# Patient Record
Sex: Female | Born: 1950 | Race: Black or African American | Hispanic: No | Smoking: Never smoker
Health system: Southern US, Community
[De-identification: ages and names within clinical notes are randomized; demographics above are authoritative.]

## PROBLEM LIST (undated history)

## (undated) DIAGNOSIS — I10 Essential (primary) hypertension: Secondary | ICD-10-CM

## (undated) DIAGNOSIS — K635 Polyp of colon: Secondary | ICD-10-CM

## (undated) DIAGNOSIS — R011 Cardiac murmur, unspecified: Secondary | ICD-10-CM

## (undated) DIAGNOSIS — C801 Malignant (primary) neoplasm, unspecified: Secondary | ICD-10-CM

## (undated) DIAGNOSIS — K5792 Diverticulitis of intestine, part unspecified, without perforation or abscess without bleeding: Secondary | ICD-10-CM

## (undated) DIAGNOSIS — K921 Melena: Secondary | ICD-10-CM

## (undated) HISTORY — DX: Polyp of colon: K63.5

## (undated) HISTORY — DX: Malignant (primary) neoplasm, unspecified: C80.1

## (undated) HISTORY — DX: Melena: K92.1

## (undated) HISTORY — DX: Essential (primary) hypertension: I10

## (undated) HISTORY — DX: Diverticulitis of intestine, part unspecified, without perforation or abscess without bleeding: K57.92

## (undated) HISTORY — DX: Cardiac murmur, unspecified: R01.1

---

## 1991-03-09 HISTORY — PX: ABDOMINAL HYSTERECTOMY: SHX81

## 2003-03-09 DIAGNOSIS — C801 Malignant (primary) neoplasm, unspecified: Secondary | ICD-10-CM

## 2003-03-09 HISTORY — DX: Malignant (primary) neoplasm, unspecified: C80.1

## 2003-03-09 HISTORY — PX: OTHER SURGICAL HISTORY: SHX169

## 2003-10-22 ENCOUNTER — Other Ambulatory Visit: Payer: Self-pay

## 2006-08-10 ENCOUNTER — Ambulatory Visit: Payer: Self-pay | Admitting: Family Medicine

## 2007-08-14 ENCOUNTER — Ambulatory Visit: Payer: Self-pay | Admitting: Family Medicine

## 2008-10-24 ENCOUNTER — Ambulatory Visit: Payer: Self-pay | Admitting: Family Medicine

## 2009-07-06 LAB — HM MAMMOGRAPHY: HM Mammogram: NORMAL

## 2009-10-27 ENCOUNTER — Ambulatory Visit: Payer: Self-pay | Admitting: Family Medicine

## 2011-09-06 ENCOUNTER — Ambulatory Visit (INDEPENDENT_AMBULATORY_CARE_PROVIDER_SITE_OTHER): Payer: BC Managed Care – PPO | Admitting: Internal Medicine

## 2011-09-06 ENCOUNTER — Encounter: Payer: Self-pay | Admitting: Internal Medicine

## 2011-09-06 VITALS — BP 140/90 | HR 79 | Temp 98.7°F | Ht 63.5 in | Wt 186.2 lb

## 2011-09-06 DIAGNOSIS — Z85048 Personal history of other malignant neoplasm of rectum, rectosigmoid junction, and anus: Secondary | ICD-10-CM | POA: Insufficient documentation

## 2011-09-06 DIAGNOSIS — Z0000 Encounter for general adult medical examination without abnormal findings: Secondary | ICD-10-CM

## 2011-09-06 DIAGNOSIS — G4700 Insomnia, unspecified: Secondary | ICD-10-CM | POA: Insufficient documentation

## 2011-09-06 DIAGNOSIS — Z1239 Encounter for other screening for malignant neoplasm of breast: Secondary | ICD-10-CM

## 2011-09-06 DIAGNOSIS — I10 Essential (primary) hypertension: Secondary | ICD-10-CM

## 2011-09-06 LAB — COMPREHENSIVE METABOLIC PANEL
Albumin: 4.4 g/dL (ref 3.5–5.2)
Alkaline Phosphatase: 45 U/L (ref 39–117)
BUN: 13 mg/dL (ref 6–23)
CO2: 28 mEq/L (ref 19–32)
Calcium: 9.7 mg/dL (ref 8.4–10.5)
GFR: 94.42 mL/min (ref >60.00)
Glucose, Bld: 103 mg/dL — ABNORMAL HIGH (ref 70–99)
Potassium: 3.8 mEq/L (ref 3.5–5.1)

## 2011-09-06 LAB — CBC WITH DIFFERENTIAL/PLATELET
Basophils Absolute: 0 10*3/uL (ref 0.0–0.1)
HCT: 44 % (ref 36.0–46.0)
Hemoglobin: 14.7 g/dL (ref 12.0–15.0)
Lymphs Abs: 3.3 10*3/uL (ref 0.7–4.0)
MCHC: 33.3 g/dL (ref 30.0–36.0)
MCV: 93.2 fl (ref 78.0–100.0)
Monocytes Absolute: 0.2 10*3/uL (ref 0.1–1.0)
Neutro Abs: 2.7 10*3/uL (ref 1.4–7.7)
Platelets: 239 10*3/uL (ref 150.0–400.0)
RDW: 13.9 % (ref 11.5–14.6)

## 2011-09-06 LAB — LIPID PANEL
Cholesterol: 224 mg/dL — ABNORMAL HIGH (ref 0–200)
Total CHOL/HDL Ratio: 2
Triglycerides: 87 mg/dL (ref 0.0–149.0)

## 2011-09-06 LAB — MICROALBUMIN / CREATININE URINE RATIO: Microalb, Ur: 0.4 mg/dL (ref 0.0–1.9)

## 2011-09-06 MED ORDER — AMLODIPINE-OLMESARTAN 5-40 MG PO TABS: 1.0000 | ORAL_TABLET | Freq: Every day | ORAL | Status: DC

## 2011-09-06 NOTE — Assessment & Plan Note (Signed)
Blood pressure slightly elevated today. We'll plan to continue amlodipine and omesartan for now. We'll check renal function with labs. Patient will followup in one month.

## 2011-09-06 NOTE — Assessment & Plan Note (Signed)
Encouraged increased physical activity during the day to help with sleep at night. Discussed potentially adding medication, but she would like to defer this for now.

## 2011-09-06 NOTE — Progress Notes (Signed)
Subjective:    Patient ID: Diamond Hill, female    DOB: January 28, 1951, 61 y.o.   MRN: [id number]  HPI 61 year old female with history of rectal cancer status post surgical resection and radiation therapy, hypertension, and insomnia presents to establish care. She reports that she is generally feeling well. She continues to struggle with difficulty falling asleep at night, but is working on improving relaxation skills to help with this. She had taken medication for sleep on one occasion in the past but prefer not to use medications to help with sleep.  In regards to her history of rectal cancer, she reports she is status post surgical resection and radiation therapy. She notes that she suffered from significant dermatitis after radiation therapy. However, at this point she is cancer free. She notes her last colonoscopy was 2012 was normal. She is followed on a routine basis by her oncologist at [facility]. She denies any incontinence of stool or pain with bowel movements.  In regards to her history of hypertension, she reports full compliance with her medication. She denies any noted side effects from the medicine.  Outpatient Encounter Prescriptions as of 09/06/2011  Medication Sig Dispense Refill  . amLODipine-olmesartan (AZOR) 5-40 MG per tablet Take 1 tablet by mouth daily.  30 tablet  6    Review of Systems  Constitutional: Negative for fever, chills, appetite change, fatigue and unexpected weight change.  HENT: Negative for ear pain, congestion, sore throat, trouble swallowing, neck pain, voice change and sinus pressure.   Eyes: Negative for visual disturbance.  Respiratory: Negative for cough, shortness of breath, wheezing and stridor.   Cardiovascular: Negative for chest pain, palpitations and leg swelling.  Gastrointestinal: Negative for nausea, vomiting, abdominal pain, diarrhea, constipation, blood in stool, abdominal distention and anal bleeding.  Genitourinary: Negative for  dysuria and flank pain.  Musculoskeletal: Negative for myalgias, arthralgias and gait problem.  Skin: Negative for color change and rash.  Neurological: Negative for dizziness and headaches.  Hematological: Negative for adenopathy. Does not bruise/bleed easily.  Psychiatric/Behavioral: Positive for disturbed wake/sleep cycle. Negative for suicidal ideas and dysphoric mood. The patient is not nervous/anxious.    BP 140/90  Pulse 79  Temp 98.7 F (37.1 C) (Oral)  Ht 5' 3.5" (1.613 m)  Wt 186 lb 4 oz (84.482 kg)  BMI 32.48 kg/m2  SpO2 98%     Objective:   Physical Exam  Constitutional: She is oriented to person, place, and time. She appears well-developed and well-nourished. No distress.  HENT:  Head: Normocephalic and atraumatic.  Right Ear: External ear normal.  Left Ear: External ear normal.  Nose: Nose normal.  Mouth/Throat: Oropharynx is clear and moist. No oropharyngeal exudate.  Eyes: Conjunctivae are normal. Pupils are equal, round, and reactive to light. Right eye exhibits no discharge. Left eye exhibits no discharge. No scleral icterus.  Neck: Normal range of motion. Neck supple. No tracheal deviation present. No thyromegaly present.  Cardiovascular: Normal rate, regular rhythm, normal heart sounds and intact distal pulses.  Exam reveals no gallop and no friction rub.   No murmur heard. Pulmonary/Chest: Effort normal and breath sounds normal. No respiratory distress. She has no wheezes. She has no rales. She exhibits no tenderness.  Abdominal: Soft. Bowel sounds are normal. She exhibits no distension and no mass. There is no tenderness. There is no guarding.  Musculoskeletal: Normal range of motion. She exhibits no edema and no tenderness.  Lymphadenopathy:    She has no cervical adenopathy.  Neurological: She is  alert and oriented to person, place, and time. No cranial nerve deficit. She exhibits normal muscle tone. Coordination normal.  Skin: Skin is warm and dry. No rash  noted. She is not diaphoretic. No erythema. No pallor.  Psychiatric: She has a normal mood and affect. Her behavior is normal. Judgment and thought content normal.          Assessment & Plan:

## 2011-09-07 DIAGNOSIS — C20 Malignant neoplasm of rectum: Secondary | ICD-10-CM | POA: Insufficient documentation

## 2011-09-08 ENCOUNTER — Ambulatory Visit: Payer: Self-pay | Admitting: Internal Medicine

## 2011-09-15 ENCOUNTER — Encounter: Payer: Self-pay | Admitting: Internal Medicine

## 2011-10-12 ENCOUNTER — Encounter: Payer: Self-pay | Admitting: Internal Medicine

## 2011-10-12 ENCOUNTER — Ambulatory Visit (INDEPENDENT_AMBULATORY_CARE_PROVIDER_SITE_OTHER): Payer: BC Managed Care – PPO | Admitting: Internal Medicine

## 2011-10-12 ENCOUNTER — Other Ambulatory Visit (HOSPITAL_COMMUNITY)
Admission: RE | Admit: 2011-10-12 | Discharge: 2011-10-12 | Disposition: A | Discharge status: Home / Self Care | Payer: BC Managed Care – PPO | Source: Ambulatory Visit | Attending: Internal Medicine | Admitting: Internal Medicine

## 2011-10-12 VITALS — BP 140/80 | HR 74 | Temp 98.7°F | Ht 63.5 in | Wt 185.2 lb

## 2011-10-12 DIAGNOSIS — Z124 Encounter for screening for malignant neoplasm of cervix: Secondary | ICD-10-CM

## 2011-10-12 DIAGNOSIS — Z1159 Encounter for screening for other viral diseases: Secondary | ICD-10-CM | POA: Insufficient documentation

## 2011-10-12 DIAGNOSIS — R87810 Cervical high risk human papillomavirus (HPV) DNA test positive: Secondary | ICD-10-CM | POA: Insufficient documentation

## 2011-10-12 DIAGNOSIS — Z0000 Encounter for general adult medical examination without abnormal findings: Secondary | ICD-10-CM

## 2011-10-12 DIAGNOSIS — Z01419 Encounter for gynecological examination (general) (routine) without abnormal findings: Secondary | ICD-10-CM | POA: Insufficient documentation

## 2011-10-12 NOTE — Assessment & Plan Note (Addendum)
General exam including breast and pelvic exam are normal today. Pap is pending. Health maintenance is up to date with the exception of tetanus shot, which patient declines today. Reviewed recent lab work including lipid profile which was normal. Encourage continued efforts at healthy diet and regular physical activity including walking. Patient will followup in 6 months or sooner as needed.

## 2011-10-12 NOTE — Progress Notes (Signed)
Subjective:    Patient ID: Diamond Hill, female    DOB: Jan 28, 1951, 61 y.o.   MRN: [id number]  HPI 61 year old female with history of hypertension presents for annual exam. She reports she has been doing well. She has been walking approximately 2 miles per day. She has been trying to make healthy diet choices. She reports full compliance with her medication. She has no new concerns today.  Outpatient Encounter Prescriptions as of 10/12/2011  Medication Sig Dispense Refill  . amLODipine-olmesartan (AZOR) 5-40 MG per tablet Take 1 tablet by mouth daily.  30 tablet  6   BP 140/80  Pulse 74  Temp 98.7 F (37.1 C) (Oral)  Ht 5' 3.5" (1.613 m)  Wt 185 lb 4 oz (84.029 kg)  BMI 32.30 kg/m2  SpO2 99%  Review of Systems  Constitutional: Negative for fever, chills, appetite change, fatigue and unexpected weight change.  HENT: Negative for ear pain, congestion, sore throat, trouble swallowing, neck pain, voice change and sinus pressure.   Eyes: Negative for visual disturbance.  Respiratory: Negative for cough, shortness of breath, wheezing and stridor.   Cardiovascular: Negative for chest pain, palpitations and leg swelling.  Gastrointestinal: Negative for nausea, vomiting, abdominal pain, diarrhea, constipation, blood in stool, abdominal distention and anal bleeding.  Genitourinary: Negative for dysuria and flank pain.  Musculoskeletal: Negative for myalgias, arthralgias and gait problem.  Skin: Negative for color change and rash.  Neurological: Negative for dizziness and headaches.  Hematological: Negative for adenopathy. Does not bruise/bleed easily.  Psychiatric/Behavioral: Negative for suicidal ideas, disturbed wake/sleep cycle and dysphoric mood. The patient is not nervous/anxious.        Objective:   Physical Exam  Constitutional: She is oriented to person, place, and time. She appears well-developed and well-nourished. No distress.  HENT:  Head: Normocephalic and atraumatic.    Right Ear: External ear normal.  Left Ear: External ear normal.  Nose: Nose normal.  Mouth/Throat: Oropharynx is clear and moist. No oropharyngeal exudate.  Eyes: Conjunctivae are normal. Pupils are equal, round, and reactive to light. Right eye exhibits no discharge. Left eye exhibits no discharge. No scleral icterus.  Neck: Normal range of motion. Neck supple. No tracheal deviation present. No thyromegaly present.  Cardiovascular: Normal rate, regular rhythm, normal heart sounds and intact distal pulses.  Exam reveals no gallop and no friction rub.   No murmur heard. Pulmonary/Chest: Effort normal and breath sounds normal. No respiratory distress. She has no wheezes. She has no rales. She exhibits no tenderness.  Abdominal: Soft. Bowel sounds are normal. She exhibits no distension and no mass. There is no tenderness. There is no rebound and no guarding.  Genitourinary: Rectum normal. No breast swelling, tenderness, discharge or bleeding. Pelvic exam was performed with patient supine. There is no rash, tenderness or lesion on the right labia. There is no rash, tenderness or lesion on the left labia. Right adnexum displays no mass, no tenderness and no fullness. Left adnexum displays no mass, no tenderness and no fullness. No erythema or tenderness around the vagina. Vaginal discharge found.       Uterus and cervix surgically absent  Musculoskeletal: Normal range of motion. She exhibits no edema and no tenderness.  Lymphadenopathy:    She has no cervical adenopathy.  Neurological: She is alert and oriented to person, place, and time. No cranial nerve deficit. She exhibits normal muscle tone. Coordination normal.  Skin: Skin is warm and dry. No rash noted. She is not diaphoretic. No erythema. No pallor.  Psychiatric: She has a normal mood and affect. Her behavior is normal. Judgment and thought content normal.          Assessment & Plan:

## 2011-10-14 ENCOUNTER — Encounter: Payer: BC Managed Care – PPO | Admitting: Internal Medicine

## 2012-02-22 ENCOUNTER — Telehealth: Payer: Self-pay | Admitting: Internal Medicine

## 2012-02-22 MED ORDER — OSELTAMIVIR PHOSPHATE 75 MG PO CAPS: 75.0000 mg | ORAL_CAPSULE | Freq: Two times a day (BID) | ORAL | Status: DC

## 2012-02-22 NOTE — Telephone Encounter (Signed)
Please advise 

## 2012-02-22 NOTE — Telephone Encounter (Signed)
Patient Information:  Caller Name: [facility]  Phone: [phone]  Patient: Diamond, Hill  Gender: Female  DOB: [birth year]  Age: 61 Years  PCP: Ronna Polio (Adults only)  Office Follow Up:  Does the office need to follow up with this patient?: Yes  Instructions For The Office: Flu symptoms; requests tamiflu or appt; no appts available krs/can  RN Note:  Per protocol, emergent symptoms denied; patient requesting Tamiflu or appt.  No appts available per Epic; info to office for provider review/Rx/callback.  Uses [facility].  May reach patient [phone] krs/can  Symptoms  Reason For Call & Symptoms: flu; temp to 102, chills, cough, body aches  Reviewed Health History In EMR: Yes  Reviewed Medications In EMR: Yes  Reviewed Allergies In EMR: Yes  Reviewed Surgeries / Procedures: Yes  Date of Onset of Symptoms: 02/21/2012  Any Fever: Yes  Fever Taken: Oral  Fever Time Of Reading: 10:00:00  Fever Last Reading: 101.8  Guideline(s) Used:  Influenza - Seasonal  Disposition Per Guideline:   Discuss with PCP and Callback by Nurse Today  Reason For Disposition Reached:   Patient requests antiviral medicine for influenza and flu symptoms present < 48 hours  Advice Given:  N/A

## 2012-02-22 NOTE — Telephone Encounter (Signed)
Pt notified and advised to make an appt if symptoms persist.

## 2012-02-22 NOTE — Telephone Encounter (Signed)
I have called in [place] given that symptoms are consistent with flu, and we have no open appointments. Pt will need to be seen later this week if symptoms are persistent. If this is flu, Tamiflu should shorten the course and reduce risk of transmission to others.

## 2012-04-13 ENCOUNTER — Encounter: Payer: Self-pay | Admitting: Internal Medicine

## 2012-04-13 ENCOUNTER — Ambulatory Visit (INDEPENDENT_AMBULATORY_CARE_PROVIDER_SITE_OTHER): Payer: BC Managed Care – PPO | Admitting: Internal Medicine

## 2012-04-13 VITALS — BP 160/98 | HR 76 | Temp 98.3°F | Wt 185.0 lb

## 2012-04-13 DIAGNOSIS — F419 Anxiety disorder, unspecified: Secondary | ICD-10-CM

## 2012-04-13 DIAGNOSIS — IMO0002 Reserved for concepts with insufficient information to code with codable children: Secondary | ICD-10-CM

## 2012-04-13 DIAGNOSIS — I10 Essential (primary) hypertension: Secondary | ICD-10-CM

## 2012-04-13 DIAGNOSIS — R6889 Other general symptoms and signs: Secondary | ICD-10-CM

## 2012-04-13 DIAGNOSIS — B977 Papillomavirus as the cause of diseases classified elsewhere: Secondary | ICD-10-CM

## 2012-04-13 DIAGNOSIS — F411 Generalized anxiety disorder: Secondary | ICD-10-CM

## 2012-04-13 DIAGNOSIS — F32A Depression, unspecified: Secondary | ICD-10-CM | POA: Insufficient documentation

## 2012-04-13 LAB — COMPREHENSIVE METABOLIC PANEL
Albumin: 4.1 g/dL (ref 3.5–5.2)
BUN: 11 mg/dL (ref 6–23)
CO2: 28 mEq/L (ref 19–32)
Calcium: 9 mg/dL (ref 8.4–10.5)
Chloride: 104 mEq/L (ref 96–112)
Glucose, Bld: 83 mg/dL (ref 70–99)
Potassium: 3.7 mEq/L (ref 3.5–5.1)

## 2012-04-13 NOTE — Assessment & Plan Note (Signed)
Symptoms of mild anxiety related to interactions with pt daughter. Offered support today. Follow up prn.

## 2012-04-13 NOTE — Assessment & Plan Note (Signed)
Discussed PAP and HPV testing. Plan for repeat PAP in 10/2012.

## 2012-04-13 NOTE — Assessment & Plan Note (Signed)
BP elevated today. 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 Compliant with meds. Will recheck BP in 1 week. Check renal function today with labs.  Follow up for physical in 10/2012.

## 2012-04-13 NOTE — Progress Notes (Signed)
  Subjective:    Patient ID: Diamond Hill, female    DOB: [birth year], 62 y.o.   MRN: [id number]  HPI 62YO female with HTN presents for follow up. Doing well. Notes some increased anxiety recently with interaction with her daughter. Coping well. Exercising several times per week using elliptical. No recent headache, chest pain, palp. Compliant with meds. BP has been well controlled when checked by nurse at her office.  Outpatient Encounter Prescriptions as of 04/13/2012  Medication Sig Dispense Refill  . amLODipine-olmesartan (AZOR) 5-40 MG per tablet Take 1 tablet by mouth daily.  30 tablet  6  . [DISCONTINUED] oseltamivir (TAMIFLU) 75 MG capsule Take 1 capsule (75 mg total) by mouth 2 (two) times daily.  10 capsule  0   BP 160/98  Pulse 76  Temp 98.3 F (36.8 C) (Oral)  Wt 185 lb (83.915 kg)  SpO2 98%  Review of Systems  Constitutional: Negative for fever, chills, appetite change, fatigue and unexpected weight change.  HENT: Negative for ear pain, congestion, sore throat, trouble swallowing, neck pain, voice change and sinus pressure.   Eyes: Negative for visual disturbance.  Respiratory: Negative for cough, shortness of breath, wheezing and stridor.   Cardiovascular: Negative for chest pain, palpitations and leg swelling.  Gastrointestinal: Negative for nausea, vomiting, abdominal pain, diarrhea, constipation, blood in stool, abdominal distention and anal bleeding.  Genitourinary: Negative for dysuria and flank pain.  Musculoskeletal: Negative for myalgias, arthralgias and gait problem.  Skin: Negative for color change and rash.  Neurological: Negative for dizziness and headaches.  Hematological: Negative for adenopathy. Does not bruise/bleed easily.  Psychiatric/Behavioral: Negative for suicidal ideas, sleep disturbance and dysphoric mood. The patient is nervous/anxious.        Objective:   Physical Exam  Constitutional: She is oriented to person, place, and time. She appears  well-developed and well-nourished. No distress.  HENT:  Head: Normocephalic and atraumatic.  Right Ear: External ear normal.  Left Ear: External ear normal.  Nose: Nose normal.  Mouth/Throat: Oropharynx is clear and moist. No oropharyngeal exudate.  Eyes: Conjunctivae normal are normal. Pupils are equal, round, and reactive to light. Right eye exhibits no discharge. Left eye exhibits no discharge. No scleral icterus.  Neck: Normal range of motion. Neck supple. No tracheal deviation present. No thyromegaly present.  Cardiovascular: Normal rate, regular rhythm, normal heart sounds and intact distal pulses.  Exam reveals no gallop and no friction rub.   No murmur heard. Pulmonary/Chest: Effort normal and breath sounds normal. No respiratory distress. She has no wheezes. She has no rales. She exhibits no tenderness.  Musculoskeletal: Normal range of motion. She exhibits no edema and no tenderness.  Lymphadenopathy:    She has no cervical adenopathy.  Neurological: She is alert and oriented to person, place, and time. No cranial nerve deficit. She exhibits normal muscle tone. Coordination normal.  Skin: Skin is warm and dry. No rash noted. She is not diaphoretic. No erythema. No pallor.  Psychiatric: She has a normal mood and affect. Her behavior is normal. Judgment and thought content normal.          Assessment & Plan:

## 2012-04-18 ENCOUNTER — Other Ambulatory Visit: Payer: Self-pay | Admitting: *Deleted

## 2012-04-18 DIAGNOSIS — I10 Essential (primary) hypertension: Secondary | ICD-10-CM

## 2012-04-18 MED ORDER — AMLODIPINE-OLMESARTAN 5-40 MG PO TABS: 1.0000 | ORAL_TABLET | Freq: Every day | ORAL | Status: DC

## 2012-04-22 ENCOUNTER — Other Ambulatory Visit: Payer: Self-pay

## 2012-10-12 ENCOUNTER — Encounter: Payer: Self-pay | Admitting: Internal Medicine

## 2012-10-12 ENCOUNTER — Ambulatory Visit (INDEPENDENT_AMBULATORY_CARE_PROVIDER_SITE_OTHER): Payer: BC Managed Care – PPO | Admitting: Internal Medicine

## 2012-10-12 ENCOUNTER — Other Ambulatory Visit (HOSPITAL_COMMUNITY)
Admission: RE | Admit: 2012-10-12 | Discharge: 2012-10-12 | Disposition: A | Discharge status: Home / Self Care | Payer: BC Managed Care – PPO | Source: Ambulatory Visit | Attending: Internal Medicine | Admitting: Internal Medicine

## 2012-10-12 VITALS — BP 150/106 | HR 75 | Temp 98.1°F | Ht 63.5 in | Wt 187.0 lb

## 2012-10-12 DIAGNOSIS — B977 Papillomavirus as the cause of diseases classified elsewhere: Secondary | ICD-10-CM

## 2012-10-12 DIAGNOSIS — R6889 Other general symptoms and signs: Secondary | ICD-10-CM

## 2012-10-12 DIAGNOSIS — Z01419 Encounter for gynecological examination (general) (routine) without abnormal findings: Secondary | ICD-10-CM | POA: Insufficient documentation

## 2012-10-12 DIAGNOSIS — Z23 Encounter for immunization: Secondary | ICD-10-CM

## 2012-10-12 DIAGNOSIS — IMO0002 Reserved for concepts with insufficient information to code with codable children: Secondary | ICD-10-CM

## 2012-10-12 DIAGNOSIS — R599 Enlarged lymph nodes, unspecified: Secondary | ICD-10-CM

## 2012-10-12 DIAGNOSIS — Z1151 Encounter for screening for human papillomavirus (HPV): Secondary | ICD-10-CM | POA: Insufficient documentation

## 2012-10-12 DIAGNOSIS — R590 Localized enlarged lymph nodes: Secondary | ICD-10-CM | POA: Insufficient documentation

## 2012-10-12 DIAGNOSIS — Z0000 Encounter for general adult medical examination without abnormal findings: Secondary | ICD-10-CM

## 2012-10-12 DIAGNOSIS — I10 Essential (primary) hypertension: Secondary | ICD-10-CM

## 2012-10-12 LAB — LIPID PANEL
Cholesterol: 193 mg/dL (ref 0–200)
HDL: 97.8 mg/dL (ref >39.00)
LDL Cholesterol: 87 mg/dL (ref 0–99)
Total CHOL/HDL Ratio: 2
Triglycerides: 40 mg/dL (ref 0.0–149.0)
VLDL: 8 mg/dL (ref 0.0–40.0)

## 2012-10-12 LAB — CBC WITH DIFFERENTIAL/PLATELET
Basophils Relative: 0.4 % (ref 0.0–3.0)
Eosinophils Absolute: 0.1 10*3/uL (ref 0.0–0.7)
HCT: 43.1 % (ref 36.0–46.0)
Hemoglobin: 14.2 g/dL (ref 12.0–15.0)
Lymphs Abs: 2.8 10*3/uL (ref 0.7–4.0)
MCHC: 32.9 g/dL (ref 30.0–36.0)
MCV: 93.2 fl (ref 78.0–100.0)
Monocytes Absolute: 0.4 10*3/uL (ref 0.1–1.0)
Neutro Abs: 2.4 10*3/uL (ref 1.4–7.7)
RBC: 4.62 Mil/uL (ref 3.87–5.11)

## 2012-10-12 LAB — MICROALBUMIN / CREATININE URINE RATIO
Creatinine,U: 27 mg/dL
Microalb Creat Ratio: 0.7 mg/g (ref 0.0–30.0)
Microalb, Ur: 0.2 mg/dL (ref 0.0–1.9)

## 2012-10-12 LAB — COMPREHENSIVE METABOLIC PANEL
AST: 17 U/L (ref 0–37)
Albumin: 4.1 g/dL (ref 3.5–5.2)
Alkaline Phosphatase: 47 U/L (ref 39–117)
Potassium: 4 mEq/L (ref 3.5–5.1)
Sodium: 142 mEq/L (ref 135–145)
Total Protein: 7.3 g/dL (ref 6.0–8.3)

## 2012-10-12 MED ORDER — AMLODIPINE-OLMESARTAN 5-40 MG PO TABS: 1.0000 | ORAL_TABLET | Freq: Every day | ORAL | Status: DC

## 2012-10-12 NOTE — Assessment & Plan Note (Signed)
BP Readings from Last 3 Encounters:  10/12/12 150/106  04/13/12 160/98  10/12/11 140/80   Blood pressure elevated today however patient reports well-controlled at home. Will have her monitor at home and call if consistently greater than 140/90. Will continue amlodipine olmesartan. Will check renal function including urine micro-albumin with labs today.

## 2012-10-12 NOTE — Assessment & Plan Note (Signed)
Repeat Pap smear with HPV testing sent today.

## 2012-10-12 NOTE — Assessment & Plan Note (Signed)
General medical exam including breast and pelvic exam normal today except as noted. Pap is pending. Mammogram is up-to-date. Colonoscopy up-to-date. Encouraged efforts at healthy diet and regular physical activity with goal of weight loss. Tdap given today. Will check labs including CBC, CMP, lipids, TSH, Vit D. Follow up 6 months and prn.

## 2012-10-12 NOTE — Assessment & Plan Note (Signed)
Right-sided cervical adenopathy noted on exam today. Patient reports has been stable for several years however given her history of malignancy, will set up ENT evaluation for possible biopsy.

## 2012-10-12 NOTE — Progress Notes (Signed)
Subjective:    Patient ID: Diamond Hill, female    DOB: 01-28-1951, 62 y.o.   MRN: [id number]  HPI 62YO female with h/o hypertension, colorectal cancer presents for annual exam. Generally feeling well. Reports BP much better controlled at home, readings less than 140/90. No chest pain, palpitations, headache. Compliant with medications. Due for mammogram. Trying to follow a healthy diet and get regular physical activity.  Concerned about enlarged lymph node in right neck. Present for >1 year. Not changing in size. Has not been evaluated by oncologist. Not tender to palpation. No recent illnesses.  Outpatient Encounter Prescriptions as of 10/12/2012  Medication Sig Dispense Refill  . amLODipine-olmesartan (AZOR) 5-40 MG per tablet Take 1 tablet by mouth daily.  30 tablet  6   No facility-administered encounter medications on file as of 10/12/2012.   BP 150/106  Pulse 75  Temp(Src) 98.1 F (36.7 C) (Oral)  Ht 5' 3.5" (1.613 m)  Wt 187 lb (84.823 kg)  BMI 32.6 kg/m2  SpO2 97%  Review of Systems  Constitutional: Negative for fever, chills, appetite change, fatigue and unexpected weight change.  HENT: Negative for ear pain, congestion, sore throat, trouble swallowing, neck pain, voice change and sinus pressure.   Eyes: Negative for visual disturbance.  Respiratory: Negative for cough, shortness of breath, wheezing and stridor.   Cardiovascular: Negative for chest pain, palpitations and leg swelling.  Gastrointestinal: Negative for nausea, vomiting, abdominal pain, diarrhea, constipation, blood in stool, abdominal distention and anal bleeding.  Genitourinary: Negative for dysuria and flank pain.  Musculoskeletal: Negative for myalgias, arthralgias and gait problem.  Skin: Negative for color change and rash.  Neurological: Negative for dizziness and headaches.  Hematological: Positive for adenopathy. Does not bruise/bleed easily.  Psychiatric/Behavioral: Negative for suicidal ideas, sleep  disturbance and dysphoric mood. The patient is not nervous/anxious.        Objective:   Physical Exam  Constitutional: She is oriented to person, place, and time. She appears well-developed and well-nourished. No distress.  HENT:  Head: Normocephalic and atraumatic.  Right Ear: External ear normal.  Left Ear: External ear normal.  Nose: Nose normal.  Mouth/Throat: Oropharynx is clear and moist. No oropharyngeal exudate.  Eyes: Conjunctivae are normal. Pupils are equal, round, and reactive to light. Right eye exhibits no discharge. Left eye exhibits no discharge. No scleral icterus.  Neck: Normal range of motion. Neck supple. No tracheal deviation present. No thyromegaly present.    Cardiovascular: Normal rate, regular rhythm, normal heart sounds and intact distal pulses.  Exam reveals no gallop and no friction rub.   No murmur heard. Pulmonary/Chest: Effort normal and breath sounds normal. No respiratory distress. She has no wheezes. She has no rales. She exhibits no tenderness.  Abdominal: Soft. Bowel sounds are normal. She exhibits no distension and no mass. There is no tenderness. There is no rebound and no guarding.  Genitourinary: Rectum normal, vagina normal and uterus normal. No breast swelling, tenderness, discharge or bleeding. Pelvic exam was performed with patient supine. There is no rash, tenderness or lesion on the right labia. There is no rash, tenderness or lesion on the left labia. Uterus is not enlarged and not tender. Cervix exhibits friability. Cervix exhibits no motion tenderness and no discharge. Right adnexum displays no mass, no tenderness and no fullness. Left adnexum displays no mass, no tenderness and no fullness. No erythema or tenderness around the vagina. No vaginal discharge found.  Musculoskeletal: Normal range of motion. She exhibits no edema and no tenderness.  Lymphadenopathy:    She has cervical adenopathy.       Right cervical: Superficial cervical  adenopathy present.  Neurological: She is alert and oriented to person, place, and time. No cranial nerve deficit. She exhibits normal muscle tone. Coordination normal.  Skin: Skin is warm and dry. No rash noted. She is not diaphoretic. No erythema. No pallor.  Psychiatric: She has a normal mood and affect. Her behavior is normal. Judgment and thought content normal.          Assessment & Plan:

## 2012-10-24 ENCOUNTER — Encounter: Payer: Self-pay | Admitting: *Deleted

## 2013-01-11 ENCOUNTER — Other Ambulatory Visit: Payer: Self-pay

## 2013-02-20 ENCOUNTER — Ambulatory Visit: Payer: Self-pay | Admitting: Unknown Physician Specialty

## 2013-03-21 ENCOUNTER — Ambulatory Visit: Payer: Self-pay | Admitting: Unknown Physician Specialty

## 2013-03-21 ENCOUNTER — Telehealth: Payer: Self-pay | Admitting: *Deleted

## 2013-03-21 NOTE — Telephone Encounter (Signed)
Called [phone] for prior authorization on the [place], [place] request form is being faxed over

## 2013-03-22 NOTE — Telephone Encounter (Signed)
  Received PA request form place in Dr.walkers folder

## 2013-03-26 ENCOUNTER — Ambulatory Visit: Payer: Self-pay | Admitting: Unknown Physician Specialty

## 2013-03-29 LAB — PATHOLOGY REPORT

## 2013-04-03 ENCOUNTER — Other Ambulatory Visit: Payer: Self-pay | Admitting: *Deleted

## 2013-04-03 NOTE — Telephone Encounter (Signed)
Called for PA request form again, received fax placed in [facility] box

## 2013-04-04 NOTE — Telephone Encounter (Signed)
Do you recall completing PA for patient medication? I did give her samples until this is resolved. Informed her a PA could take up to 2 weeks depending on her insurance company.

## 2013-04-04 NOTE — Telephone Encounter (Signed)
I gave this back in my folder tonight. She will likely need to get generic Amlodpine and then generic Losartan, but this will be a change. We need to know what drugs her insurance will cover.

## 2013-04-04 NOTE — Telephone Encounter (Signed)
Pt came in today checking on her rx.  Pt stated she is completely out of her meds and has been without her meds for 4 days Please call pt with status of her rx

## 2013-04-05 NOTE — Telephone Encounter (Signed)
Left message for patient to call office back to schedule an appointment according to the note left by Dr. Gilford Rile

## 2013-04-05 NOTE — Telephone Encounter (Signed)
Patient will need to schedule an appointment.

## 2013-04-06 ENCOUNTER — Telehealth: Payer: Self-pay | Admitting: Internal Medicine

## 2013-04-06 NOTE — Telephone Encounter (Signed)
Returned patients call  From voice mail left 1/27  Left message for pt to call office

## 2013-04-27 ENCOUNTER — Encounter: Payer: Self-pay | Admitting: Internal Medicine

## 2013-04-27 ENCOUNTER — Ambulatory Visit (INDEPENDENT_AMBULATORY_CARE_PROVIDER_SITE_OTHER): Payer: 59 | Admitting: Internal Medicine

## 2013-04-27 VITALS — BP 140/86 | HR 75 | Temp 97.9°F | Wt 184.0 lb

## 2013-04-27 DIAGNOSIS — R590 Localized enlarged lymph nodes: Secondary | ICD-10-CM

## 2013-04-27 DIAGNOSIS — I10 Essential (primary) hypertension: Secondary | ICD-10-CM

## 2013-04-27 DIAGNOSIS — R599 Enlarged lymph nodes, unspecified: Secondary | ICD-10-CM

## 2013-04-27 MED ORDER — AMLODIPINE BESYLATE 5 MG PO TABS: 5.0000 mg | ORAL_TABLET | Freq: Every day | ORAL | Status: DC

## 2013-04-27 MED ORDER — LOSARTAN POTASSIUM 50 MG PO TABS: 50.0000 mg | ORAL_TABLET | Freq: Every day | ORAL | Status: DC

## 2013-04-27 NOTE — Progress Notes (Signed)
Subjective:    Patient ID: Diamond Hill, female    DOB: [birth year], 63 y.o.   MRN: [id number]  HPI 63YO female presents for follow up. Was informed that her insurance will no longer cover Azor. Would like to change to alternative medication. Does not regularly check BP. No headache, chest pain, dyspnea, palpitations noted. No other concerns today.  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 She was told that pathology was benign. She reports biopsy site has healed well.   Review of Systems  Constitutional: Negative for fever, chills, appetite change, fatigue and unexpected weight change.  HENT: Negative for congestion, ear pain, sinus pressure, sore throat, trouble swallowing and voice change.   Eyes: Negative for visual disturbance.  Respiratory: Negative for cough, shortness of breath, wheezing and stridor.   Cardiovascular: Negative for chest pain, palpitations and leg swelling.  Gastrointestinal: Negative for nausea, vomiting, abdominal pain, diarrhea, constipation, blood in stool, abdominal distention and anal bleeding.  Genitourinary: Negative for dysuria and flank pain.  Musculoskeletal: Negative for arthralgias, gait problem, myalgias and neck pain.  Skin: Negative for color change and rash.  Neurological: Negative for dizziness and headaches.  Hematological: Negative for adenopathy. Does not bruise/bleed easily.  Psychiatric/Behavioral: Negative for suicidal ideas, sleep disturbance and dysphoric mood. The patient is not nervous/anxious.        Objective:    BP 140/86  Pulse 75  Temp(Src) 97.9 F (36.6 C) (Oral)  Wt 184 lb (83.462 kg)  SpO2 98% Physical Exam  Constitutional: She is oriented to person, place, and time. She appears well-developed and well-nourished. No distress.  HENT:  Head: Normocephalic and atraumatic.  Right Ear: External ear normal.  Left Ear: External ear normal.  Nose: Nose normal.  Mouth/Throat:  Oropharynx is clear and moist. No oropharyngeal exudate.  Eyes: Conjunctivae are normal. Pupils are equal, round, and reactive to light. Right eye exhibits no discharge. Left eye exhibits no discharge. No scleral icterus.  Neck: Normal range of motion. Neck supple. No tracheal deviation present. No thyromegaly present.    Cardiovascular: Normal rate, regular rhythm, normal heart sounds and intact distal pulses.  Exam reveals no gallop and no friction rub.   No murmur heard. Pulmonary/Chest: Effort normal and breath sounds normal. No accessory muscle usage. Not tachypneic. No respiratory distress. She has no decreased breath sounds. She has no wheezes. She has no rhonchi. She has no rales. She exhibits no tenderness.  Musculoskeletal: Normal range of motion. She exhibits no edema and no tenderness.  Lymphadenopathy:    She has no cervical adenopathy.  Neurological: She is alert and oriented to person, place, and time. No cranial nerve deficit. She exhibits normal muscle tone. Coordination normal.  Skin: Skin is warm and dry. No rash noted. She is not diaphoretic. No erythema. No pallor.  Psychiatric: She has a normal mood and affect. Her behavior is normal. Judgment and thought content normal.          Assessment & Plan:   Problem List Items Addressed This Visit   Cervical adenopathy     S/p recent biopsy of cervical adenopathy. Will request notes on biopsy and pathology report.    Hypertension - Primary      BP Readings from Last 3 Encounters:  04/27/13 140/86  10/12/12 150/106  04/13/12 160/98   Will stop Azor and change to Amlodipine and Losartan. Repeat Cr and K in 1 week. Repeat BP check in 1 week.  Relevant Medications      amLODIpine (NORVASC) tablet      losartan (COZAAR) tablet   Other Relevant Orders      Basic Metabolic Panel (BMET)       Return in about 6 months (around 10/25/2013) for Physical.

## 2013-04-27 NOTE — Assessment & Plan Note (Signed)
S/p recent biopsy of cervical adenopathy. Will request notes on biopsy and pathology report.

## 2013-04-27 NOTE — Progress Notes (Signed)
Pre visit review using our clinic review tool, if applicable. No additional management support is needed unless otherwise documented below in the visit note. 

## 2013-04-27 NOTE — Assessment & Plan Note (Signed)
BP Readings from Last 3 Encounters:  04/27/13 140/86  10/12/12 150/106  04/13/12 160/98   Will stop Azor and change to Amlodipine and Losartan. Repeat Cr and K in 1 week. Repeat BP check in 1 week.

## 2013-04-27 NOTE — Patient Instructions (Signed)
Stop Azor.  Start Amlodipine and Losartan daily.  Recheck labs next Friday.  Follow up in 10/2013.

## 2013-04-30 ENCOUNTER — Telehealth: Payer: Self-pay | Admitting: Internal Medicine

## 2013-04-30 NOTE — Telephone Encounter (Signed)
Relevant patient education assigned to patient using Emmi. ° °

## 2013-05-04 ENCOUNTER — Other Ambulatory Visit: Payer: 59

## 2013-09-04 ENCOUNTER — Ambulatory Visit (INDEPENDENT_AMBULATORY_CARE_PROVIDER_SITE_OTHER): Payer: 59 | Admitting: Internal Medicine

## 2013-09-04 ENCOUNTER — Encounter: Payer: Self-pay | Admitting: Internal Medicine

## 2013-09-04 VITALS — BP 158/96 | HR 89 | Temp 97.7°F | Resp 18 | Ht 63.0 in | Wt 186.5 lb

## 2013-09-04 DIAGNOSIS — N390 Urinary tract infection, site not specified: Secondary | ICD-10-CM | POA: Insufficient documentation

## 2013-09-04 LAB — POCT URINALYSIS DIPSTICK
Bilirubin, UA: NEGATIVE
Glucose, UA: NEGATIVE
Ketones, UA: NEGATIVE
Nitrite, UA: NEGATIVE
Protein, UA: NEGATIVE
Urobilinogen, UA: 0.2
pH, UA: 6

## 2013-09-04 MED ORDER — CIPROFLOXACIN HCL 500 MG PO TABS: 500.0000 mg | ORAL_TABLET | Freq: Two times a day (BID) | ORAL | Status: DC

## 2013-09-04 NOTE — Progress Notes (Signed)
   Subjective:    Patient ID: Diamond Hill, female    DOB: 1950/09/12, 63 y.o.   MRN: [id number]  HPI 62YO female presents for acute visit dysuria.  Dysuria - Last week, developed pain with urination. Noted some blood in urine. No fever. No flank pain. Mild low back pain.  Review of Systems  Constitutional: Negative for fever, chills and fatigue.  Gastrointestinal: Negative for nausea, vomiting, abdominal pain, diarrhea, constipation and rectal pain.  Genitourinary: Positive for dysuria, hematuria and pelvic pain. Negative for urgency, frequency, flank pain, decreased urine volume, vaginal bleeding, vaginal discharge, difficulty urinating and vaginal pain.  Musculoskeletal: Positive for back pain (low back).       Objective:    BP 158/96  Pulse 89  Temp(Src) 97.7 F (36.5 C) (Oral)  Resp 18  Ht 5\' 3"  (1.6 m)  Wt 186 lb 8 oz (84.596 kg)  BMI 33.05 kg/m2  SpO2 99% Physical Exam  Constitutional: She is oriented to person, place, and time. She appears well-developed and well-nourished. No distress.  HENT:  Head: Normocephalic and atraumatic.  Right Ear: External ear normal.  Left Ear: External ear normal.  Nose: Nose normal.  Mouth/Throat: Oropharynx is clear and moist. No oropharyngeal exudate.  Eyes: Conjunctivae are normal. Pupils are equal, round, and reactive to light. Right eye exhibits no discharge. Left eye exhibits no discharge. No scleral icterus.  Neck: Normal range of motion. Neck supple. No tracheal deviation present. No thyromegaly present.  Cardiovascular: Normal rate, regular rhythm, normal heart sounds and intact distal pulses.  Exam reveals no gallop and no friction rub.   No murmur heard. Pulmonary/Chest: Effort normal and breath sounds normal. No accessory muscle usage. Not tachypneic. No respiratory distress. She has no decreased breath sounds. She has no wheezes. She has no rhonchi. She has no rales. She exhibits no tenderness.  Abdominal: There is no  tenderness (no CVA tenderness).  Musculoskeletal: Normal range of motion. She exhibits no edema and no tenderness.  Lymphadenopathy:    She has no cervical adenopathy.  Neurological: She is alert and oriented to person, place, and time. No cranial nerve deficit. She exhibits normal muscle tone. Coordination normal.  Skin: Skin is warm and dry. No rash noted. She is not diaphoretic. No erythema. No pallor.  Psychiatric: She has a normal mood and affect. Her behavior is normal. Judgment and thought content normal.          Assessment & Plan:   Problem List Items Addressed This Visit     Unprioritized   UTI (lower urinary tract infection) - Primary     Symptoms and UA consistent with UTI. Will send urine for culture. Start cipro. Azo prn. Follow up if symptoms are not improving.    Relevant Medications      ciprofloxacin (CIPRO) tablet   Other Relevant Orders      Urine culture      POCT urinalysis dipstick (Completed)      CULTURE, URINE COMPREHENSIVE       Return if symptoms worsen or fail to improve.

## 2013-09-04 NOTE — Patient Instructions (Addendum)
Start Cipro twice daily for urinary tract infection.  Use Azo which can help with bladder pain.  Call if symptoms are not improving.

## 2013-09-04 NOTE — Assessment & Plan Note (Signed)
Symptoms and UA consistent with UTI. Will send urine for culture. Start cipro. Azo prn. Follow up if symptoms are not improving.

## 2013-09-04 NOTE — Progress Notes (Signed)
Pre-visit discussion using our clinic review tool. No additional management support is needed unless otherwise documented below in the visit note.  

## 2013-09-06 LAB — URINE CULTURE

## 2013-10-16 ENCOUNTER — Encounter: Payer: Self-pay | Admitting: Internal Medicine

## 2013-10-16 ENCOUNTER — Other Ambulatory Visit (HOSPITAL_COMMUNITY)
Admission: RE | Admit: 2013-10-16 | Discharge: 2013-10-16 | Disposition: A | Discharge status: Home / Self Care | Payer: 59 | Source: Ambulatory Visit | Attending: Internal Medicine | Admitting: Internal Medicine

## 2013-10-16 ENCOUNTER — Ambulatory Visit (INDEPENDENT_AMBULATORY_CARE_PROVIDER_SITE_OTHER): Payer: 59 | Admitting: Internal Medicine

## 2013-10-16 VITALS — BP 162/84 | HR 75 | Temp 98.1°F | Ht 63.0 in | Wt 187.0 lb

## 2013-10-16 DIAGNOSIS — Z1151 Encounter for screening for human papillomavirus (HPV): Secondary | ICD-10-CM | POA: Diagnosis present

## 2013-10-16 DIAGNOSIS — M5430 Sciatica, unspecified side: Secondary | ICD-10-CM

## 2013-10-16 DIAGNOSIS — M5431 Sciatica, right side: Secondary | ICD-10-CM

## 2013-10-16 DIAGNOSIS — Z01419 Encounter for gynecological examination (general) (routine) without abnormal findings: Secondary | ICD-10-CM | POA: Insufficient documentation

## 2013-10-16 DIAGNOSIS — Z1239 Encounter for other screening for malignant neoplasm of breast: Secondary | ICD-10-CM

## 2013-10-16 DIAGNOSIS — Z0000 Encounter for general adult medical examination without abnormal findings: Secondary | ICD-10-CM

## 2013-10-16 DIAGNOSIS — I10 Essential (primary) hypertension: Secondary | ICD-10-CM

## 2013-10-16 LAB — CBC WITH DIFFERENTIAL/PLATELET
Basophils Absolute: 0 10*3/uL (ref 0.0–0.1)
Basophils Relative: 0.6 % (ref 0.0–3.0)
EOS PCT: 1.7 % (ref 0.0–5.0)
Eosinophils Absolute: 0.1 10*3/uL (ref 0.0–0.7)
HCT: 43.2 % (ref 36.0–46.0)
Hemoglobin: 14 g/dL (ref 12.0–15.0)
LYMPHS ABS: 3.2 10*3/uL (ref 0.7–4.0)
Lymphocytes Relative: 49.3 % — ABNORMAL HIGH (ref 12.0–46.0)
MCHC: 32.5 g/dL (ref 30.0–36.0)
MCV: 93 fl (ref 78.0–100.0)
MONOS PCT: 6.6 % (ref 3.0–12.0)
Monocytes Absolute: 0.4 10*3/uL (ref 0.1–1.0)
NEUTROS ABS: 2.7 10*3/uL (ref 1.4–7.7)
Neutrophils Relative %: 41.8 % — ABNORMAL LOW (ref 43.0–77.0)
Platelets: 252 10*3/uL (ref 150.0–400.0)
RBC: 4.64 Mil/uL (ref 3.87–5.11)
RDW: 14.6 % (ref 11.5–15.5)
WBC: 6.5 10*3/uL (ref 4.0–10.5)

## 2013-10-16 LAB — COMPREHENSIVE METABOLIC PANEL
ALT: 22 U/L (ref 0–35)
AST: 21 U/L (ref 0–37)
Albumin: 4.3 g/dL (ref 3.5–5.2)
Alkaline Phosphatase: 54 U/L (ref 39–117)
BUN: 13 mg/dL (ref 6–23)
CHLORIDE: 103 meq/L (ref 96–112)
CO2: 27 meq/L (ref 19–32)
CREATININE: 0.8 mg/dL (ref 0.4–1.2)
Calcium: 9.2 mg/dL (ref 8.4–10.5)
GFR: 99.46 mL/min (ref >60.00)
Glucose, Bld: 87 mg/dL (ref 70–99)
Potassium: 3.5 mEq/L (ref 3.5–5.1)
Sodium: 139 mEq/L (ref 135–145)
TOTAL PROTEIN: 7.7 g/dL (ref 6.0–8.3)
Total Bilirubin: 0.7 mg/dL (ref 0.2–1.2)

## 2013-10-16 LAB — MICROALBUMIN / CREATININE URINE RATIO
Creatinine,U: 83.8 mg/dL
MICROALB UR: 0.9 mg/dL (ref 0.0–1.9)
MICROALB/CREAT RATIO: 1.1 mg/g (ref 0.0–30.0)

## 2013-10-16 LAB — HM PAP SMEAR: HM Pap smear: NEGATIVE

## 2013-10-16 LAB — LIPID PANEL
CHOLESTEROL: 208 mg/dL — AB (ref 0–200)
HDL: 100.9 mg/dL (ref >39.00)
LDL Cholesterol: 97 mg/dL (ref 0–99)
NONHDL: 107.1
Total CHOL/HDL Ratio: 2
Triglycerides: 51 mg/dL (ref 0.0–149.0)
VLDL: 10.2 mg/dL (ref 0.0–40.0)

## 2013-10-16 LAB — VITAMIN D 25 HYDROXY (VIT D DEFICIENCY, FRACTURES): VITD: 38.65 ng/mL (ref 30.00–100.00)

## 2013-10-16 MED ORDER — AMLODIPINE BESYLATE 5 MG PO TABS: 5.0000 mg | ORAL_TABLET | Freq: Every day | ORAL | Status: DC

## 2013-10-16 MED ORDER — HYDROCODONE-ACETAMINOPHEN 5-325 MG PO TABS: 1.0000 | ORAL_TABLET | Freq: Three times a day (TID) | ORAL | Status: DC

## 2013-10-16 MED ORDER — PREDNISONE 10 MG PO TABS: ORAL_TABLET | ORAL | Status: DC

## 2013-10-16 MED ORDER — LOSARTAN POTASSIUM 50 MG PO TABS: 50.0000 mg | ORAL_TABLET | Freq: Every day | ORAL | Status: DC

## 2013-10-16 NOTE — Assessment & Plan Note (Signed)
Symptoms c/w right sciatic pain. Will start Prednisone taper and prn Hydrocodone. Discussed referral to sports medicine, but she prefers to hold off for now. Follow up 4 weeks and prn.

## 2013-10-16 NOTE — Patient Instructions (Signed)
Start Prednisone taper to help with pain from sciatica.  Labs today.  Follow up in 4 weeks or sooner as needed.  Health Maintenance Adopting a healthy lifestyle and getting preventive care can go a long way to promote health and wellness. Talk with your health care provider about what schedule of regular examinations is right for you. This is a good chance for you to check in with your provider about disease prevention and staying healthy. In between checkups, there are plenty of things you can do on your own. Experts have done a lot of research about which lifestyle changes and preventive measures are most likely to keep you healthy. Ask your health care provider for more information. WEIGHT AND DIET  Eat a healthy diet  Be sure to include plenty of vegetables, fruits, low-fat dairy products, and lean protein.  Do not eat a lot of foods high in solid fats, added sugars, or salt.  Get regular exercise. This is one of the most important things you can do for your health.  Most adults should exercise for at least 150 minutes each week. The exercise should increase your heart rate and make you sweat (moderate-intensity exercise).  Most adults should also do strengthening exercises at least twice a week. This is in addition to the moderate-intensity exercise.  Maintain a healthy weight  Body mass index (BMI) is a measurement that can be used to identify possible weight problems. It estimates body fat based on height and weight. Your health care provider can help determine your BMI and help you achieve or maintain a healthy weight.  For females 80 years of age and older:   A BMI below 18.5 is considered underweight.  A BMI of 18.5 to 24.9 is normal.  A BMI of 25 to 29.9 is considered overweight.  A BMI of 30 and above is considered obese.  Watch levels of cholesterol and blood lipids  You should start having your blood tested for lipids and cholesterol at 61 years of age, then have  this test every 5 years.  You may need to have your cholesterol levels checked more often if:  Your lipid or cholesterol levels are high.  You are older than 61 years of age.  You are at high risk for heart disease.  CANCER SCREENING   Lung Cancer  Lung cancer screening is recommended for adults 33-53 years old who are at high risk for lung cancer because of a history of smoking.  A yearly low-dose CT scan of the lungs is recommended for people who:  Currently smoke.  Have quit within the past 15 years.  Have at least a 30-pack-year history of smoking. A pack year is smoking an average of one pack of cigarettes a day for 1 year.  Yearly screening should continue until it has been 15 years since you quit.  Yearly screening should stop if you develop a health problem that would prevent you from having lung cancer treatment.  Breast Cancer  Practice breast self-awareness. This means understanding how your breasts normally appear and feel.  It also means doing regular breast self-exams. Let your health care provider know about any changes, no matter how small.  If you are in your 20s or 30s, you should have a clinical breast exam (CBE) by a health care provider every 1-3 years as part of a regular health exam.  If you are 58 or older, have a CBE every year. Also consider having a breast X-ray (mammogram) every year.  If you have a family history of breast cancer, talk to your health care provider about genetic screening.  If you are at high risk for breast cancer, talk to your health care provider about having an MRI and a mammogram every year.  Breast cancer gene (BRCA) assessment is recommended for women who have family members with BRCA-related cancers. BRCA-related cancers include:  Breast.  Ovarian.  Tubal.  Peritoneal cancers.  Results of the assessment will determine the need for genetic counseling and BRCA1 and BRCA2 testing. Cervical Cancer Routine pelvic  examinations to screen for cervical cancer are no longer recommended for nonpregnant women who are considered low risk for cancer of the pelvic organs (ovaries, uterus, and vagina) and who do not have symptoms. A pelvic examination may be necessary if you have symptoms including those associated with pelvic infections. Ask your health care provider if a screening pelvic exam is right for you.   The Pap test is the screening test for cervical cancer for women who are considered at risk.  If you had a hysterectomy for a problem that was not cancer or a condition that could lead to cancer, then you no longer need Pap tests.  If you are older than 65 years, and you have had normal Pap tests for the past 10 years, you no longer need to have Pap tests.  If you have had past treatment for cervical cancer or a condition that could lead to cancer, you need Pap tests and screening for cancer for at least 20 years after your treatment.  If you no longer get a Pap test, assess your risk factors if they change (such as having a new sexual partner). This can affect whether you should start being screened again.  Some women have medical problems that increase their chance of getting cervical cancer. If this is the case for you, your health care provider may recommend more frequent screening and Pap tests.  The human papillomavirus (HPV) test is another test that may be used for cervical cancer screening. The HPV test looks for the virus that can cause cell changes in the cervix. The cells collected during the Pap test can be tested for HPV.  The HPV test can be used to screen women 64 years of age and older. Getting tested for HPV can extend the interval between normal Pap tests from three to five years.  An HPV test also should be used to screen women of any age who have unclear Pap test results.  After 61 years of age, women should have HPV testing as often as Pap tests.  Colorectal Cancer  This type of  cancer can be detected and often prevented.  Routine colorectal cancer screening usually begins at 61 years of age and continues through 61 years of age.  Your health care provider may recommend screening at an earlier age if you have risk factors for colon cancer.  Your health care provider may also recommend using home test kits to check for hidden blood in the stool.  A small camera at the end of a tube can be used to examine your colon directly (sigmoidoscopy or colonoscopy). This is done to check for the earliest forms of colorectal cancer.  Routine screening usually begins at age 48.  Direct examination of the colon should be repeated every 5-10 years through 61 years of age. However, you may need to be screened more often if early forms of precancerous polyps or small growths are found. Skin Cancer  Check your skin from head to toe regularly.  Tell your health care provider about any new moles or changes in moles, especially if there is a change in a mole's shape or color.  Also tell your health care provider if you have a mole that is larger than the size of a pencil eraser.  Always use sunscreen. Apply sunscreen liberally and repeatedly throughout the day.  Protect yourself by wearing long sleeves, pants, a wide-brimmed hat, and sunglasses whenever you are outside. HEART DISEASE, DIABETES, AND HIGH BLOOD PRESSURE   Have your blood pressure checked at least every 1-2 years. High blood pressure causes heart disease and increases the risk of stroke.  If you are between 50 years and 48 years old, ask your health care provider if you should take aspirin to prevent strokes.  Have regular diabetes screenings. This involves taking a blood sample to check your fasting blood sugar level.  If you are at a normal weight and have a low risk for diabetes, have this test once every three years after 61 years of age.  If you are overweight and have a high risk for diabetes, consider being  tested at a younger age or more often. PREVENTING INFECTION  Hepatitis B  If you have a higher risk for hepatitis B, you should be screened for this virus. You are considered at high risk for hepatitis B if:  You were born in a country where hepatitis B is common. Ask your health care provider which countries are considered high risk.  Your parents were born in a high-risk country, and you have not been immunized against hepatitis B (hepatitis B vaccine).  You have HIV or AIDS.  You use needles to inject street drugs.  You live with someone who has hepatitis B.  You have had sex with someone who has hepatitis B.  You get hemodialysis treatment.  You take certain medicines for conditions, including cancer, organ transplantation, and autoimmune conditions. Hepatitis C  Blood testing is recommended for:  Everyone born from [birth year] through 1963.  Anyone with known risk factors for hepatitis C. Sexually transmitted infections (STIs)  You should be screened for sexually transmitted infections (STIs) including gonorrhea and chlamydia if:  You are sexually active and are younger than 61 years of age.  You are older than 61 years of age and your health care provider tells you that you are at risk for this type of infection.  Your sexual activity has changed since you were last screened and you are at an increased risk for chlamydia or gonorrhea. Ask your health care provider if you are at risk.  If you do not have HIV, but are at risk, it may be recommended that you take a prescription medicine daily to prevent HIV infection. This is called pre-exposure prophylaxis (PrEP). You are considered at risk if:  You are sexually active and do not regularly use condoms or know the HIV status of your partner(s).  You take drugs by injection.  You are sexually active with a partner who has HIV. Talk with your health care provider about whether you are at high risk of being infected with HIV. If  you choose to begin PrEP, you should first be tested for HIV. You should then be tested every 3 months for as long as you are taking PrEP.  PREGNANCY   If you are premenopausal and you may become pregnant, ask your health care provider about preconception counseling.  If you may become pregnant,  take 400 to 800 micrograms (mcg) of folic acid every day.  If you want to prevent pregnancy, talk to your health care provider about birth control (contraception). OSTEOPOROSIS AND MENOPAUSE   Osteoporosis is a disease in which the bones lose minerals and strength with aging. This can result in serious bone fractures. Your risk for osteoporosis can be identified using a bone density scan.  If you are 10 years of age or older, or if you are at risk for osteoporosis and fractures, ask your health care provider if you should be screened.  Ask your health care provider whether you should take a calcium or vitamin D supplement to lower your risk for osteoporosis.  Menopause may have certain physical symptoms and risks.  Hormone replacement therapy may reduce some of these symptoms and risks. Talk to your health care provider about whether hormone replacement therapy is right for you.  HOME CARE INSTRUCTIONS   Schedule regular health, dental, and eye exams.  Stay current with your immunizations.   Do not use any tobacco products including cigarettes, chewing tobacco, or electronic cigarettes.  If you are pregnant, do not drink alcohol.  If you are breastfeeding, limit how much and how often you drink alcohol.  Limit alcohol intake to no more than 1 drink per day for nonpregnant women. One drink equals 12 ounces of beer, 5 ounces of wine, or 1 ounces of hard liquor.  Do not use street drugs.  Do not share needles.  Ask your health care provider for help if you need support or information about quitting drugs.  Tell your health care provider if you often feel depressed.  Tell your health  care provider if you have ever been abused or do not feel safe at home. Document Released: 09/07/2010 Document Revised: 07/09/2013 Document Reviewed: 01/24/2013 [facility] Patient Information 2015 [place], [place]. This information is not intended to replace advice given to you by your health care provider. Make sure you discuss any questions you have with your health care provider.

## 2013-10-16 NOTE — Addendum Note (Signed)
Addended by: Karlene Einstein D on: 10/16/2013 02:31 PM   Modules accepted: Orders

## 2013-10-16 NOTE — Assessment & Plan Note (Signed)
General medical exam including breast and pelvic exam normal today except as noted. Pap is pending. Mammogram is up-to-date. Colonoscopy up-to-date. Encouraged efforts at healthy diet and regular physical activity with goal of weight loss.  Will check labs including CBC, CMP, lipids, TSH, Vit D. Follow up 6 months and prn.

## 2013-10-16 NOTE — Assessment & Plan Note (Signed)
BP Readings from Last 3 Encounters:  10/16/13 162/84  09/04/13 158/96  04/27/13 140/86   BP elevated today, however pt having significant leg pain. Will have her monitor at home, and if BP >140/90 increase Amlodipine to 10mg  daily. Follow up 4 weeks for recheck.

## 2013-10-16 NOTE — Progress Notes (Signed)
Subjective:    Patient ID: Diamond Hill, female    DOB: 05-17-1950, 63 y.o.   MRN: [id number]  HPI 63YO female presents for annual exam.  Having right sciatic pain over last 2 weeks. Pain is sharp and radiates from posterior right hip around thigh to front of leg. Tried taking some left over percocet with no improvement. No weakness or numbness noted.  Otherwise, feeling well. Trying to follow healthy diet. Compliant with medications. No recent headache, palpitations, chest pain.  Review of Systems  Constitutional: Negative for fever, chills, appetite change, fatigue and unexpected weight change.  Eyes: Negative for visual disturbance.  Respiratory: Negative for shortness of breath.   Cardiovascular: Negative for chest pain and leg swelling.  Gastrointestinal: Negative for nausea, vomiting, abdominal pain, diarrhea, constipation and blood in stool.  Genitourinary: Positive for dyspareunia.  Musculoskeletal: Positive for arthralgias and myalgias.  Skin: Negative for color change and rash.  Neurological: Negative for weakness and numbness.  Hematological: Negative for adenopathy. Does not bruise/bleed easily.  Psychiatric/Behavioral: Negative for dysphoric mood. The patient is not nervous/anxious.        Objective:    BP 162/84  Pulse 75  Temp(Src) 98.1 F (36.7 C) (Oral)  Ht 5\' 3"  (1.6 m)  Wt 187 lb (84.823 kg)  BMI 33.13 kg/m2  SpO2 97% Physical Exam  Constitutional: She is oriented to person, place, and time. She appears well-developed and well-nourished. No distress.  HENT:  Head: Normocephalic and atraumatic.  Right Ear: External ear normal.  Left Ear: External ear normal.  Nose: Nose normal.  Mouth/Throat: Oropharynx is clear and moist. No oropharyngeal exudate.  Eyes: Conjunctivae are normal. Pupils are equal, round, and reactive to light. Right eye exhibits no discharge. Left eye exhibits no discharge. No scleral icterus.  Neck: Normal range of motion. Neck  supple. No tracheal deviation present. No thyromegaly present.  Cardiovascular: Normal rate, regular rhythm, normal heart sounds and intact distal pulses.  Exam reveals no gallop and no friction rub.   No murmur heard. Pulmonary/Chest: Effort normal and breath sounds normal. No respiratory distress. She has no wheezes. She has no rales. She exhibits no tenderness.  Abdominal: Soft. Bowel sounds are normal. She exhibits no distension and no mass. There is no tenderness. There is no rebound and no guarding.  Genitourinary: Rectum normal, vagina normal and uterus normal. No breast swelling, tenderness, discharge or bleeding. Pelvic exam was performed with patient supine. There is no rash, tenderness or lesion on the right labia. There is no rash, tenderness or lesion on the left labia. Uterus is not enlarged and not tender. Cervix exhibits no motion tenderness, no discharge and no friability. Right adnexum displays no mass, no tenderness and no fullness. Left adnexum displays no mass, no tenderness and no fullness. No erythema or tenderness around the vagina. No vaginal discharge found.  Musculoskeletal: Normal range of motion. She exhibits no edema and no tenderness.       Back:  Lymphadenopathy:    She has no cervical adenopathy.  Neurological: She is alert and oriented to person, place, and time. No cranial nerve deficit. She exhibits normal muscle tone. Coordination normal.  Skin: Skin is warm and dry. No rash noted. She is not diaphoretic. No erythema. No pallor.  Psychiatric: She has a normal mood and affect. Her behavior is normal. Judgment and thought content normal.          Assessment & Plan:   Problem List Items Addressed This Visit  Unprioritized   Hypertension      BP Readings from Last 3 Encounters:  10/16/13 162/84  09/04/13 158/96  04/27/13 140/86   BP elevated today, however pt having significant leg pain. Will have her monitor at home, and if BP >140/90 increase  Amlodipine to 10mg  daily. Follow up 4 weeks for recheck.    Relevant Medications      amLODIpine (NORVASC) tablet      losartan (COZAAR) tablet   Right sciatic nerve pain     Symptoms c/w right sciatic pain. Will start Prednisone taper and prn Hydrocodone. Discussed referral to sports medicine, but she prefers to hold off for now. Follow up 4 weeks and prn.    Relevant Medications      predniSONE (DELTASONE) tablet      HYDROcodone-acetaminophen (NORCO/VICODIN) 5-325 MG per tablet   Routine general medical examination at a health care facility - Primary     General medical exam including breast and pelvic exam normal today except as noted. Pap is pending. Mammogram is up-to-date. Colonoscopy up-to-date. Encouraged efforts at healthy diet and regular physical activity with goal of weight loss.  Will check labs including CBC, CMP, lipids, TSH, Vit D. Follow up 6 months and prn.      Relevant Orders      CBC with Differential      Comprehensive metabolic panel      Lipid panel      Microalbumin / creatinine urine ratio      Vit D  25 hydroxy (rtn osteoporosis monitoring)    Other Visit Diagnoses   Screening for breast cancer        Relevant Orders       MM Digital Screening        Return in about 4 weeks (around 11/13/2013) for Recheck.

## 2013-10-16 NOTE — Progress Notes (Signed)
Pre visit review using our clinic review tool, if applicable. No additional management support is needed unless otherwise documented below in the visit note. 

## 2013-10-17 LAB — CYTOLOGY - PAP

## 2013-11-10 LAB — HM MAMMOGRAPHY: HM Mammogram: NORMAL

## 2013-11-14 ENCOUNTER — Ambulatory Visit: Payer: Self-pay | Admitting: Internal Medicine

## 2013-11-16 ENCOUNTER — Ambulatory Visit (INDEPENDENT_AMBULATORY_CARE_PROVIDER_SITE_OTHER): Payer: 59 | Admitting: Internal Medicine

## 2013-11-16 ENCOUNTER — Encounter: Payer: Self-pay | Admitting: Internal Medicine

## 2013-11-16 VITALS — BP 144/82 | HR 70 | Temp 98.3°F | Ht 63.0 in | Wt 189.8 lb

## 2013-11-16 DIAGNOSIS — I10 Essential (primary) hypertension: Secondary | ICD-10-CM

## 2013-11-16 DIAGNOSIS — M5430 Sciatica, unspecified side: Secondary | ICD-10-CM

## 2013-11-16 DIAGNOSIS — M5431 Sciatica, right side: Secondary | ICD-10-CM

## 2013-11-16 NOTE — Progress Notes (Signed)
Pre visit review using our clinic review tool, if applicable. No additional management support is needed unless otherwise documented below in the visit note. 

## 2013-11-16 NOTE — Patient Instructions (Signed)
Follow up in 6 months or sooner as needed.

## 2013-11-16 NOTE — Progress Notes (Signed)
   Subjective:    Patient ID: Diamond Hill, female    DOB: [birth year], 63 y.o.   MRN: [id number]  HPI 63YO female presents for follow up.  Right Sciatica - Pain improved, however still having occasional "nagging" pain in lateral right thigh. Not taking anything for pain.  HTN - Checking BP at home, typically 127/76. No chest pain, headache.  Review of Systems  Constitutional: Negative for fever, chills, appetite change, fatigue and unexpected weight change.  Eyes: Negative for visual disturbance.  Respiratory: Negative for shortness of breath.   Cardiovascular: Negative for chest pain and leg swelling.  Gastrointestinal: Negative for nausea, vomiting, abdominal pain, diarrhea and constipation.  Musculoskeletal: Positive for myalgias. Negative for arthralgias and back pain.  Skin: Negative for color change and rash.  Neurological: Negative for headaches.  Hematological: Negative for adenopathy. Does not bruise/bleed easily.  Psychiatric/Behavioral: Negative for dysphoric mood. The patient is not nervous/anxious.        Objective:    BP 144/82  Pulse 70  Temp(Src) 98.3 F (36.8 C) (Oral)  Ht 5\' 3"  (1.6 m)  Wt 189 lb 12 oz (86.07 kg)  BMI 33.62 kg/m2  SpO2 95% Physical Exam  Constitutional: She is oriented to person, place, and time. She appears well-developed and well-nourished. No distress.  HENT:  Head: Normocephalic and atraumatic.  Right Ear: External ear normal.  Left Ear: External ear normal.  Nose: Nose normal.  Mouth/Throat: Oropharynx is clear and moist. No oropharyngeal exudate.  Eyes: Conjunctivae are normal. Pupils are equal, round, and reactive to light. Right eye exhibits no discharge. Left eye exhibits no discharge. No scleral icterus.  Neck: Normal range of motion. Neck supple. No tracheal deviation present. No thyromegaly present.  Cardiovascular: Normal rate, regular rhythm, normal heart sounds and intact distal pulses.  Exam reveals no gallop and no  friction rub.   No murmur heard. Pulmonary/Chest: Effort normal and breath sounds normal. No accessory muscle usage. Not tachypneic. No respiratory distress. She has no decreased breath sounds. She has no wheezes. She has no rhonchi. She has no rales. She exhibits no tenderness.  Musculoskeletal: Normal range of motion. She exhibits no edema and no tenderness.  Lymphadenopathy:    She has no cervical adenopathy.  Neurological: She is alert and oriented to person, place, and time. No cranial nerve deficit. She exhibits normal muscle tone. Coordination normal.  Skin: Skin is warm and dry. No rash noted. She is not diaphoretic. No erythema. No pallor.  Psychiatric: She has a normal mood and affect. Her behavior is normal. Judgment and thought content normal.          Assessment & Plan:   Problem List Items Addressed This Visit     Unprioritized   Hypertension      BP Readings from Last 3 Encounters:  11/16/13 144/82  10/16/13 162/84  09/04/13 158/96   BP improved compared to previous, better controlled at home. Will continue to monitor for now. Recent renal function normal.    Right sciatic nerve pain - Primary     Symptoms improved. Will continue stretching exercises. If symptoms persistent or worsening, will plan to set up referral to sports medicine.        Return in about 6 months (around 05/17/2014) for Recheck.

## 2013-11-16 NOTE — Assessment & Plan Note (Signed)
Symptoms improved. Will continue stretching exercises. If symptoms persistent or worsening, will plan to set up referral to sports medicine.

## 2013-11-16 NOTE — Assessment & Plan Note (Signed)
BP Readings from Last 3 Encounters:  11/16/13 144/82  10/16/13 162/84  09/04/13 158/96   BP improved compared to previous, better controlled at home. Will continue to monitor for now. Recent renal function normal.

## 2013-11-19 ENCOUNTER — Encounter: Payer: Self-pay | Admitting: Internal Medicine

## 2013-12-20 ENCOUNTER — Telehealth: Payer: Self-pay | Admitting: *Deleted

## 2013-12-20 NOTE — Telephone Encounter (Signed)
Pt called requesting a refill on pain medication 

## 2013-12-20 NOTE — Telephone Encounter (Signed)
Notified pt, transferred her to have appt scheduled

## 2013-12-20 NOTE — Telephone Encounter (Signed)
Last time we saw her, she was not taking any pain medication? She will need to be seen in a visit.

## 2014-01-03 ENCOUNTER — Ambulatory Visit: Payer: 59 | Admitting: Internal Medicine

## 2014-05-17 ENCOUNTER — Encounter: Payer: Self-pay | Admitting: Internal Medicine

## 2014-05-17 ENCOUNTER — Ambulatory Visit (INDEPENDENT_AMBULATORY_CARE_PROVIDER_SITE_OTHER): Payer: 59 | Admitting: Internal Medicine

## 2014-05-17 VITALS — BP 148/86 | HR 72 | Resp 14 | Ht 63.0 in | Wt 188.8 lb

## 2014-05-17 DIAGNOSIS — I10 Essential (primary) hypertension: Secondary | ICD-10-CM

## 2014-05-17 DIAGNOSIS — M5431 Sciatica, right side: Secondary | ICD-10-CM

## 2014-05-17 LAB — COMPREHENSIVE METABOLIC PANEL
ALT: 21 U/L (ref 0–35)
AST: 19 U/L (ref 0–37)
Albumin: 4.3 g/dL (ref 3.5–5.2)
Alkaline Phosphatase: 52 U/L (ref 39–117)
BUN: 15 mg/dL (ref 6–23)
CALCIUM: 9.4 mg/dL (ref 8.4–10.5)
CHLORIDE: 105 meq/L (ref 96–112)
CO2: 30 meq/L (ref 19–32)
CREATININE: 0.79 mg/dL (ref 0.40–1.20)
GFR: 94.94 mL/min (ref >60.00)
Glucose, Bld: 97 mg/dL (ref 70–99)
Potassium: 3.7 mEq/L (ref 3.5–5.1)
Sodium: 140 mEq/L (ref 135–145)
Total Bilirubin: 0.5 mg/dL (ref 0.2–1.2)
Total Protein: 7.3 g/dL (ref 6.0–8.3)

## 2014-05-17 MED ORDER — HYDROCODONE-ACETAMINOPHEN 5-325 MG PO TABS: 1.0000 | ORAL_TABLET | Freq: Three times a day (TID) | ORAL | Status: DC | PRN

## 2014-05-17 NOTE — Progress Notes (Signed)
Pre visit review using our clinic review tool, if applicable. No additional management support is needed unless otherwise documented below in the visit note. 

## 2014-05-17 NOTE — Assessment & Plan Note (Addendum)
Right sciatic pain improved, however has occasional flares of pain. Will continue prn Hydrocodone if recurrent severe pain. Reviewed some stretching exercises to help control symptoms.

## 2014-05-17 NOTE — Assessment & Plan Note (Signed)
BP Readings from Last 3 Encounters:  05/17/14 148/86  11/16/13 144/82  10/16/13 162/84   BP slightly elevated. Discussed increase in Losartan dosing, however pt prefers to hold off for now. Will recheck in 3 months with physical exam and consider increase in dose then.

## 2014-05-17 NOTE — Progress Notes (Signed)
   Subjective:    Patient ID: Diamond Hill, female    DOB: 08/29/1950, 64 y.o.   MRN: [id number]  HPI 64YO female presents for follow up.  Last seen 11/2013 for right sciatic pain. Sciatic pain improved after 2 weeks. Also seen by chiropractor which helped.  HTN - Compliant with medication. BP has been running 130/80s to max 140/80-90.   Past medical, surgical, family and social history per today's encounter.  Review of Systems  Constitutional: Negative for fever, chills, appetite change, fatigue and unexpected weight change.  HENT: Positive for congestion, rhinorrhea and sneezing.   Eyes: Negative for visual disturbance.  Respiratory: Negative for shortness of breath.   Cardiovascular: Negative for chest pain and leg swelling.  Gastrointestinal: Negative for nausea, abdominal pain, diarrhea and constipation.  Musculoskeletal: Negative for myalgias, back pain and arthralgias.  Skin: Negative for color change and rash.  Hematological: Negative for adenopathy. Does not bruise/bleed easily.  Psychiatric/Behavioral: Negative for dysphoric mood. The patient is not nervous/anxious.        Objective:    BP 148/86 mmHg  Pulse 72  Resp 14  Ht 5\' 3"  (1.6 m)  Wt 188 lb 12.8 oz (85.639 kg)  BMI 33.45 kg/m2  SpO2 97% Physical Exam  Constitutional: She is oriented to person, place, and time. She appears well-developed and well-nourished. No distress.  HENT:  Head: Normocephalic and atraumatic.  Right Ear: External ear normal.  Left Ear: External ear normal.  Nose: Nose normal.  Mouth/Throat: Oropharynx is clear and moist. No oropharyngeal exudate.  Eyes: Conjunctivae are normal. Pupils are equal, round, and reactive to light. Right eye exhibits no discharge. Left eye exhibits no discharge. No scleral icterus.  Neck: Normal range of motion. Neck supple. No tracheal deviation present. No thyromegaly present.  Cardiovascular: Normal rate, regular rhythm, normal heart sounds and  intact distal pulses.  Exam reveals no gallop and no friction rub.   No murmur heard. Pulmonary/Chest: Effort normal and breath sounds normal. No respiratory distress. She has no wheezes. She has no rales. She exhibits no tenderness.  Musculoskeletal: Normal range of motion. She exhibits no edema or tenderness.  Lymphadenopathy:    She has no cervical adenopathy.  Neurological: She is alert and oriented to person, place, and time. No cranial nerve deficit. She exhibits normal muscle tone. Coordination normal.  Skin: Skin is warm and dry. No rash noted. She is not diaphoretic. No erythema. No pallor.  Psychiatric: She has a normal mood and affect. Her behavior is normal. Judgment and thought content normal.          Assessment & Plan:   Problem List Items Addressed This Visit      Unprioritized   Hypertension - Primary    BP Readings from Last 3 Encounters:  05/17/14 148/86  11/16/13 144/82  10/16/13 162/84   BP slightly elevated. Discussed increase in Losartan dosing, however pt prefers to hold off for now. Will recheck in 3 months with physical exam and consider increase in dose then.      Relevant Orders   Comprehensive metabolic panel   Right sciatic nerve pain    Right sciatic pain improved, however has occasional flares of pain. Will continue prn Hydrocodone if recurrent severe pain. Reviewed some stretching exercises to help control symptoms.          Return in about 5 months (around 10/17/2014) for Physical.

## 2014-05-17 NOTE — Patient Instructions (Signed)
Email periodically with blood pressure readings.

## 2014-06-29 NOTE — Op Note (Signed)
PATIENT NAME:  Diamond Hill, Diamond Hill MR#:  [id number] DATE OF BIRTH:  1951/01/09  DATE OF PROCEDURE:  03/26/2013  PREOPERATIVE DIAGNOSIS: Right parotid pleomorphic adenoma.  POSTOPERATIVE DIAGNOSIS: Right parotid pleomorphic adenoma.  ATTENDING SURGEON: Beverly Gust, MD  ASSISTANT SURGEON: Dr. Kathyrn Sheriff.  OPERATION PERFORMED: Right superficial parotidectomy and facial nerve monitoring for 1 hour.   OPERATIVE FINDINGS: A 3 x 3 cm mass in the right posterior tail of the parotid. There was also small approximately 0.5 cm, what appeared to be, lymph node adjacent to the marginal branch of the facial nerve.   DESCRIPTION OF PROCEDURE: Diamond Hill was identified in the holding area, taken to the operating room and placed in the supine position. After general endotracheal anesthesia, the table was turned 90 degrees. A facial nerve monitor was placed in the right lower face. The right neck was then prepped and draped sterilely. An obvious mass was palpated in the tail of parotid, approximately 2 fingerbreadths below the angle of the mandible. An incision line was marked overlying this mass. A total of local anesthetic 0.25% lidocaine with 1:100,000 epinephrine was used to inject, a total of 3 mL was used. With the right face and neck prepped and draped sterilely, a 15 blade was used to incise down to and through the platysma muscle. The mass was easily identifiable beneath this mass. The mass was separated from the sternocleidomastoid muscle posteriorly and was retracted superiorly. There were several vessels feeding into the area, which were divided using the Harmonic scalpel. The dissection proceeded anteriorly. The branch of the facial nerve to the platysma muscle was identified and stimulated and left intact throughout the procedure. The dissection then proceeded anteriorly and medially and as this proceeded,  the marginal branch of the facial nerve was identified. This was stimulated and stimulated the lower  aspect of face remained intact. This branch was then traced medially as it went beneath and medial to the mass. As the mass was peeled off of the nerve using the Harmonic scalpel and the microbipolar, care was taken not to interrupt the capsule. As this was peeled medially over the marginal branch of the facial nerve, the fascia was released superiorly and anteriorly allowing the  mass to swing superiorly. There appeared to be a small lymph node adjacent to the marginal branch of the facial nerve. This was removed and sent for permanent section. As the mass was peeled superiorly, the fascia was divided anteriorly along the superior border of the mass. Care was taken to hold the mass firmly between fingers to prevent any entrance into the capsule of this mass. The dissection proceeded again medially and superiorly over the marginal branch of the facial nerve, which remained intact. The mass was then excised entirely once the anterior fascia was released. With the mass removed and inspected, it was well within, what appeared to be, normal parotid tissue. There was no evidence of opening of the capsule. The wound was then copiously irrigated with saline. Any small bleeding points were cauterized using the microbipolar. Thee nerves were stimulated at the end of the case and all remained intact. A #10 TLS drain was brought out of the wound inferiorly. The platysmal layer was closed using interrupted 4-0 Vicryl. The subcutaneous tissues were closed using 4-0 Vicryl and the skin was closed using Dermabond. The patient was returned to anesthesia where she was awakened in the operating room and taken to the recovery room in stable condition.   CULTURES: None.   SPECIMENS: Right superficial  lobe of the parotid and right intraparotid lymph node.   ESTIMATED BLOOD LOSS: Less than 20 mL.   ____________________________ Roena Malady, MD [id number] D: 03/26/2013 08:48:20 ET T: 03/26/2013 13:24:07  ET JOB#: [id number]  cc: Roena Malady, MD, <Dictator> Roena Malady MD ELECTRONICALLY SIGNED 03/27/2013 9:01

## 2014-09-11 ENCOUNTER — Ambulatory Visit: Payer: 59 | Admitting: Nurse Practitioner

## 2014-10-16 ENCOUNTER — Other Ambulatory Visit: Payer: Self-pay | Admitting: *Deleted

## 2014-10-16 DIAGNOSIS — I10 Essential (primary) hypertension: Secondary | ICD-10-CM

## 2014-10-16 MED ORDER — AMLODIPINE BESYLATE 5 MG PO TABS: 5.0000 mg | ORAL_TABLET | Freq: Every day | ORAL | Status: DC

## 2014-10-16 MED ORDER — LOSARTAN POTASSIUM 50 MG PO TABS: 50.0000 mg | ORAL_TABLET | Freq: Every day | ORAL | Status: DC

## 2014-10-30 ENCOUNTER — Encounter: Payer: Self-pay | Admitting: Internal Medicine

## 2014-10-30 ENCOUNTER — Ambulatory Visit (INDEPENDENT_AMBULATORY_CARE_PROVIDER_SITE_OTHER): Payer: 59 | Admitting: Internal Medicine

## 2014-10-30 VITALS — BP 150/74 | HR 74 | Temp 97.8°F | Ht 63.25 in | Wt 192.0 lb

## 2014-10-30 DIAGNOSIS — Z85048 Personal history of other malignant neoplasm of rectum, rectosigmoid junction, and anus: Secondary | ICD-10-CM | POA: Diagnosis not present

## 2014-10-30 DIAGNOSIS — N941 Dyspareunia: Secondary | ICD-10-CM

## 2014-10-30 DIAGNOSIS — IMO0002 Reserved for concepts with insufficient information to code with codable children: Secondary | ICD-10-CM | POA: Insufficient documentation

## 2014-10-30 DIAGNOSIS — Z0000 Encounter for general adult medical examination without abnormal findings: Secondary | ICD-10-CM | POA: Diagnosis not present

## 2014-10-30 LAB — MICROALBUMIN / CREATININE URINE RATIO
CREATININE, U: 59.3 mg/dL
MICROALB/CREAT RATIO: 1.2 mg/g (ref 0.0–30.0)
Microalb, Ur: <0.7 mg/dL (ref 0.0–1.9)

## 2014-10-30 LAB — CBC WITH DIFFERENTIAL/PLATELET
BASOS PCT: 0.6 % (ref 0.0–3.0)
Basophils Absolute: 0 10*3/uL (ref 0.0–0.1)
EOS ABS: 0.2 10*3/uL (ref 0.0–0.7)
EOS PCT: 3.4 % (ref 0.0–5.0)
HEMATOCRIT: 43.2 % (ref 36.0–46.0)
Hemoglobin: 14.4 g/dL (ref 12.0–15.0)
LYMPHS PCT: 48.3 % — AB (ref 12.0–46.0)
Lymphs Abs: 3 10*3/uL (ref 0.7–4.0)
MCHC: 33.4 g/dL (ref 30.0–36.0)
MCV: 92.9 fl (ref 78.0–100.0)
MONO ABS: 0.3 10*3/uL (ref 0.1–1.0)
Monocytes Relative: 5.3 % (ref 3.0–12.0)
NEUTROS ABS: 2.7 10*3/uL (ref 1.4–7.7)
Neutrophils Relative %: 42.4 % — ABNORMAL LOW (ref 43.0–77.0)
PLATELETS: 262 10*3/uL (ref 150.0–400.0)
RBC: 4.65 Mil/uL (ref 3.87–5.11)
RDW: 14.1 % (ref 11.5–15.5)
WBC: 6.3 10*3/uL (ref 4.0–10.5)

## 2014-10-30 LAB — LIPID PANEL
Cholesterol: 196 mg/dL (ref 0–200)
HDL: 97 mg/dL (ref >39.00)
LDL Cholesterol: 90 mg/dL (ref 0–99)
NONHDL: 99.16
TRIGLYCERIDES: 48 mg/dL (ref 0.0–149.0)
Total CHOL/HDL Ratio: 2
VLDL: 9.6 mg/dL (ref 0.0–40.0)

## 2014-10-30 LAB — COMPREHENSIVE METABOLIC PANEL
ALT: 18 U/L (ref 0–35)
AST: 19 U/L (ref 0–37)
Albumin: 4.2 g/dL (ref 3.5–5.2)
Alkaline Phosphatase: 46 U/L (ref 39–117)
BUN: 14 mg/dL (ref 6–23)
CALCIUM: 9.5 mg/dL (ref 8.4–10.5)
CHLORIDE: 103 meq/L (ref 96–112)
CO2: 27 meq/L (ref 19–32)
CREATININE: 0.77 mg/dL (ref 0.40–1.20)
GFR: 97.64 mL/min (ref >60.00)
Glucose, Bld: 92 mg/dL (ref 70–99)
POTASSIUM: 3.7 meq/L (ref 3.5–5.1)
Sodium: 140 mEq/L (ref 135–145)
Total Bilirubin: 0.4 mg/dL (ref 0.2–1.2)
Total Protein: 7.5 g/dL (ref 6.0–8.3)

## 2014-10-30 LAB — VITAMIN D 25 HYDROXY (VIT D DEFICIENCY, FRACTURES): VITD: 20.91 ng/mL — AB (ref 30.00–100.00)

## 2014-10-30 LAB — TSH: TSH: 1.93 u[IU]/mL (ref 0.35–4.50)

## 2014-10-30 NOTE — Assessment & Plan Note (Signed)
Discussed both potential atrophy and scarring from previous XRT and natural atrophy with menopause. Encouraged use of silicone based lubricants and vibrator in between sexual partners to keep area dilated. Follow up prn.

## 2014-10-30 NOTE — Assessment & Plan Note (Signed)
General medical exam normal today including breast exam. PAP and pelvic deferred as PAP normal, HPV neg in 2014, 2015. Mammogram ordered. Colonoscopy UTD. Labs as ordered. Encouraged healthy diet and exercise.

## 2014-10-30 NOTE — Patient Instructions (Signed)

## 2014-10-30 NOTE — Assessment & Plan Note (Signed)
Reviewed notes from oncology. Has follow up in 1 year.

## 2014-10-30 NOTE — Progress Notes (Signed)
Pre visit review using our clinic review tool, if applicable. No additional management support is needed unless otherwise documented below in the visit note. 

## 2014-10-30 NOTE — Progress Notes (Signed)
Subjective:    Patient ID: Diamond Hill, female    DOB: 17-Oct-1950, 64 y.o.   MRN: [id number]  HPI  64YO female presents for annual exam.  Feeling well. Recently seen by oncology. Cleared to follow up in 1 year.  Notes occasional pain with intercourse. Told this would be worse after radiation therapy. Using lubricant with some improvement.   BP Readings from Last 3 Encounters:  10/30/14 150/74  05/17/14 148/86  11/16/13 144/82      Past Medical History  Diagnosis Date  . Blood in stool   . Diverticulitis   . Hypertension   . Colon polyp   . Cancer 2005    rectal, Dr. Claybon Jabs, Dr. Mallie Snooks, Dr. Leamon Arnt, s/p XRT  . Heart murmur    Family History  Problem Relation Age of Onset  . Cancer Mother     colon  . Hyperlipidemia Mother   . Hypertension Mother   . Hyperlipidemia Father   . Hypertension Father   . Diabetes Father   . Heart disease Father   . Hypertension Other    Past Surgical History  Procedure Laterality Date  . Rectal cancer  2005    removal of lesion  . Abdominal hysterectomy  1993    ovaries intact   Social History   Social History  . Marital Status: Divorced    Spouse Name: N/A  . Number of Children: N/A  . Years of Education: N/A   Social History Main Topics  . Smoking status: Never Smoker   . Smokeless tobacco: None  . Alcohol Use: Yes  . Drug Use: No  . Sexual Activity: Not Asked   Other Topics Concern  . None   Social History Narrative   Lives in [place]. Separate from husband. 2 children.      Work Occupational psychologist at [employer].   Diet - Healthy   Exercise - none regular    Review of Systems  Constitutional: Negative for fever, chills, appetite change, fatigue and unexpected weight change.  Eyes: Negative for visual disturbance.  Respiratory: Negative for shortness of breath.   Cardiovascular: Negative for chest pain and leg swelling.  Gastrointestinal: Negative for nausea, vomiting, abdominal pain, diarrhea and constipation.    Genitourinary: Positive for dyspareunia. Negative for dysuria, urgency, frequency, hematuria, vaginal bleeding, vaginal discharge, genital sores, vaginal pain and pelvic pain.  Musculoskeletal: Negative for myalgias and arthralgias.  Skin: Negative for color change and rash.  Hematological: Negative for adenopathy. Does not bruise/bleed easily.  Psychiatric/Behavioral: Negative for sleep disturbance and dysphoric mood. The patient is not nervous/anxious.        Objective:    BP 150/74 mmHg  Pulse 74  Temp(Src) 97.8 F (36.6 C) (Oral)  Ht 5' 3.25" (1.607 m)  Wt 192 lb (87.091 kg)  BMI 33.72 kg/m2  SpO2 99% Physical Exam  Constitutional: She is oriented to person, place, and time. She appears well-developed and well-nourished. No distress.  HENT:  Head: Normocephalic and atraumatic.  Right Ear: External ear normal.  Left Ear: External ear normal.  Nose: Nose normal.  Mouth/Throat: Oropharynx is clear and moist. No oropharyngeal exudate.  Eyes: Conjunctivae are normal. Pupils are equal, round, and reactive to light. Right eye exhibits no discharge. Left eye exhibits no discharge. No scleral icterus.  Neck: Normal range of motion. Neck supple. No tracheal deviation present. No thyromegaly present.  Cardiovascular: Normal rate, regular rhythm, normal heart sounds and intact distal pulses.  Exam reveals no gallop and no  friction rub.   No murmur heard. Pulmonary/Chest: Effort normal and breath sounds normal. No accessory muscle usage. No tachypnea. No respiratory distress. She has no decreased breath sounds. She has no wheezes. She has no rales. She exhibits no tenderness. Right breast exhibits no inverted nipple, no mass, no nipple discharge, no skin change and no tenderness. Left breast exhibits no inverted nipple, no mass, no nipple discharge, no skin change and no tenderness. Breasts are symmetrical.  Abdominal: Soft. Bowel sounds are normal. She exhibits no distension and no mass.  There is no tenderness. There is no rebound and no guarding.  Musculoskeletal: Normal range of motion. She exhibits no edema or tenderness.  Lymphadenopathy:    She has no cervical adenopathy.  Neurological: She is alert and oriented to person, place, and time. No cranial nerve deficit. She exhibits normal muscle tone. Coordination normal.  Skin: Skin is warm and dry. No rash noted. She is not diaphoretic. No erythema. No pallor.  Psychiatric: She has a normal mood and affect. Her behavior is normal. Judgment and thought content normal.          Assessment & Plan:   Problem List Items Addressed This Visit      Unprioritized   Dyspareunia    Discussed both potential atrophy and scarring from previous XRT and natural atrophy with menopause. Encouraged use of silicone based lubricants and vibrator in between sexual partners to keep area dilated. Follow up prn.      Personal history of rectal cancer    Reviewed notes from oncology. Has follow up in 1 year.      Routine general medical examination at a health care facility - Primary    General medical exam normal today including breast exam. PAP and pelvic deferred as PAP normal, HPV neg in 2014, 2015. Mammogram ordered. Colonoscopy UTD. Labs as ordered. Encouraged healthy diet and exercise.      Relevant Orders   MM Digital Screening   CBC with Differential/Platelet   Comprehensive metabolic panel   Lipid panel   Microalbumin / creatinine urine ratio   Vit D  25 hydroxy (rtn osteoporosis monitoring)   TSH       Return in about 6 months (around 05/02/2015) for Recheck.

## 2014-11-18 ENCOUNTER — Ambulatory Visit: Payer: 59 | Attending: Internal Medicine

## 2015-03-26 ENCOUNTER — Ambulatory Visit
Admission: RE | Admit: 2015-03-26 | Discharge: 2015-03-26 | Disposition: A | Discharge status: Home / Self Care | Payer: 59 | Source: Ambulatory Visit | Attending: Internal Medicine | Admitting: Internal Medicine

## 2015-03-26 ENCOUNTER — Encounter: Payer: Self-pay | Admitting: Internal Medicine

## 2015-03-26 ENCOUNTER — Ambulatory Visit (INDEPENDENT_AMBULATORY_CARE_PROVIDER_SITE_OTHER): Payer: 59 | Admitting: Internal Medicine

## 2015-03-26 VITALS — BP 131/70 | HR 81 | Temp 98.0°F | Ht 63.5 in | Wt 187.4 lb

## 2015-03-26 DIAGNOSIS — J209 Acute bronchitis, unspecified: Secondary | ICD-10-CM

## 2015-03-26 MED ORDER — HYDROCOD POLST-CPM POLST ER 10-8 MG/5ML PO SUER: 5.0000 mL | Freq: Two times a day (BID) | ORAL | Status: DC | PRN

## 2015-03-26 MED ORDER — AMOXICILLIN-POT CLAVULANATE 875-125 MG PO TABS: 1.0000 | ORAL_TABLET | Freq: Two times a day (BID) | ORAL | Status: DC

## 2015-03-26 MED ORDER — PREDNISONE 10 MG PO TABS: ORAL_TABLET | ORAL | Status: DC

## 2015-03-26 NOTE — Assessment & Plan Note (Addendum)
Symptoms most consistent with bronchitis. Will start Prednisone taper, Augmentin. Tussionex for cough. CXR today. Return precautions given. Follow up recheck in 2 weeks.

## 2015-03-26 NOTE — Progress Notes (Signed)
Pre visit review using our clinic review tool, if applicable. No additional management support is needed unless otherwise documented below in the visit note. 

## 2015-03-26 NOTE — Patient Instructions (Signed)
Start Augmentin twice daily.  Start Prednisone taper.  Use Tussionex as needed for cough.  Chest xray today.  Follow up 2 weeks.

## 2015-03-26 NOTE — Progress Notes (Signed)
Subjective:    Patient ID: [name], female    DOB: 1951-02-05, 65 y.o.   MRN: [id number]  HPI  65YO female presents for acute visit.  Cough, congestion - Started in November. Notes several coworkers were sick at that time. Initially had sore throat, nasal congestion, cough. Symptoms never really improved. Continues to have cough productive of thick mucous. Tried taking Mucinex with no improvement. No recent fever. Feels short of breath at times. Cough is worse at night.   Wt Readings from Last 3 Encounters:  03/26/15 187 lb 6 oz (84.993 kg)  10/30/14 192 lb (87.091 kg)  05/17/14 188 lb 12.8 oz (85.639 kg)   BP Readings from Last 3 Encounters:  03/26/15 131/70  10/30/14 150/74  05/17/14 148/86    Past Medical History  Diagnosis Date  . Blood in stool   . Diverticulitis   . Hypertension   . Colon polyp   . Cancer [facility]) 2005    rectal, Dr. Claybon Jabs, Dr. Mallie Snooks, Dr. Leamon Arnt, s/p XRT  . Heart murmur    Family History  Problem Relation Age of Onset  . Cancer Mother     colon  . Hyperlipidemia Mother   . Hypertension Mother   . Hyperlipidemia Father   . Hypertension Father   . Diabetes Father   . Heart disease Father   . Hypertension Other    Past Surgical History  Procedure Laterality Date  . Rectal cancer  2005    removal of lesion  . Abdominal hysterectomy  1993    ovaries intact   Social History   Social History  . Marital Status: Divorced    Spouse Name: N/A  . Number of Children: N/A  . Years of Education: N/A   Social History Main Topics  . Smoking status: Never Smoker   . Smokeless tobacco: None  . Alcohol Use: Yes  . Drug Use: No  . Sexual Activity: Not Asked   Other Topics Concern  . None   Social History Narrative   Lives in [place]. Separate from husband. 2 children.      Work Occupational psychologist at [employer].   Diet - Healthy   Exercise - none regular    Review of Systems  Constitutional: Positive for fatigue. Negative for fever, chills  and unexpected weight change.  HENT: Positive for congestion, postnasal drip and rhinorrhea. Negative for ear discharge, ear pain, facial swelling, hearing loss, mouth sores, nosebleeds, sinus pressure, sneezing, sore throat, tinnitus, trouble swallowing and voice change.   Eyes: Negative for pain, discharge, redness and visual disturbance.  Respiratory: Positive for cough, shortness of breath and wheezing. Negative for chest tightness and stridor.   Cardiovascular: Negative for chest pain, palpitations and leg swelling.  Musculoskeletal: Negative for myalgias, arthralgias, neck pain and neck stiffness.  Skin: Negative for color change and rash.  Neurological: Negative for dizziness, weakness, light-headedness and headaches.  Hematological: Negative for adenopathy.  Psychiatric/Behavioral: Positive for sleep disturbance.       Objective:    BP 131/70 mmHg  Pulse 81  Temp(Src) 98 F (36.7 C) (Oral)  Ht 5' 3.5" (1.613 m)  Wt 187 lb 6 oz (84.993 kg)  BMI 32.67 kg/m2  SpO2 98% Physical Exam  Constitutional: She is oriented to person, place, and time. She appears well-developed and well-nourished. No distress.  HENT:  Head: Normocephalic and atraumatic.  Right Ear: External ear normal.  Left Ear: External ear normal.  Nose: Nose normal.  Mouth/Throat: Oropharynx is clear  and moist. No oropharyngeal exudate or posterior oropharyngeal erythema.  Eyes: Conjunctivae are normal. Pupils are equal, round, and reactive to light. Right eye exhibits no discharge. Left eye exhibits no discharge. No scleral icterus.  Neck: Normal range of motion. Neck supple. No tracheal deviation present. No thyromegaly present.  Cardiovascular: Normal rate, regular rhythm, normal heart sounds and intact distal pulses.  Exam reveals no gallop and no friction rub.   No murmur heard. Pulmonary/Chest: Effort normal. No accessory muscle usage. No tachypnea. No respiratory distress. She has no decreased breath sounds.  She has wheezes. She has rhonchi (scattered). She has no rales. She exhibits no tenderness.  Musculoskeletal: Normal range of motion. She exhibits no edema or tenderness.  Lymphadenopathy:    She has no cervical adenopathy.  Neurological: She is alert and oriented to person, place, and time. No cranial nerve deficit. She exhibits normal muscle tone. Coordination normal.  Skin: Skin is warm and dry. No rash noted. She is not diaphoretic. No erythema. No pallor.  Psychiatric: She has a normal mood and affect. Her behavior is normal. Judgment and thought content normal.          Assessment & Plan:   Problem List Items Addressed This Visit      Unprioritized   Acute bronchitis - Primary    Symptoms most consistent with bronchitis. Will start Prednisone taper, Augmentin. Tussionex for cough. CXR today. Return precautions given. Follow up recheck in 2 weeks.      Relevant Medications   amoxicillin-clavulanate (AUGMENTIN) 875-125 MG tablet   predniSONE (DELTASONE) 10 MG tablet   chlorpheniramine-HYDROcodone (TUSSIONEX PENNKINETIC ER) 10-8 MG/5ML SUER   Other Relevant Orders   DG Chest 2 View       Return in about 2 weeks (around 04/09/2015).

## 2015-04-09 ENCOUNTER — Encounter: Payer: Self-pay | Admitting: Internal Medicine

## 2015-04-09 ENCOUNTER — Ambulatory Visit (INDEPENDENT_AMBULATORY_CARE_PROVIDER_SITE_OTHER): Payer: 59 | Admitting: Internal Medicine

## 2015-04-09 VITALS — BP 142/82 | HR 73 | Temp 97.9°F | Ht 64.0 in | Wt 185.5 lb

## 2015-04-09 DIAGNOSIS — J209 Acute bronchitis, unspecified: Secondary | ICD-10-CM

## 2015-04-09 DIAGNOSIS — I10 Essential (primary) hypertension: Secondary | ICD-10-CM

## 2015-04-09 NOTE — Assessment & Plan Note (Signed)
Symptoms have resolved. Will continue to monitor. 

## 2015-04-09 NOTE — Progress Notes (Signed)
Pre visit review using our clinic review tool, if applicable. No additional management support is needed unless otherwise documented below in the visit note. 

## 2015-04-09 NOTE — Assessment & Plan Note (Signed)
BP Readings from Last 3 Encounters:  04/09/15 142/82  03/26/15 131/70  10/30/14 150/74   BP slightly elevated today. She prefers to do labs with yearly physical in July. Will continue Losartan and Amlodipine. Follow up 09/2015 and prn.

## 2015-04-09 NOTE — Progress Notes (Signed)
Subjective:    Patient ID: Diamond Hill, female    DOB: 1951-01-20, 65 y.o.   MRN: [id number]  HPI  65YO female presents for follow up.  Recently treated for bronchitis with Prednisone and Augmentin. Feeling much better. No dyspnea. Cough has resolved. No new concerns today.  Wt Readings from Last 3 Encounters:  04/09/15 185 lb 8 oz (84.142 kg)  03/26/15 187 lb 6 oz (84.993 kg)  10/30/14 192 lb (87.091 kg)   BP Readings from Last 3 Encounters:  04/09/15 142/82  03/26/15 131/70  10/30/14 150/74    Past Medical History  Diagnosis Date  . Blood in stool   . Diverticulitis   . Hypertension   . Colon polyp   . Cancer [facility]) 2005    rectal, Dr. Claybon Jabs, Dr. Mallie Snooks, Dr. Leamon Arnt, s/p XRT  . Heart murmur    Family History  Problem Relation Age of Onset  . Cancer Mother     colon  . Hyperlipidemia Mother   . Hypertension Mother   . Hyperlipidemia Father   . Hypertension Father   . Diabetes Father   . Heart disease Father   . Hypertension Other    Past Surgical History  Procedure Laterality Date  . Rectal cancer  2005    removal of lesion  . Abdominal hysterectomy  1993    ovaries intact   Social History   Social History  . Marital Status: Divorced    Spouse Name: N/A  . Number of Children: N/A  . Years of Education: N/A   Social History Main Topics  . Smoking status: Never Smoker   . Smokeless tobacco: None  . Alcohol Use: Yes  . Drug Use: No  . Sexual Activity: Not Asked   Other Topics Concern  . None   Social History Narrative   Lives in [place]. Separate from husband. 2 children.      Work Occupational psychologist at [employer].   Diet - Healthy   Exercise - none regular    Review of Systems  Constitutional: Negative for fever, chills, appetite change, fatigue and unexpected weight change.  Eyes: Negative for visual disturbance.  Respiratory: Negative for cough, chest tightness, shortness of breath and wheezing.   Cardiovascular: Negative for chest pain and  leg swelling.  Gastrointestinal: Negative for abdominal pain.  Skin: Negative for color change and rash.  Hematological: Negative for adenopathy. Does not bruise/bleed easily.  Psychiatric/Behavioral: Negative for dysphoric mood. The patient is not nervous/anxious.        Objective:    BP 142/82 mmHg  Pulse 73  Temp(Src) 97.9 F (36.6 C) (Oral)  Ht 5\' 4"  (1.626 m)  Wt 185 lb 8 oz (84.142 kg)  BMI 31.83 kg/m2  SpO2 99% Physical Exam  Constitutional: She is oriented to person, place, and time. She appears well-developed and well-nourished. No distress.  HENT:  Head: Normocephalic and atraumatic.  Right Ear: External ear normal.  Left Ear: External ear normal.  Nose: Nose normal.  Mouth/Throat: Oropharynx is clear and moist. No oropharyngeal exudate.  Eyes: Conjunctivae are normal. Pupils are equal, round, and reactive to light. Right eye exhibits no discharge. Left eye exhibits no discharge. No scleral icterus.  Neck: Normal range of motion. Neck supple. No tracheal deviation present. No thyromegaly present.  Cardiovascular: Normal rate, regular rhythm, normal heart sounds and intact distal pulses.  Exam reveals no gallop and no friction rub.   No murmur heard. Pulmonary/Chest: Effort normal and breath sounds normal. No  respiratory distress. She has no wheezes. She has no rales. She exhibits no tenderness.  Musculoskeletal: Normal range of motion. She exhibits no edema or tenderness.  Lymphadenopathy:    She has no cervical adenopathy.  Neurological: She is alert and oriented to person, place, and time. No cranial nerve deficit. She exhibits normal muscle tone. Coordination normal.  Skin: Skin is warm and dry. No rash noted. She is not diaphoretic. No erythema. No pallor.  Psychiatric: She has a normal mood and affect. Her behavior is normal. Judgment and thought content normal.          Assessment & Plan:   Problem List Items Addressed This Visit      Unprioritized    Acute bronchitis - Primary    Symptoms have resolved. Will continue to monitor.      Hypertension    BP Readings from Last 3 Encounters:  04/09/15 142/82  03/26/15 131/70  10/30/14 150/74   BP slightly elevated today. She prefers to do labs with yearly physical in July. Will continue Losartan and Amlodipine. Follow up 09/2015 and prn.          Return in about 5 months (around 09/06/2015) for Physical.

## 2015-04-09 NOTE — Patient Instructions (Signed)
Physical in July 2017

## 2015-04-13 ENCOUNTER — Other Ambulatory Visit: Payer: Self-pay | Admitting: Internal Medicine

## 2015-04-18 ENCOUNTER — Encounter: Payer: Self-pay | Admitting: Internal Medicine

## 2015-04-30 ENCOUNTER — Other Ambulatory Visit: Payer: Self-pay | Admitting: Internal Medicine

## 2015-04-30 ENCOUNTER — Ambulatory Visit
Admission: RE | Admit: 2015-04-30 | Discharge: 2015-04-30 | Disposition: A | Discharge status: Home / Self Care | Payer: 59 | Source: Ambulatory Visit | Attending: Internal Medicine | Admitting: Internal Medicine

## 2015-04-30 DIAGNOSIS — Z0000 Encounter for general adult medical examination without abnormal findings: Secondary | ICD-10-CM

## 2015-04-30 DIAGNOSIS — Z1231 Encounter for screening mammogram for malignant neoplasm of breast: Secondary | ICD-10-CM

## 2015-04-30 LAB — HM MAMMOGRAPHY

## 2015-06-02 ENCOUNTER — Other Ambulatory Visit: Payer: Self-pay

## 2015-06-03 ENCOUNTER — Ambulatory Visit (INDEPENDENT_AMBULATORY_CARE_PROVIDER_SITE_OTHER): Payer: 59 | Admitting: Family Medicine

## 2015-06-03 ENCOUNTER — Ambulatory Visit: Payer: 59 | Admitting: Family Medicine

## 2015-06-03 ENCOUNTER — Encounter: Payer: Self-pay | Admitting: Family Medicine

## 2015-06-03 VITALS — BP 130/86 | HR 90 | Temp 98.4°F | Ht 64.0 in | Wt 183.0 lb

## 2015-06-03 DIAGNOSIS — R1030 Lower abdominal pain, unspecified: Secondary | ICD-10-CM | POA: Diagnosis not present

## 2015-06-03 DIAGNOSIS — N3001 Acute cystitis with hematuria: Secondary | ICD-10-CM | POA: Diagnosis not present

## 2015-06-03 DIAGNOSIS — N390 Urinary tract infection, site not specified: Secondary | ICD-10-CM | POA: Insufficient documentation

## 2015-06-03 DIAGNOSIS — R300 Dysuria: Secondary | ICD-10-CM | POA: Diagnosis not present

## 2015-06-03 LAB — URINALYSIS, MICROSCOPIC ONLY: RBC / HPF: NONE SEEN (ref 0–?)

## 2015-06-03 LAB — POCT URINALYSIS DIPSTICK
Bilirubin, UA: NEGATIVE
Glucose, UA: NEGATIVE
Ketones, UA: NEGATIVE
Nitrite, UA: NEGATIVE
PROTEIN UA: NEGATIVE
SPEC GRAV UA: 1.025
UROBILINOGEN UA: 0.2
pH, UA: 6

## 2015-06-03 MED ORDER — CEPHALEXIN 500 MG PO CAPS: 500.0000 mg | ORAL_CAPSULE | Freq: Two times a day (BID) | ORAL | Status: DC

## 2015-06-03 NOTE — Patient Instructions (Signed)
Nice to meet you. You likely had the flu. You should continue to monitor for recurrence of her symptoms. Your lower abdominal discomfort is likely related to UTI. We will treat you with Keflex. If you develop abdominal pain, fevers, worsening back pain, blood in her stool, or any new or changing symptoms please seek medical attention.

## 2015-06-03 NOTE — Progress Notes (Signed)
Patient ID: [name], female   DOB: 1950/12/12, 65 y.o.   MRN: [id number]  Tommi Rumps, MD Phone: [phone]  Diamond Hill is a 65 y.o. female who presents today for same day visit.  Patient notes she feels as though she had the flu last week. She developed fever and body aches and chills. Also accompanied by cough and headache. Minimal rhinorrhea. Notes her fever broke on Saturday. She is feeling significantly better this time with regards to this. She notes several days ago she did develop some diarrhea. Notes that anytime she eats she ends up having loose stools within 5 minutes of eating. She notes a lower abdominal nagging sensation as well and she also has a nagging sensation in her lower back. Feels she might have a UTI. She notes these are both about the same since yesterday. No fevers. No dysuria. No urinary frequency. No urinary urgency. No vaginal discharge or itching. No constipation. No nausea. No vomiting. No fevers since the stomach discomfort started. She still has her ovaries and appendix. She has a history of rectal cancer.  PMH: nonsmoker.   ROS see history of present illness  Objective  Physical Exam Filed Vitals:   06/03/15 0758  BP: 130/86  Pulse: 90  Temp: 98.4 F (36.9 C)    BP Readings from Last 3 Encounters:  06/03/15 130/86  04/09/15 142/82  03/26/15 131/70   Wt Readings from Last 3 Encounters:  06/03/15 183 lb (83.008 kg)  04/09/15 185 lb 8 oz (84.142 kg)  03/26/15 187 lb 6 oz (84.993 kg)    Physical Exam  Constitutional: She is well-developed, well-nourished, and in no distress.  HENT:  Head: Normocephalic and atraumatic.  Right Ear: External ear normal.  Left Ear: External ear normal.  Mouth/Throat: Oropharynx is clear and moist.  Eyes: Conjunctivae are normal. Pupils are equal, round, and reactive to light.  Neck: Neck supple.  Cardiovascular: Normal rate, regular rhythm and normal heart sounds.  Exam reveals no gallop and  no friction rub.   No murmur heard. Pulmonary/Chest: Effort normal and breath sounds normal. No respiratory distress. She has no wheezes. She has no rales.  Abdominal: Soft. Bowel sounds are normal. She exhibits no distension. There is no tenderness. There is no rebound and no guarding.  Musculoskeletal: She exhibits no edema.  No midline spine tenderness, no midline spine step-off, no muscular back tenderness  Lymphadenopathy:    She has no cervical adenopathy.  Neurological: She is alert. Gait normal.  5 out of 5 strength in bilateral quads, hamstrings, plantar flexion, and dorsiflexion, sensation to light touch intact in bilateral lower extremities, absent patellar reflexes bilaterally  Skin: Skin is warm and dry. She is not diaphoretic.     Assessment/Plan: Please see individual problem list.  UTI (urinary tract infection) Lower abdominal symptoms likely related to UTI given UA findings consistent with UTI. He has a benign abdominal exam. The nagging discomfort could also be related to her having diarrhea related to her recent viral illness. We will cover for UTI with Keflex. We will send urine for culture and microscopy. She will start on a probiotic or yogurt to help with her diarrhea and prevent further diarrhea. Her flulike symptoms are improved at this time she'll continue to monitor for recurrence. She is given return precautions.    Orders Placed This Encounter  Procedures  . Urine Culture  . Urine Microscopic Only  . POCT Urinalysis Dipstick    Meds ordered this encounter  Medications  . cephALEXin (KEFLEX) 500 MG capsule    Sig: Take 1 capsule (500 mg total) by mouth 2 (two) times daily.    Dispense:  14 capsule    Refill:  0   Tommi Rumps, MD [place]

## 2015-06-03 NOTE — Progress Notes (Signed)
Pre visit review using our clinic review tool, if applicable. No additional management support is needed unless otherwise documented below in the visit note. 

## 2015-06-03 NOTE — Assessment & Plan Note (Signed)
Lower abdominal symptoms likely related to UTI given UA findings consistent with UTI. He has a benign abdominal exam. The nagging discomfort could also be related to her having diarrhea related to her recent viral illness. We will cover for UTI with Keflex. We will send urine for culture and microscopy. She will start on a probiotic or yogurt to help with her diarrhea and prevent further diarrhea. Her flulike symptoms are improved at this time she'll continue to monitor for recurrence. She is given return precautions.

## 2015-06-04 LAB — URINE CULTURE: Colony Count: 4000

## 2015-09-16 ENCOUNTER — Ambulatory Visit (INDEPENDENT_AMBULATORY_CARE_PROVIDER_SITE_OTHER): Payer: 59 | Admitting: Internal Medicine

## 2015-09-16 ENCOUNTER — Encounter: Payer: Self-pay | Admitting: Internal Medicine

## 2015-09-16 VITALS — BP 132/90 | HR 72 | Ht 64.0 in | Wt 190.2 lb

## 2015-09-16 DIAGNOSIS — I10 Essential (primary) hypertension: Secondary | ICD-10-CM | POA: Diagnosis not present

## 2015-09-16 DIAGNOSIS — Z0000 Encounter for general adult medical examination without abnormal findings: Secondary | ICD-10-CM | POA: Diagnosis not present

## 2015-09-16 LAB — CBC WITH DIFFERENTIAL/PLATELET
BASOS PCT: 0.6 % (ref 0.0–3.0)
Basophils Absolute: 0 10*3/uL (ref 0.0–0.1)
EOS PCT: 4.2 % (ref 0.0–5.0)
Eosinophils Absolute: 0.2 10*3/uL (ref 0.0–0.7)
HCT: 41 % (ref 36.0–46.0)
HEMOGLOBIN: 13.7 g/dL (ref 12.0–15.0)
LYMPHS ABS: 2.5 10*3/uL (ref 0.7–4.0)
Lymphocytes Relative: 45.7 % (ref 12.0–46.0)
MCHC: 33.5 g/dL (ref 30.0–36.0)
MCV: 90.8 fl (ref 78.0–100.0)
MONOS PCT: 7.2 % (ref 3.0–12.0)
Monocytes Absolute: 0.4 10*3/uL (ref 0.1–1.0)
NEUTROS PCT: 42.3 % — AB (ref 43.0–77.0)
Neutro Abs: 2.3 10*3/uL (ref 1.4–7.7)
Platelets: 245 10*3/uL (ref 150.0–400.0)
RBC: 4.51 Mil/uL (ref 3.87–5.11)
RDW: 14.6 % (ref 11.5–15.5)
WBC: 5.5 10*3/uL (ref 4.0–10.5)

## 2015-09-16 LAB — COMPREHENSIVE METABOLIC PANEL
ALBUMIN: 4.3 g/dL (ref 3.5–5.2)
ALK PHOS: 49 U/L (ref 39–117)
ALT: 20 U/L (ref 0–35)
AST: 18 U/L (ref 0–37)
BUN: 13 mg/dL (ref 6–23)
CO2: 30 mEq/L (ref 19–32)
Calcium: 9.5 mg/dL (ref 8.4–10.5)
Chloride: 103 mEq/L (ref 96–112)
Creatinine, Ser: 0.76 mg/dL (ref 0.40–1.20)
GFR: 98.84 mL/min (ref >60.00)
Glucose, Bld: 104 mg/dL — ABNORMAL HIGH (ref 70–99)
POTASSIUM: 3.8 meq/L (ref 3.5–5.1)
Sodium: 141 mEq/L (ref 135–145)
TOTAL PROTEIN: 7.3 g/dL (ref 6.0–8.3)
Total Bilirubin: 0.5 mg/dL (ref 0.2–1.2)

## 2015-09-16 LAB — LIPID PANEL
CHOLESTEROL: 177 mg/dL (ref 0–200)
HDL: 85.2 mg/dL (ref >39.00)
LDL Cholesterol: 80 mg/dL (ref 0–99)
NonHDL: 91.7
Total CHOL/HDL Ratio: 2
Triglycerides: 59 mg/dL (ref 0.0–149.0)
VLDL: 11.8 mg/dL (ref 0.0–40.0)

## 2015-09-16 LAB — MICROALBUMIN / CREATININE URINE RATIO
Creatinine,U: 39.9 mg/dL
Microalb Creat Ratio: 1.8 mg/g (ref 0.0–30.0)
Microalb, Ur: <0.7 mg/dL (ref 0.0–1.9)

## 2015-09-16 LAB — TSH: TSH: 2.49 u[IU]/mL (ref 0.35–4.50)

## 2015-09-16 LAB — VITAMIN D 25 HYDROXY (VIT D DEFICIENCY, FRACTURES): VITD: 30.08 ng/mL (ref 30.00–100.00)

## 2015-09-16 MED ORDER — LOSARTAN POTASSIUM 50 MG PO TABS: 50.0000 mg | ORAL_TABLET | Freq: Every day | ORAL | Status: DC

## 2015-09-16 MED ORDER — AMLODIPINE BESYLATE 5 MG PO TABS: 5.0000 mg | ORAL_TABLET | Freq: Every day | ORAL | Status: DC

## 2015-09-16 NOTE — Assessment & Plan Note (Signed)
BP Readings from Last 3 Encounters:  09/16/15 132/90  06/03/15 130/86  04/09/15 142/82   BP slightly elevated. Discussed potential increase in Losartan to 100mg . She prefers to monitor for now.

## 2015-09-16 NOTE — Assessment & Plan Note (Signed)
General medical exam normal today including breast exam. PAP and pelvic deferred as PAP normal in 2015, HPV neg and pt s/p hysterectomy. Mammogram UTD and reviewed. Colonoscopy to be performed at [facility], by pt report. Labs today. Encouraged healthy diet and exercise. Immunizations are UTD.

## 2015-09-16 NOTE — Progress Notes (Signed)
Pre visit review using our clinic review tool, if applicable. No additional management support is needed unless otherwise documented below in the visit note. 

## 2015-09-16 NOTE — Progress Notes (Signed)
Subjective:    Patient ID: Diamond Hill, female    DOB: 05-Nov-1950, 65 y.o.   MRN: [id number]  HPI  65YO female presents physical exam.  Feeling well. No concerns today. Trying to walk more for exercise.  Wt Readings from Last 3 Encounters:  09/16/15 190 lb 3.2 oz (86.274 kg)  06/03/15 183 lb (83.008 kg)  04/09/15 185 lb 8 oz (84.142 kg)   BP Readings from Last 3 Encounters:  09/16/15 132/90  06/03/15 130/86  04/09/15 142/82    Past Medical History  Diagnosis Date  . Blood in stool   . Diverticulitis   . Hypertension   . Colon polyp   . Cancer [facility]) 2005    rectal, Dr. Claybon Jabs, Dr. Mallie Snooks, Dr. Leamon Arnt, s/p XRT  . Heart murmur    Family History  Problem Relation Age of Onset  . Cancer Mother     colon  . Hyperlipidemia Mother   . Hypertension Mother   . Hyperlipidemia Father   . Hypertension Father   . Diabetes Father   . Heart disease Father   . Hypertension Other   . Breast cancer Neg Hx    Past Surgical History  Procedure Laterality Date  . Rectal cancer  2005    removal of lesion  . Abdominal hysterectomy  1993    ovaries intact   Social History   Social History  . Marital Status: Divorced    Spouse Name: N/A  . Number of Children: N/A  . Years of Education: N/A   Social History Main Topics  . Smoking status: Never Smoker   . Smokeless tobacco: None  . Alcohol Use: Yes  . Drug Use: No  . Sexual Activity: Not Asked   Other Topics Concern  . None   Social History Narrative   [place]arate from husband. 2 children.      Work Occupational psychologist at [employer].   Diet - Healthy   Exercise - none regular    Review of Systems  Constitutional: Negative for fever, chills, appetite change, fatigue and unexpected weight change.  HENT: Negative for congestion.   Eyes: Negative for visual disturbance.  Respiratory: Negative for cough and shortness of breath.   Cardiovascular: Negative for chest pain, palpitations and leg swelling.    Gastrointestinal: Negative for nausea, vomiting, abdominal pain, diarrhea and constipation.  Genitourinary: Negative for pelvic pain.  Musculoskeletal: Negative for myalgias and arthralgias.  Skin: Negative for color change and rash.  Neurological: Negative for weakness.  Hematological: Negative for adenopathy. Does not bruise/bleed easily.  Psychiatric/Behavioral: Negative for suicidal ideas, sleep disturbance and dysphoric mood. The patient is not nervous/anxious.        Objective:    BP 132/90 mmHg  Pulse 72  Ht 5\' 4"  (1.626 m)  Wt 190 lb 3.2 oz (86.274 kg)  BMI 32.63 kg/m2  SpO2 98% Physical Exam  Constitutional: She is oriented to person, place, and time. She appears well-developed and well-nourished. No distress.  HENT:  Head: Normocephalic and atraumatic.  Right Ear: External ear normal.  Left Ear: External ear normal.  Nose: Nose normal.  Mouth/Throat: Oropharynx is clear and moist. No oropharyngeal exudate.  Eyes: Conjunctivae are normal. Pupils are equal, round, and reactive to light. Right eye exhibits no discharge. Left eye exhibits no discharge. No scleral icterus.  Neck: Normal range of motion. Neck supple. No tracheal deviation present. No thyromegaly present.  Cardiovascular: Normal rate, regular rhythm, normal heart sounds and intact distal  pulses.  Exam reveals no gallop and no friction rub.   No murmur heard. Pulmonary/Chest: Effort normal and breath sounds normal. No accessory muscle usage. No tachypnea. No respiratory distress. She has no decreased breath sounds. She has no wheezes. She has no rales. She exhibits no tenderness. Right breast exhibits no inverted nipple, no mass, no nipple discharge, no skin change and no tenderness. Left breast exhibits no inverted nipple, no mass, no nipple discharge, no skin change and no tenderness. Breasts are symmetrical.  Abdominal: Soft. Bowel sounds are normal. She exhibits no distension and no mass. There is no  tenderness. There is no rebound and no guarding.  Musculoskeletal: Normal range of motion. She exhibits no edema or tenderness.  Lymphadenopathy:    She has no cervical adenopathy.  Neurological: She is alert and oriented to person, place, and time. No cranial nerve deficit. She exhibits normal muscle tone. Coordination normal.  Skin: Skin is warm and dry. No rash noted. She is not diaphoretic. No erythema. No pallor.  Psychiatric: She has a normal mood and affect. Her behavior is normal. Judgment and thought content normal.          Assessment & Plan:   Problem List Items Addressed This Visit      Unprioritized   Hypertension    BP Readings from Last 3 Encounters:  09/16/15 132/90  06/03/15 130/86  04/09/15 142/82   BP slightly elevated. Discussed potential increase in Losartan to 100mg . She prefers to monitor for now.      Relevant Medications   losartan (COZAAR) 50 MG tablet   amLODipine (NORVASC) 5 MG tablet   Routine general medical examination at a health care facility - Primary    General medical exam normal today including breast exam. PAP and pelvic deferred as PAP normal in 2015, HPV neg and pt s/p hysterectomy. Mammogram UTD and reviewed. Colonoscopy to be performed at [facility], by pt report. Labs today. Encouraged healthy diet and exercise. Immunizations are UTD.      Relevant Orders   CBC with Differential/Platelet   Comprehensive metabolic panel   Lipid panel   Microalbumin / creatinine urine ratio   VITAMIN D 25 Hydroxy (Vit-D Deficiency, Fractures)   TSH   Hepatitis C antibody       Return in about 4 weeks (around 10/14/2015) for New Patient.  Ronette Deter, MD [facility]

## 2015-09-16 NOTE — Patient Instructions (Signed)
Health Maintenance, Female Adopting a healthy lifestyle and getting preventive care can go a long way to promote health and wellness. Talk with your health care provider about what schedule of regular examinations is right for you. This is a good chance for you to check in with your provider about disease prevention and staying healthy. In between checkups, there are plenty of things you can do on your own. Experts have done a lot of research about which lifestyle changes and preventive measures are most likely to keep you healthy. Ask your health care provider for more information. WEIGHT AND DIET  Eat a healthy diet  Be sure to include plenty of vegetables, fruits, low-fat dairy products, and lean protein.  Do not eat a lot of foods high in solid fats, added sugars, or salt.  Get regular exercise. This is one of the most important things you can do for your health.  Most adults should exercise for at least 150 minutes each week. The exercise should increase your heart rate and make you sweat (moderate-intensity exercise).  Most adults should also do strengthening exercises at least twice a week. This is in addition to the moderate-intensity exercise.  Maintain a healthy weight  Body mass index (BMI) is a measurement that can be used to identify possible weight problems. It estimates body fat based on height and weight. Your health care provider can help determine your BMI and help you achieve or maintain a healthy weight.  For females 20 years of age and older:   A BMI below 18.5 is considered underweight.  A BMI of 18.5 to 24.9 is normal.  A BMI of 25 to 29.9 is considered overweight.  A BMI of 30 and above is considered obese.  Watch levels of cholesterol and blood lipids  You should start having your blood tested for lipids and cholesterol at 63 years of age, then have this test every 5 years.  You may need to have your cholesterol levels checked more often if:  Your lipid  or cholesterol levels are high.  You are older than 63 years of age.  You are at high risk for heart disease.  CANCER SCREENING   Lung Cancer  Lung cancer screening is recommended for adults 55-80 years old who are at high risk for lung cancer because of a history of smoking.  A yearly low-dose CT scan of the lungs is recommended for people who:  Currently smoke.  Have quit within the past 15 years.  Have at least a 30-pack-year history of smoking. A pack year is smoking an average of one pack of cigarettes a day for 1 year.  Yearly screening should continue until it has been 15 years since you quit.  Yearly screening should stop if you develop a health problem that would prevent you from having lung cancer treatment.  Breast Cancer  Practice breast self-awareness. This means understanding how your breasts normally appear and feel.  It also means doing regular breast self-exams. Let your health care provider know about any changes, no matter how small.  If you are in your 20s or 30s, you should have a clinical breast exam (CBE) by a health care provider every 1-3 years as part of a regular health exam.  If you are 40 or older, have a CBE every year. Also consider having a breast X-ray (mammogram) every year.  If you have a family history of breast cancer, talk to your health care provider about genetic screening.  If you   are at high risk for breast cancer, talk to your health care provider about having an MRI and a mammogram every year.  Breast cancer gene (BRCA) assessment is recommended for women who have family members with BRCA-related cancers. BRCA-related cancers include:  Breast.  Ovarian.  Tubal.  Peritoneal cancers.  Results of the assessment will determine the need for genetic counseling and BRCA1 and BRCA2 testing. Cervical Cancer Your health care provider may recommend that you be screened regularly for cancer of the pelvic organs (ovaries, uterus, and  vagina). This screening involves a pelvic examination, including checking for microscopic changes to the surface of your cervix (Pap test). You may be encouraged to have this screening done every 3 years, beginning at age 21.  For women ages 30-65, health care providers may recommend pelvic exams and Pap testing every 3 years, or they may recommend the Pap and pelvic exam, combined with testing for human papilloma virus (HPV), every 5 years. Some types of HPV increase your risk of cervical cancer. Testing for HPV may also be done on women of any age with unclear Pap test results.  Other health care providers may not recommend any screening for nonpregnant women who are considered low risk for pelvic cancer and who do not have symptoms. Ask your health care provider if a screening pelvic exam is right for you.  If you have had past treatment for cervical cancer or a condition that could lead to cancer, you need Pap tests and screening for cancer for at least 20 years after your treatment. If Pap tests have been discontinued, your risk factors (such as having a new sexual partner) need to be reassessed to determine if screening should resume. Some women have medical problems that increase the chance of getting cervical cancer. In these cases, your health care provider may recommend more frequent screening and Pap tests. Colorectal Cancer  This type of cancer can be detected and often prevented.  Routine colorectal cancer screening usually begins at 63 years of age and continues through 63 years of age.  Your health care provider may recommend screening at an earlier age if you have risk factors for colon cancer.  Your health care provider may also recommend using home test kits to check for hidden blood in the stool.  A small camera at the end of a tube can be used to examine your colon directly (sigmoidoscopy or colonoscopy). This is done to check for the earliest forms of colorectal  cancer.  Routine screening usually begins at age 50.  Direct examination of the colon should be repeated every 5-10 years through 63 years of age. However, you may need to be screened more often if early forms of precancerous polyps or small growths are found. Skin Cancer  Check your skin from head to toe regularly.  Tell your health care provider about any new moles or changes in moles, especially if there is a change in a mole's shape or color.  Also tell your health care provider if you have a mole that is larger than the size of a pencil eraser.  Always use sunscreen. Apply sunscreen liberally and repeatedly throughout the day.  Protect yourself by wearing long sleeves, pants, a wide-brimmed hat, and sunglasses whenever you are outside. HEART DISEASE, DIABETES, AND HIGH BLOOD PRESSURE   High blood pressure causes heart disease and increases the risk of stroke. High blood pressure is more likely to develop in:  People who have blood pressure in the high end   of the normal range (130-139/85-89 mm Hg).  People who are overweight or obese.  People who are African American.  If you are 38-23 years of age, have your blood pressure checked every 3-5 years. If you are 61 years of age or older, have your blood pressure checked every year. You should have your blood pressure measured twice--once when you are at a hospital or clinic, and once when you are not at a hospital or clinic. Record the average of the two measurements. To check your blood pressure when you are not at a hospital or clinic, you can use:  An automated blood pressure machine at a pharmacy.  A home blood pressure monitor.  If you are between 45 years and 39 years old, ask your health care provider if you should take aspirin to prevent strokes.  Have regular diabetes screenings. This involves taking a blood sample to check your fasting blood sugar level.  If you are at a normal weight and have a low risk for diabetes,  have this test once every three years after 63 years of age.  If you are overweight and have a high risk for diabetes, consider being tested at a younger age or more often. PREVENTING INFECTION  Hepatitis B  If you have a higher risk for hepatitis B, you should be screened for this virus. You are considered at high risk for hepatitis B if:  You were born in a country where hepatitis B is common. Ask your health care provider which countries are considered high risk.  Your parents were born in a high-risk country, and you have not been immunized against hepatitis B (hepatitis B vaccine).  You have HIV or AIDS.  You use needles to inject street drugs.  You live with someone who has hepatitis B.  You have had sex with someone who has hepatitis B.  You get hemodialysis treatment.  You take certain medicines for conditions, including cancer, organ transplantation, and autoimmune conditions. Hepatitis C  Blood testing is recommended for:  Everyone born from [birth year] through 1963.  Anyone with known risk factors for hepatitis C. Sexually transmitted infections (STIs)  You should be screened for sexually transmitted infections (STIs) including gonorrhea and chlamydia if:  You are sexually active and are younger than 63 years of age.  You are older than 63 years of age and your health care provider tells you that you are at risk for this type of infection.  Your sexual activity has changed since you were last screened and you are at an increased risk for chlamydia or gonorrhea. Ask your health care provider if you are at risk.  If you do not have HIV, but are at risk, it may be recommended that you take a prescription medicine daily to prevent HIV infection. This is called pre-exposure prophylaxis (PrEP). You are considered at risk if:  You are sexually active and do not regularly use condoms or know the HIV status of your partner(s).  You take drugs by injection.  You are sexually  active with a partner who has HIV. Talk with your health care provider about whether you are at high risk of being infected with HIV. If you choose to begin PrEP, you should first be tested for HIV. You should then be tested every 3 months for as long as you are taking PrEP.  PREGNANCY   If you are premenopausal and you may become pregnant, ask your health care provider about preconception counseling.  If you may  become pregnant, take 400 to 800 micrograms (mcg) of folic acid every day.  If you want to prevent pregnancy, talk to your health care provider about birth control (contraception). OSTEOPOROSIS AND MENOPAUSE   Osteoporosis is a disease in which the bones lose minerals and strength with aging. This can result in serious bone fractures. Your risk for osteoporosis can be identified using a bone density scan.  If you are 61 years of age or older, or if you are at risk for osteoporosis and fractures, ask your health care provider if you should be screened.  Ask your health care provider whether you should take a calcium or vitamin D supplement to lower your risk for osteoporosis.  Menopause may have certain physical symptoms and risks.  Hormone replacement therapy may reduce some of these symptoms and risks. Talk to your health care provider about whether hormone replacement therapy is right for you.  HOME CARE INSTRUCTIONS   Schedule regular health, dental, and eye exams.  Stay current with your immunizations.   Do not use any tobacco products including cigarettes, chewing tobacco, or electronic cigarettes.  If you are pregnant, do not drink alcohol.  If you are breastfeeding, limit how much and how often you drink alcohol.  Limit alcohol intake to no more than 1 drink per day for nonpregnant women. One drink equals 12 ounces of beer, 5 ounces of wine, or 1 ounces of hard liquor.  Do not use street drugs.  Do not share needles.  Ask your health care provider for help if  you need support or information about quitting drugs.  Tell your health care provider if you often feel depressed.  Tell your health care provider if you have ever been abused or do not feel safe at home.   This information is not intended to replace advice given to you by your health care provider. Make sure you discuss any questions you have with your health care provider.   Document Released: 09/07/2010 Document Revised: 03/15/2014 Document Reviewed: 01/24/2013 Elsevier Interactive Patient Education [employer].

## 2015-09-17 LAB — HEPATITIS C ANTIBODY: HCV Ab: NEGATIVE

## 2015-10-02 ENCOUNTER — Encounter: Payer: Self-pay | Admitting: Internal Medicine

## 2015-10-02 ENCOUNTER — Ambulatory Visit (INDEPENDENT_AMBULATORY_CARE_PROVIDER_SITE_OTHER): Payer: 59 | Admitting: Internal Medicine

## 2015-10-02 VITALS — BP 174/94 | HR 77 | Wt 186.2 lb

## 2015-10-02 DIAGNOSIS — I10 Essential (primary) hypertension: Secondary | ICD-10-CM

## 2015-10-02 DIAGNOSIS — R051 Acute cough: Secondary | ICD-10-CM | POA: Insufficient documentation

## 2015-10-02 DIAGNOSIS — R05 Cough: Secondary | ICD-10-CM | POA: Diagnosis not present

## 2015-10-02 DIAGNOSIS — R059 Cough, unspecified: Secondary | ICD-10-CM | POA: Insufficient documentation

## 2015-10-02 MED ORDER — AMLODIPINE BESYLATE 10 MG PO TABS: 10.0000 mg | ORAL_TABLET | Freq: Every day | ORAL | 1 refills | Status: DC

## 2015-10-02 MED ORDER — BENZONATATE 200 MG PO CAPS: 200.0000 mg | ORAL_CAPSULE | Freq: Two times a day (BID) | ORAL | 0 refills | Status: DC | PRN

## 2015-10-02 MED ORDER — AZITHROMYCIN 250 MG PO TABS: ORAL_TABLET | ORAL | 0 refills | Status: DC

## 2015-10-02 NOTE — Assessment & Plan Note (Signed)
Recent cough with post-tussive emesis. Question pertussis. Will start Azithromycin. Tessalon for cough. Follow up if symptoms are not improving. Consider CXR if no improvement.

## 2015-10-02 NOTE — Progress Notes (Signed)
Pre visit review using our clinic review tool, if applicable. No additional management support is needed unless otherwise documented below in the visit note. 

## 2015-10-02 NOTE — Progress Notes (Signed)
Subjective:    Patient ID: Diamond Hill, female    DOB: [birth year], 65 y.o.   MRN: [id number]  HPI  65YO female presents for acute visit.  Cough - Started mid July. First developed sore throat and then cough. Symptoms improved initially, then worsened over last few days. Cough occasionally productive. Some post-tussive nausea. No fever.  HTN - Feeling more stressed at work. Not sleeping well. Checks BP at home, mostly 160/100s. No CP, HA.  Wt Readings from Last 3 Encounters:  10/02/15 186 lb 3.2 oz (84.5 kg)  09/16/15 190 lb 3.2 oz (86.3 kg)  06/03/15 183 lb (83 kg)   BP Readings from Last 3 Encounters:  10/02/15 (!) 174/94  09/16/15 132/90  06/03/15 130/86    Past Medical History:  Diagnosis Date  . Blood in stool   . Cancer [facility]) 2005   rectal, Dr. Claybon Jabs, Dr. Mallie Snooks, Dr. Leamon Arnt, s/p XRT  . Colon polyp   . Diverticulitis   . Heart murmur   . Hypertension    Family History  Problem Relation Age of Onset  . Cancer Mother     colon  . Hyperlipidemia Mother   . Hypertension Mother   . Hyperlipidemia Father   . Hypertension Father   . Diabetes Father   . Heart disease Father   . Hypertension Other   . Breast cancer Neg Hx    Past Surgical History:  Procedure Laterality Date  . ABDOMINAL HYSTERECTOMY  1993   ovaries intact  . rectal cancer  2005   removal of lesion   Social History   Social History  . Marital status: Divorced    Spouse name: N/A  . Number of children: N/A  . Years of education: N/A   Social History Main Topics  . Smoking status: Never Smoker  . Smokeless tobacco: None  . Alcohol use Yes  . Drug use: No  . Sexual activity: Not Asked   Other Topics Concern  . None   Social History Narrative   Lives in [place]. Separate from husband. 2 children.      Work Occupational psychologist at [employer].   Diet - Healthy   Exercise - none regular    Review of Systems  Constitutional: Negative for appetite change, chills, fatigue, fever and  unexpected weight change.  HENT: Positive for congestion, postnasal drip and sore throat. Negative for rhinorrhea, sinus pressure, trouble swallowing and voice change.   Eyes: Negative for visual disturbance.  Respiratory: Positive for cough. Negative for chest tightness, shortness of breath and wheezing.   Cardiovascular: Negative for chest pain, palpitations and leg swelling.  Gastrointestinal: Positive for nausea. Negative for abdominal pain, constipation, diarrhea and vomiting.  Skin: Negative for color change and rash.  Neurological: Negative for headaches.  Hematological: Negative for adenopathy. Does not bruise/bleed easily.  Psychiatric/Behavioral: Positive for sleep disturbance. Negative for dysphoric mood. The patient is nervous/anxious.        Objective:    BP (!) 174/94 (BP Location: Right Arm, Patient Position: Sitting, Cuff Size: Large)   Pulse 77   Wt 186 lb 3.2 oz (84.5 kg)   SpO2 99%   BMI 31.96 kg/m  Physical Exam  Constitutional: She is oriented to person, place, and time. She appears well-developed and well-nourished. No distress.  HENT:  Head: Normocephalic and atraumatic.  Right Ear: External ear normal.  Left Ear: External ear normal.  Nose: Nose normal.  Mouth/Throat: Oropharynx is clear and moist. No oropharyngeal exudate.  Eyes:  Conjunctivae are normal. Pupils are equal, round, and reactive to light. Right eye exhibits no discharge. Left eye exhibits no discharge. No scleral icterus.  Neck: Normal range of motion. Neck supple. No tracheal deviation present. No thyromegaly present.  Cardiovascular: Normal rate, regular rhythm, normal heart sounds and intact distal pulses.  Exam reveals no gallop and no friction rub.   No murmur heard. Pulmonary/Chest: Effort normal and breath sounds normal. No accessory muscle usage. No tachypnea. No respiratory distress. She has no decreased breath sounds. She has no wheezes. She has no rhonchi. She has no rales. She  exhibits no tenderness.  Musculoskeletal: Normal range of motion. She exhibits no edema or tenderness.  Lymphadenopathy:    She has no cervical adenopathy.  Neurological: She is alert and oriented to person, place, and time. No cranial nerve deficit. She exhibits normal muscle tone. Coordination normal.  Skin: Skin is warm and dry. No rash noted. She is not diaphoretic. No erythema. No pallor.  Psychiatric: She has a normal mood and affect. Her behavior is normal. Judgment and thought content normal.          Assessment & Plan:   Problem List Items Addressed This Visit      Unprioritized   Cough - Primary (Chronic)    Recent cough with post-tussive emesis. Question pertussis. Will start Azithromycin. Tessalon for cough. Follow up if symptoms are not improving. Consider CXR if no improvement.      Hypertension    BP Readings from Last 3 Encounters:  10/02/15 (!) 174/94  09/16/15 132/90  06/03/15 130/86   BP elevated. Will increase Amlodipine to 10mg  daily. Recheck BP next week. She will also monitor at home and email.      Relevant Medications   amLODipine (NORVASC) 10 MG tablet    Other Visit Diagnoses   None.      Return if symptoms worsen or fail to improve.  Ronette Deter, MD [facility]

## 2015-10-02 NOTE — Patient Instructions (Addendum)
Increase Amlodipine to 10mg  daily.  Start Azithromycin to help treat potential Pertussis.  Use Tessalon as needed for cough.  If cough is not improved next week, please email and we will order a chest xray.

## 2015-10-02 NOTE — Assessment & Plan Note (Signed)
BP Readings from Last 3 Encounters:  10/02/15 (!) 174/94  09/16/15 132/90  06/03/15 130/86   BP elevated. Will increase Amlodipine to 10mg  daily. Recheck BP next week. She will also monitor at home and email.

## 2015-10-08 ENCOUNTER — Ambulatory Visit (INDEPENDENT_AMBULATORY_CARE_PROVIDER_SITE_OTHER): Payer: 59

## 2015-10-08 VITALS — BP 138/80 | HR 74 | Resp 18

## 2015-10-08 DIAGNOSIS — I10 Essential (primary) hypertension: Secondary | ICD-10-CM | POA: Diagnosis not present

## 2015-10-08 NOTE — Progress Notes (Signed)
Patient came in for BP recheck.  Checked bilateral upper extremities, see vitals for details.  Patient has been taking the increased dose of lisinopril (10mg ) and having no issues at home.  Has been checking daily BP's, not available to give you.  Please advise, thanks

## 2015-10-09 NOTE — Progress Notes (Signed)
Continue Losartan 50mg  daily and Amlodipine 10mg  daily. Follow up in 1-3 months.

## 2015-10-15 ENCOUNTER — Ambulatory Visit (INDEPENDENT_AMBULATORY_CARE_PROVIDER_SITE_OTHER): Payer: 59 | Admitting: Family Medicine

## 2015-10-15 ENCOUNTER — Encounter: Payer: Self-pay | Admitting: Family Medicine

## 2015-10-15 DIAGNOSIS — I10 Essential (primary) hypertension: Secondary | ICD-10-CM

## 2015-10-15 DIAGNOSIS — Z85048 Personal history of other malignant neoplasm of rectum, rectosigmoid junction, and anus: Secondary | ICD-10-CM

## 2015-10-15 NOTE — Patient Instructions (Signed)
It was nice to see you today.  You're doing great.  Continue your medications.   Follow up:  6 months.  Take care  Dr. Lacinda Axon

## 2015-10-15 NOTE — Assessment & Plan Note (Signed)
No released from Onc. Has upcoming colonoscopy. Doing well.

## 2015-10-15 NOTE — Assessment & Plan Note (Addendum)
Stable. Mildly elevated today (due to stress per patient). Continuing amlodipine and losartan.

## 2015-10-15 NOTE — Progress Notes (Signed)
Pre visit review using our clinic review tool, if applicable. No additional management support is needed unless otherwise documented below in the visit note. 

## 2015-10-15 NOTE — Progress Notes (Signed)
   Subjective:  Patient ID: Diamond Hill, female    DOB: 11-19-1950  Age: 65 y.o. MRN: [id number]  CC: Follow up, establish care with me.  HPI:  65 year old female with HTN, Anxiety, prior hx of rectal cancer presents for follow up and to establish with given Dr. Thomes Dinning move.  HTN  Stable.  Compliant with losartan and amlodipine.  No complaints at this time.  History of rectal cancer  Doing well at this time.  Has been released from oncology as she is 10 years out.  Has upcoming colonoscopy.  Social Hx   Social History   Social History  . Marital status: Divorced    Spouse name: N/A  . Number of children: N/A  . Years of education: N/A   Social History Main Topics  . Smoking status: Never Smoker  . Smokeless tobacco: None  . Alcohol use Yes  . Drug use: No  . Sexual activity: Not Asked   Other Topics Concern  . None   Social History Narrative   Lives in [place]. Separate from husband. 2 children.      Work Occupational psychologist at [employer].   Diet - Healthy   Exercise - none regular    Review of Systems  Constitutional: Negative.   Respiratory: Negative.   Cardiovascular: Negative.    Objective:  BP (!) 151/84 (BP Location: Left Arm, Patient Position: Sitting, Cuff Size: Large)   Pulse 80   Temp 98.6 F (37 C) (Oral)   Wt 190 lb 8 oz (86.4 kg)   SpO2 100%   BMI 32.70 kg/m   BP/Weight 10/15/2015 10/08/2015 [id number]  Systolic BP [id number]  Diastolic BP 84 80 94  Wt. (Lbs) 190.5 - 186.2  BMI 32.7 - 31.96   Physical Exam  Constitutional: She is oriented to person, place, and time. She appears well-developed. No distress.  Cardiovascular: Normal rate and regular rhythm.   Pulmonary/Chest: Effort normal. She has no wheezes. She has no rales.  Abdominal: She exhibits no distension.  Neurological: She is alert and oriented to person, place, and time.  Psychiatric: She has a normal mood and affect.  Vitals reviewed.  Lab Results  Component Value Date     WBC 5.5 09/16/2015   HGB 13.7 09/16/2015   HCT 41.0 09/16/2015   PLT 245.0 09/16/2015   GLUCOSE 104 (H) 09/16/2015   CHOL 177 09/16/2015   TRIG 59.0 09/16/2015   HDL 85.20 09/16/2015   LDLDIRECT 92.7 09/06/2011   LDLCALC 80 09/16/2015   ALT 20 09/16/2015   AST 18 09/16/2015   NA 141 09/16/2015   K 3.8 09/16/2015   CL 103 09/16/2015   CREATININE 0.76 09/16/2015   BUN 13 09/16/2015   CO2 30 09/16/2015   TSH 2.49 09/16/2015   MICROALBUR <0.7 09/16/2015    Assessment & Plan:   Problem List Items Addressed This Visit    Hypertension    Stable. Mildly elevated today (due to stress per patient). Continuing amlodipine and losartan.       Personal history of rectal cancer    No released from Onc. Has upcoming colonoscopy. Doing well.        Other Visit Diagnoses   None.    Follow-up: 6 months.  Sharon

## 2015-10-16 ENCOUNTER — Telehealth: Payer: Self-pay | Admitting: Family Medicine

## 2015-10-16 NOTE — Telephone Encounter (Signed)
Patient is stating that when she breaths she is coughing. Cough started yesterday. patient thinks she may have asthma. Patient stated that this maybe coming from her being around a lot of boxes at her work. Stated when going outside it is better. Patient says she is going home and was advised to call back at 1 p.m to see if the cough got a little better or worse.

## 2015-11-27 ENCOUNTER — Telehealth: Payer: Self-pay | Admitting: Family Medicine

## 2015-11-27 NOTE — Telephone Encounter (Signed)
Left msg on pt vm to call office to sch flu shot.

## 2015-12-22 ENCOUNTER — Ambulatory Visit (INDEPENDENT_AMBULATORY_CARE_PROVIDER_SITE_OTHER): Payer: 59 | Admitting: Family Medicine

## 2015-12-22 ENCOUNTER — Encounter: Payer: Self-pay | Admitting: Family Medicine

## 2015-12-22 DIAGNOSIS — J209 Acute bronchitis, unspecified: Secondary | ICD-10-CM

## 2015-12-22 MED ORDER — ALBUTEROL SULFATE HFA 108 (90 BASE) MCG/ACT IN AERS: 2.0000 | INHALATION_SPRAY | Freq: Four times a day (QID) | RESPIRATORY_TRACT | 0 refills | Status: DC | PRN

## 2015-12-22 MED ORDER — HYDROCOD POLST-CPM POLST ER 10-8 MG/5ML PO SUER: 5.0000 mL | Freq: Two times a day (BID) | ORAL | 0 refills | Status: DC | PRN

## 2015-12-22 MED ORDER — PREDNISONE 50 MG PO TABS: ORAL_TABLET | ORAL | 0 refills | Status: DC

## 2015-12-22 NOTE — Assessment & Plan Note (Signed)
Patient has had this in the past. Current symptoms consistent with acute bronchitis. Questionable allergic versus asthmatic component. Treating with prednisone, Tussionex, albuterol. Advise PFT's after she's better. Patient to contemplate.

## 2015-12-22 NOTE — Progress Notes (Signed)
Subjective:  Patient ID: Diamond Hill, female    DOB: [birth year]  Age: 65 y.o. MRN: [id number]  CC: Cough  HPI:  65 year old female with hypertension, anxiety, and prior history of rectal cancer presents with complaints of cough.  Cough  Patient reports that last week she developed cough and wheezing. Symptoms are moderate in severity.  She states that it occurred after she was outside planting flowers.  She reports the cough is mildly productive.  No associated fever.  She does report associated mild shortness of breath.  No known exacerbating or relieving factors. No other associated symptoms. No other complaints at this time.  Social Hx   Social History   Social History  . Marital status: Divorced    Spouse name: N/A  . Number of children: N/A  . Years of education: N/A   Social History Main Topics  . Smoking status: Never Smoker  . Smokeless tobacco: None  . Alcohol use Yes  . Drug use: No  . Sexual activity: Not Asked   Other Topics Concern  . None   Social History Narrative   Lives in [place]. Separate from husband. 2 children.      Work Occupational psychologist at [employer].   Diet - Healthy   Exercise - none regular    Review of Systems  Constitutional: Negative for fever.  Respiratory: Positive for cough.    Objective:  BP (!) 162/94 (BP Location: Right Arm, Patient Position: Sitting, Cuff Size: Large)   Pulse 94   Temp 98.2 F (36.8 C) (Oral)   Wt 187 lb 4 oz (84.9 kg)   SpO2 93%   BMI 32.14 kg/m   BP/Weight 12/22/2015 [id number]  Systolic BP 0000000 [id number] 0000000  Diastolic BP 94 84 80  Wt. (Lbs) 187.25 190.5 -  BMI 32.14 32.7 -   Physical Exam  Constitutional: She is oriented to person, place, and time. She appears well-developed. No distress.  HENT:  Mouth/Throat: Oropharynx is clear and moist.  TMs obscured by cerumen bilaterally.  Cardiovascular: Normal rate and regular rhythm.   2/6 systolic murmur.  Pulmonary/Chest:  Scattered mild  wheezing.  Neurological: She is alert and oriented to person, place, and time.  Psychiatric: She has a normal mood and affect.  Vitals reviewed.  Lab Results  Component Value Date   WBC 5.5 09/16/2015   HGB 13.7 09/16/2015   HCT 41.0 09/16/2015   PLT 245.0 09/16/2015   GLUCOSE 104 (H) 09/16/2015   CHOL 177 09/16/2015   TRIG 59.0 09/16/2015   HDL 85.20 09/16/2015   LDLDIRECT 92.7 09/06/2011   LDLCALC 80 09/16/2015   ALT 20 09/16/2015   AST 18 09/16/2015   NA 141 09/16/2015   K 3.8 09/16/2015   CL 103 09/16/2015   CREATININE 0.76 09/16/2015   BUN 13 09/16/2015   CO2 30 09/16/2015   TSH 2.49 09/16/2015   MICROALBUR <0.7 09/16/2015    Assessment & Plan:   Problem List Items Addressed This Visit    Acute bronchitis    Patient has had this in the past. Current symptoms consistent with acute bronchitis. Questionable allergic versus asthmatic component. Treating with prednisone, Tussionex, albuterol. Advise PFT's after she's better. Patient to contemplate.       Other Visit Diagnoses   None.     Meds ordered this encounter  Medications  . predniSONE (DELTASONE) 50 MG tablet    Sig: 1 tablet daily x 5 days.    Dispense:  5 tablet  Refill:  0  . chlorpheniramine-HYDROcodone (TUSSIONEX PENNKINETIC ER) 10-8 MG/5ML SUER    Sig: Take 5 mLs by mouth every 12 (twelve) hours as needed.    Dispense:  115 mL    Refill:  0  . albuterol (PROVENTIL HFA;VENTOLIN HFA) 108 (90 Base) MCG/ACT inhaler    Sig: Inhale 2 puffs into the lungs every 6 (six) hours as needed for wheezing or shortness of breath.    Dispense:  1 Inhaler    Refill:  0    Follow-up: PRN  [place]

## 2015-12-22 NOTE — Patient Instructions (Signed)
Take the medications as prescribed.  If you worsen, please let me know.  If you would like the testing after you're feeling well, let me know and I'll set it up.  Take care  Dr. Lacinda Axon

## 2016-04-16 ENCOUNTER — Encounter: Payer: Self-pay | Admitting: Family Medicine

## 2016-04-16 ENCOUNTER — Ambulatory Visit (INDEPENDENT_AMBULATORY_CARE_PROVIDER_SITE_OTHER): Payer: 59 | Admitting: Family Medicine

## 2016-04-16 DIAGNOSIS — J209 Acute bronchitis, unspecified: Secondary | ICD-10-CM

## 2016-04-16 DIAGNOSIS — I10 Essential (primary) hypertension: Secondary | ICD-10-CM | POA: Diagnosis not present

## 2016-04-16 DIAGNOSIS — J040 Acute laryngitis: Secondary | ICD-10-CM

## 2016-04-16 MED ORDER — PREDNISONE 50 MG PO TABS: ORAL_TABLET | ORAL | 0 refills | Status: DC

## 2016-04-16 MED ORDER — FLUTICASONE PROPIONATE 50 MCG/ACT NA SUSP: 2.0000 | Freq: Every day | NASAL | 6 refills | Status: DC

## 2016-04-16 NOTE — Assessment & Plan Note (Signed)
Uncontrolled based on readings today. Advise medication increase and patient refused. She will check her blood pressure daily at home and let me know if it is consistently elevated greater than 130/80.

## 2016-04-16 NOTE — Progress Notes (Signed)
Subjective:  Patient ID: Diamond Hill, female    DOB: [birth year]  Age: 66 y.o. MRN: [id number]  CC: Follow up; Hoarseness, Cough/rattling in chest  HPI:  66 year old female with hypertension presents for follow-up. She has additional concerns today. See below.  HTN  BP elevated today.  Patient states that her blood pressures are fairly well controlled at home.  She endorses compliance with amlodipine and losartan.  Hoarseness, Cough  This is been going on for the past 3 weeks.  Started with respiratory illness. Has continued to persist.  She endorses ongoing cough and postnasal drip.  She also reports a "rattling" in her chest.  No shortness of breath  She has been using albuterol.  No other medications or interventions tried.  She speaks a lot at work and is not able to rest her voice much.  Social Hx   Social History   Social History  . Marital status: Divorced    Spouse name: N/A  . Number of children: N/A  . Years of education: N/A   Social History Main Topics  . Smoking status: Never Smoker  . Smokeless tobacco: Never Used  . Alcohol use Yes  . Drug use: No  . Sexual activity: Not Asked   Other Topics Concern  . None   Social History Narrative   Lives in [place]. Separate from husband. 2 children.      Work Occupational psychologist at [employer].   Diet - Healthy   Exercise - none regular    Review of Systems  HENT: Positive for postnasal drip and voice change.   Respiratory: Positive for cough.    Objective:  BP (!) 164/99   Pulse 80   Temp 98.3 F (36.8 C) (Oral)   Wt 185 lb 12.8 oz (84.3 kg)   SpO2 98%   BMI 31.89 kg/m   BP/Weight 04/16/2016 0000000 [id number]  Systolic BP 123456 0000000 [id number]  Diastolic BP 99 94 84  Wt. (Lbs) 185.8 187.25 190.5  BMI 31.89 32.14 32.7   Physical Exam  Constitutional: She is oriented to person, place, and time. She appears well-developed. No distress.  HENT:  Mouth/Throat: Oropharynx is clear and moist.    Cardiovascular: Normal rate and regular rhythm.   Pulmonary/Chest: Effort normal.  Expiratory wheezing noted.  Neurological: She is alert and oriented to person, place, and time.  Psychiatric: She has a normal mood and affect.  Vitals reviewed.  Lab Results  Component Value Date   WBC 5.5 09/16/2015   HGB 13.7 09/16/2015   HCT 41.0 09/16/2015   PLT 245.0 09/16/2015   GLUCOSE 104 (H) 09/16/2015   CHOL 177 09/16/2015   TRIG 59.0 09/16/2015   HDL 85.20 09/16/2015   LDLDIRECT 92.7 09/06/2011   LDLCALC 80 09/16/2015   ALT 20 09/16/2015   AST 18 09/16/2015   NA 141 09/16/2015   K 3.8 09/16/2015   CL 103 09/16/2015   CREATININE 0.76 09/16/2015   BUN 13 09/16/2015   CO2 30 09/16/2015   TSH 2.49 09/16/2015   MICROALBUR <0.7 09/16/2015    Assessment & Plan:   Problem List Items Addressed This Visit    Hypertension    Uncontrolled based on readings today. Advise medication increase and patient refused. She will check her blood pressure daily at home and let me know if it is consistently elevated greater than 130/80.      Acute bronchitis    New acute illness. Treating with prednisone.      Laryngitis  New problem. Advised voice rest and Flonase. If persists, will need ENT evaluation.         Meds ordered this encounter  Medications  . predniSONE (DELTASONE) 50 MG tablet    Sig: 1 tablet daily x 5 days.    Dispense:  5 tablet    Refill:  0  . fluticasone (FLONASE) 50 MCG/ACT nasal spray    Sig: Place 2 sprays into both nostrils daily.    Dispense:  16 g    Refill:  6    Follow-up: 3 months  [place] DO [facility]

## 2016-04-16 NOTE — Assessment & Plan Note (Signed)
New problem. Advised voice rest and Flonase. If persists, will need ENT evaluation.

## 2016-04-16 NOTE — Patient Instructions (Signed)
Take the medications as prescribed.  If your hoarseness continues, let me know as you will need to see ENT.  BP goal 130/80. If it is persistently elevated greater than this, please let me know as a need to increase her blood pressure medications.  Follow-up in 3 months  Dr. Lacinda Axon

## 2016-04-16 NOTE — Assessment & Plan Note (Signed)
New acute illness. Treating with prednisone.

## 2016-04-16 NOTE — Progress Notes (Signed)
Pre visit review using our clinic review tool, if applicable. No additional management support is needed unless otherwise documented below in the visit note. 

## 2016-05-05 DIAGNOSIS — M9904 Segmental and somatic dysfunction of sacral region: Secondary | ICD-10-CM | POA: Diagnosis not present

## 2016-05-05 DIAGNOSIS — M5441 Lumbago with sciatica, right side: Secondary | ICD-10-CM | POA: Diagnosis not present

## 2016-05-05 DIAGNOSIS — M9903 Segmental and somatic dysfunction of lumbar region: Secondary | ICD-10-CM | POA: Diagnosis not present

## 2016-05-06 DIAGNOSIS — M9903 Segmental and somatic dysfunction of lumbar region: Secondary | ICD-10-CM | POA: Diagnosis not present

## 2016-05-06 DIAGNOSIS — M9904 Segmental and somatic dysfunction of sacral region: Secondary | ICD-10-CM | POA: Diagnosis not present

## 2016-05-06 DIAGNOSIS — M5441 Lumbago with sciatica, right side: Secondary | ICD-10-CM | POA: Diagnosis not present

## 2016-05-13 DIAGNOSIS — M9903 Segmental and somatic dysfunction of lumbar region: Secondary | ICD-10-CM | POA: Diagnosis not present

## 2016-05-13 DIAGNOSIS — M9904 Segmental and somatic dysfunction of sacral region: Secondary | ICD-10-CM | POA: Diagnosis not present

## 2016-05-13 DIAGNOSIS — M5441 Lumbago with sciatica, right side: Secondary | ICD-10-CM | POA: Diagnosis not present

## 2016-05-18 ENCOUNTER — Other Ambulatory Visit: Payer: Self-pay | Admitting: Family Medicine

## 2016-05-18 DIAGNOSIS — M9904 Segmental and somatic dysfunction of sacral region: Secondary | ICD-10-CM | POA: Diagnosis not present

## 2016-05-18 DIAGNOSIS — M9903 Segmental and somatic dysfunction of lumbar region: Secondary | ICD-10-CM | POA: Diagnosis not present

## 2016-05-18 DIAGNOSIS — M5441 Lumbago with sciatica, right side: Secondary | ICD-10-CM | POA: Diagnosis not present

## 2016-05-18 DIAGNOSIS — I10 Essential (primary) hypertension: Secondary | ICD-10-CM

## 2016-05-18 MED ORDER — AMLODIPINE BESYLATE 10 MG PO TABS: 10.0000 mg | ORAL_TABLET | Freq: Every day | ORAL | 3 refills | Status: DC

## 2016-05-18 MED ORDER — AMLODIPINE BESYLATE 10 MG PO TABS
10.0000 mg | ORAL_TABLET | Freq: Every day | ORAL | 3 refills | Status: DC
Start: 2016-05-18 — End: 2016-05-18

## 2016-05-20 DIAGNOSIS — M5441 Lumbago with sciatica, right side: Secondary | ICD-10-CM | POA: Diagnosis not present

## 2016-05-20 DIAGNOSIS — M9904 Segmental and somatic dysfunction of sacral region: Secondary | ICD-10-CM | POA: Diagnosis not present

## 2016-05-20 DIAGNOSIS — M9903 Segmental and somatic dysfunction of lumbar region: Secondary | ICD-10-CM | POA: Diagnosis not present

## 2016-09-15 ENCOUNTER — Other Ambulatory Visit: Payer: Self-pay

## 2016-09-15 MED ORDER — LOSARTAN POTASSIUM 50 MG PO TABS: 50.0000 mg | ORAL_TABLET | Freq: Every day | ORAL | 2 refills | Status: DC

## 2016-10-12 ENCOUNTER — Encounter: Payer: Self-pay | Admitting: Family Medicine

## 2016-10-12 ENCOUNTER — Ambulatory Visit (INDEPENDENT_AMBULATORY_CARE_PROVIDER_SITE_OTHER): Payer: 59 | Admitting: Family Medicine

## 2016-10-12 VITALS — BP 162/92 | HR 87 | Temp 98.2°F | Resp 16 | Ht 63.0 in | Wt 190.1 lb

## 2016-10-12 DIAGNOSIS — Z13220 Encounter for screening for lipoid disorders: Secondary | ICD-10-CM

## 2016-10-12 DIAGNOSIS — R739 Hyperglycemia, unspecified: Secondary | ICD-10-CM

## 2016-10-12 DIAGNOSIS — I10 Essential (primary) hypertension: Secondary | ICD-10-CM | POA: Diagnosis not present

## 2016-10-12 DIAGNOSIS — Z0001 Encounter for general adult medical examination with abnormal findings: Secondary | ICD-10-CM | POA: Diagnosis not present

## 2016-10-12 DIAGNOSIS — Z1239 Encounter for other screening for malignant neoplasm of breast: Secondary | ICD-10-CM

## 2016-10-12 DIAGNOSIS — Z130 Encounter for screening for diseases of the blood and blood-forming organs and certain disorders involving the immune mechanism: Secondary | ICD-10-CM | POA: Diagnosis not present

## 2016-10-12 DIAGNOSIS — Z1231 Encounter for screening mammogram for malignant neoplasm of breast: Secondary | ICD-10-CM | POA: Diagnosis not present

## 2016-10-12 LAB — LIPID PANEL
Cholesterol: 186 mg/dL (ref 0–200)
HDL: 94.2 mg/dL (ref >39.00)
LDL CALC: 82 mg/dL (ref 0–99)
NONHDL: 91.34
Total CHOL/HDL Ratio: 2
Triglycerides: 49 mg/dL (ref 0.0–149.0)
VLDL: 9.8 mg/dL (ref 0.0–40.0)

## 2016-10-12 LAB — COMPREHENSIVE METABOLIC PANEL
ALT: 15 U/L (ref 0–35)
AST: 15 U/L (ref 0–37)
Albumin: 4.3 g/dL (ref 3.5–5.2)
Alkaline Phosphatase: 49 U/L (ref 39–117)
BUN: 14 mg/dL (ref 6–23)
CHLORIDE: 104 meq/L (ref 96–112)
CO2: 30 meq/L (ref 19–32)
Calcium: 9.2 mg/dL (ref 8.4–10.5)
Creatinine, Ser: 0.85 mg/dL (ref 0.40–1.20)
GFR: 86.57 mL/min (ref >60.00)
GLUCOSE: 111 mg/dL — AB (ref 70–99)
POTASSIUM: 3.7 meq/L (ref 3.5–5.1)
SODIUM: 141 meq/L (ref 135–145)
TOTAL PROTEIN: 7.5 g/dL (ref 6.0–8.3)
Total Bilirubin: 0.5 mg/dL (ref 0.2–1.2)

## 2016-10-12 LAB — CBC
HCT: 42.9 % (ref 36.0–46.0)
HEMOGLOBIN: 14 g/dL (ref 12.0–15.0)
MCHC: 32.6 g/dL (ref 30.0–36.0)
MCV: 93.9 fl (ref 78.0–100.0)
PLATELETS: 294 10*3/uL (ref 150.0–400.0)
RBC: 4.57 Mil/uL (ref 3.87–5.11)
RDW: 13.9 % (ref 11.5–15.5)
WBC: 6.9 10*3/uL (ref 4.0–10.5)

## 2016-10-12 LAB — HEMOGLOBIN A1C: HEMOGLOBIN A1C: 6.4 % (ref 4.6–6.5)

## 2016-10-12 NOTE — Progress Notes (Signed)
Subjective:  Patient ID: Diamond Hill, female    DOB: 10-30-1950  Age: 66 y.o. MRN: [id number]  CC: Annual physical  HPI Diamond Hill is a 66 y.o. female presents to the clinic today for an annual exam.  Preventative Healthcare  Pap smear: Up to date.   Mammogram: In need of.  Colonoscopy: Up to date.   Immunizations: Up to date.   Labs: Labs today.  Exercise: No regular exercise.  Smoking/tobacco use: Never.  PMH, Surgical Hx, Family Hx, Social History reviewed and updated as below.  Past Medical History:  Diagnosis Date  . Blood in stool   . Cancer [facility]) 2005   rectal, Dr. Claybon Jabs, Dr. Mallie Snooks, Dr. Leamon Arnt, s/p XRT  . Colon polyp   . Diverticulitis   . Heart murmur   . Hypertension    Past Surgical History:  Procedure Laterality Date  . ABDOMINAL HYSTERECTOMY  1993   ovaries intact  . rectal cancer  2005   removal of lesion   Family History  Problem Relation Age of Onset  . Cancer Mother        colon  . Hyperlipidemia Mother   . Hypertension Mother   . Hyperlipidemia Father   . Hypertension Father   . Diabetes Father   . Heart disease Father   . Hypertension Other   . Breast cancer Neg Hx    Social History  Substance Use Topics  . Smoking status: Never Smoker  . Smokeless tobacco: Never Used  . Alcohol use Yes   Review of Systems General: Denies unexplained weight loss, fever. Skin: Denies new or changing mole, sore/wound that won't heal. ENT: Trouble hearing, ringing in the ears, sores in the mouth, hoarseness, trouble swallowing. Eyes: Denies trouble seeing/visual disturbance. Heart/CV: Denies chest pain, shortness of breath, edema, palpitations. Lungs/Resp: Denies cough, shortness of breath, hemoptysis. Abd/GI: Denies nausea, vomiting, diarrhea, constipation, abdominal pain, hematochezia, melena. GU: Denies dysuria, incontinence, hematuria, urinary frequency, difficulty starting/keeping stream, vaginal discharge, sexual difficulty, lump  in breasts. MSK: Denies joint pain/swelling, myalgias. Neuro: Denies headaches, weakness, numbness, dizziness, syncope. Psych: Denies sadness, anxiety, stress, memory difficulty. Endocrine: Denies polyuria and polydipsia.  Objective:   Today's Vitals: BP (!) 162/92 (BP Location: Left Arm, Patient Position: Sitting, Cuff Size: Large)   Pulse 87   Temp 98.2 F (36.8 C) (Oral)   Resp 16   Ht 5\' 3"  (1.6 m)   Wt 190 lb 2 oz (86.2 kg)   SpO2 99%   BMI 33.68 kg/m   Physical Exam  Constitutional: She is oriented to person, place, and time. She appears well-developed and well-nourished. No distress.  HENT:  Head: Normocephalic and atraumatic.  Nose: Nose normal.  Mouth/Throat: Oropharynx is clear and moist. No oropharyngeal exudate.  Normal TM's bilaterally.   Eyes: Conjunctivae are normal. No scleral icterus.  Neck: Neck supple.  Cardiovascular: Normal rate and regular rhythm.   No murmur heard. Pulmonary/Chest: Effort normal and breath sounds normal. She has no wheezes. She has no rales.  Abdominal: Soft. She exhibits no distension. There is no tenderness. There is no rebound and no guarding.  Musculoskeletal: Normal range of motion. She exhibits no edema.  Lymphadenopathy:    She has no cervical adenopathy.  Neurological: She is alert and oriented to person, place, and time.  Skin: Skin is warm and dry. No rash noted.  Psychiatric: She has a normal mood and affect.  Vitals reviewed.  Assessment & Plan:   Problem List Items Addressed This  Visit    Encounter for health maintenance examination with abnormal findings - Primary    BP elevated today. Patient wants to continue medications and make lifestyle changes. Screening labs today. Placing order for mammogram. Remainder of preventative health care up to date.       Hypertension   Relevant Orders   Comprehensive metabolic panel    Other Visit Diagnoses    Screening, lipid       Relevant Orders   Lipid panel   Blood  glucose elevated       Relevant Orders   Hemoglobin A1c   Screening for deficiency anemia       Relevant Orders   CBC   Breast cancer screening       Relevant Orders   MM Digital Screening     Follow-up: 6 months  [place] DO [facility]

## 2016-10-12 NOTE — Assessment & Plan Note (Signed)
BP elevated today. Patient wants to continue medications and make lifestyle changes. Screening labs today. Placing order for mammogram. Remainder of preventative health care up to date.

## 2016-10-12 NOTE — Patient Instructions (Signed)
Follow up in 6 months regarding your BP.  Take care  Dr. Lacinda Axon   Health Maintenance, Female Adopting a healthy lifestyle and getting preventive care can go a long way to promote health and wellness. Talk with your health care provider about what schedule of regular examinations is right for you. This is a good chance for you to check in with your provider about disease prevention and staying healthy. In between checkups, there are plenty of things you can do on your own. Experts have done a lot of research about which lifestyle changes and preventive measures are most likely to keep you healthy. Ask your health care provider for more information. Weight and diet Eat a healthy diet  Be sure to include plenty of vegetables, fruits, low-fat dairy products, and lean protein.  Do not eat a lot of foods high in solid fats, added sugars, or salt.  Get regular exercise. This is one of the most important things you can do for your health. ? Most adults should exercise for at least 150 minutes each week. The exercise should increase your heart rate and make you sweat (moderate-intensity exercise). ? Most adults should also do strengthening exercises at least twice a week. This is in addition to the moderate-intensity exercise.  Maintain a healthy weight  Body mass index (BMI) is a measurement that can be used to identify possible weight problems. It estimates body fat based on height and weight. Your health care provider can help determine your BMI and help you achieve or maintain a healthy weight.  For females 98 years of age and older: ? A BMI below 18.5 is considered underweight. ? A BMI of 18.5 to 24.9 is normal. ? A BMI of 25 to 29.9 is considered overweight. ? A BMI of 30 and above is considered obese.  Watch levels of cholesterol and blood lipids  You should start having your blood tested for lipids and cholesterol at 64 years of age, then have this test every 5 years.  You may need to  have your cholesterol levels checked more often if: ? Your lipid or cholesterol levels are high. ? You are older than 64 years of age. ? You are at high risk for heart disease.  Cancer screening Lung Cancer  Lung cancer screening is recommended for adults 75-5 years old who are at high risk for lung cancer because of a history of smoking.  A yearly low-dose CT scan of the lungs is recommended for people who: ? Currently smoke. ? Have quit within the past 15 years. ? Have at least a 30-pack-year history of smoking. A pack year is smoking an average of one pack of cigarettes a day for 1 year.  Yearly screening should continue until it has been 15 years since you quit.  Yearly screening should stop if you develop a health problem that would prevent you from having lung cancer treatment.  Breast Cancer  Practice breast self-awareness. This means understanding how your breasts normally appear and feel.  It also means doing regular breast self-exams. Let your health care provider know about any changes, no matter how small.  If you are in your 20s or 30s, you should have a clinical breast exam (CBE) by a health care provider every 1-3 years as part of a regular health exam.  If you are 45 or older, have a CBE every year. Also consider having a breast X-ray (mammogram) every year.  If you have a family history of breast cancer,  talk to your health care provider about genetic screening.  If you are at high risk for breast cancer, talk to your health care provider about having an MRI and a mammogram every year.  Breast cancer gene (BRCA) assessment is recommended for women who have family members with BRCA-related cancers. BRCA-related cancers include: ? Breast. ? Ovarian. ? Tubal. ? Peritoneal cancers.  Results of the assessment will determine the need for genetic counseling and BRCA1 and BRCA2 testing.  Cervical Cancer Your health care provider may recommend that you be screened  regularly for cancer of the pelvic organs (ovaries, uterus, and vagina). This screening involves a pelvic examination, including checking for microscopic changes to the surface of your cervix (Pap test). You may be encouraged to have this screening done every 3 years, beginning at age 27.  For women ages 48-65, health care providers may recommend pelvic exams and Pap testing every 3 years, or they may recommend the Pap and pelvic exam, combined with testing for human papilloma virus (HPV), every 5 years. Some types of HPV increase your risk of cervical cancer. Testing for HPV may also be done on women of any age with unclear Pap test results.  Other health care providers may not recommend any screening for nonpregnant women who are considered low risk for pelvic cancer and who do not have symptoms. Ask your health care provider if a screening pelvic exam is right for you.  If you have had past treatment for cervical cancer or a condition that could lead to cancer, you need Pap tests and screening for cancer for at least 20 years after your treatment. If Pap tests have been discontinued, your risk factors (such as having a new sexual partner) need to be reassessed to determine if screening should resume. Some women have medical problems that increase the chance of getting cervical cancer. In these cases, your health care provider may recommend more frequent screening and Pap tests.  Colorectal Cancer  This type of cancer can be detected and often prevented.  Routine colorectal cancer screening usually begins at 64 years of age and continues through 64 years of age.  Your health care provider may recommend screening at an earlier age if you have risk factors for colon cancer.  Your health care provider may also recommend using home test kits to check for hidden blood in the stool.  A small camera at the end of a tube can be used to examine your colon directly (sigmoidoscopy or colonoscopy). This is  done to check for the earliest forms of colorectal cancer.  Routine screening usually begins at age 74.  Direct examination of the colon should be repeated every 5-10 years through 64 years of age. However, you may need to be screened more often if early forms of precancerous polyps or small growths are found.  Skin Cancer  Check your skin from head to toe regularly.  Tell your health care provider about any new moles or changes in moles, especially if there is a change in a mole's shape or color.  Also tell your health care provider if you have a mole that is larger than the size of a pencil eraser.  Always use sunscreen. Apply sunscreen liberally and repeatedly throughout the day.  Protect yourself by wearing long sleeves, pants, a wide-brimmed hat, and sunglasses whenever you are outside.  Heart disease, diabetes, and high blood pressure  High blood pressure causes heart disease and increases the risk of stroke. High blood pressure is  more likely to develop in: ? People who have blood pressure in the high end of the normal range (130-139/85-89 mm Hg). ? People who are overweight or obese. ? People who are African American.  If you are 90-6 years of age, have your blood pressure checked every 3-5 years. If you are 13 years of age or older, have your blood pressure checked every year. You should have your blood pressure measured twice-once when you are at a hospital or clinic, and once when you are not at a hospital or clinic. Record the average of the two measurements. To check your blood pressure when you are not at a hospital or clinic, you can use: ? An automated blood pressure machine at a pharmacy. ? A home blood pressure monitor.  If you are between 68 years and 32 years old, ask your health care provider if you should take aspirin to prevent strokes.  Have regular diabetes screenings. This involves taking a blood sample to check your fasting blood sugar level. ? If you are  at a normal weight and have a low risk for diabetes, have this test once every three years after 64 years of age. ? If you are overweight and have a high risk for diabetes, consider being tested at a younger age or more often. Preventing infection Hepatitis B  If you have a higher risk for hepatitis B, you should be screened for this virus. You are considered at high risk for hepatitis B if: ? You were born in a country where hepatitis B is common. Ask your health care provider which countries are considered high risk. ? Your parents were born in a high-risk country, and you have not been immunized against hepatitis B (hepatitis B vaccine). ? You have HIV or AIDS. ? You use needles to inject street drugs. ? You live with someone who has hepatitis B. ? You have had sex with someone who has hepatitis B. ? You get hemodialysis treatment. ? You take certain medicines for conditions, including cancer, organ transplantation, and autoimmune conditions.  Hepatitis C  Blood testing is recommended for: ? Everyone born from [birth year] through 1963. ? Anyone with known risk factors for hepatitis C.  Sexually transmitted infections (STIs)  You should be screened for sexually transmitted infections (STIs) including gonorrhea and chlamydia if: ? You are sexually active and are younger than 64 years of age. ? You are older than 64 years of age and your health care provider tells you that you are at risk for this type of infection. ? Your sexual activity has changed since you were last screened and you are at an increased risk for chlamydia or gonorrhea. Ask your health care provider if you are at risk.  If you do not have HIV, but are at risk, it may be recommended that you take a prescription medicine daily to prevent HIV infection. This is called pre-exposure prophylaxis (PrEP). You are considered at risk if: ? You are sexually active and do not regularly use condoms or know the HIV status of your  partner(s). ? You take drugs by injection. ? You are sexually active with a partner who has HIV.  Talk with your health care provider about whether you are at high risk of being infected with HIV. If you choose to begin PrEP, you should first be tested for HIV. You should then be tested every 3 months for as long as you are taking PrEP. Pregnancy  If you are premenopausal and you  may become pregnant, ask your health care provider about preconception counseling.  If you may become pregnant, take 400 to 800 micrograms (mcg) of folic acid every day.  If you want to prevent pregnancy, talk to your health care provider about birth control (contraception). Osteoporosis and menopause  Osteoporosis is a disease in which the bones lose minerals and strength with aging. This can result in serious bone fractures. Your risk for osteoporosis can be identified using a bone density scan.  If you are 59 years of age or older, or if you are at risk for osteoporosis and fractures, ask your health care provider if you should be screened.  Ask your health care provider whether you should take a calcium or vitamin D supplement to lower your risk for osteoporosis.  Menopause may have certain physical symptoms and risks.  Hormone replacement therapy may reduce some of these symptoms and risks. Talk to your health care provider about whether hormone replacement therapy is right for you. Follow these instructions at home:  Schedule regular health, dental, and eye exams.  Stay current with your immunizations.  Do not use any tobacco products including cigarettes, chewing tobacco, or electronic cigarettes.  If you are pregnant, do not drink alcohol.  If you are breastfeeding, limit how much and how often you drink alcohol.  Limit alcohol intake to no more than 1 drink per day for nonpregnant women. One drink equals 12 ounces of beer, 5 ounces of wine, or 1 ounces of hard liquor.  Do not use street  drugs.  Do not share needles.  Ask your health care provider for help if you need support or information about quitting drugs.  Tell your health care provider if you often feel depressed.  Tell your health care provider if you have ever been abused or do not feel safe at home. This information is not intended to replace advice given to you by your health care provider. Make sure you discuss any questions you have with your health care provider. Document Released: 09/07/2010 Document Revised: 07/31/2015 Document Reviewed: 11/26/2014 Elsevier Interactive Patient Education  [employer].

## 2016-11-26 ENCOUNTER — Encounter: Payer: Self-pay | Admitting: Family Medicine

## 2016-12-14 ENCOUNTER — Ambulatory Visit
Admission: RE | Admit: 2016-12-14 | Discharge: 2016-12-14 | Disposition: A | Discharge status: Home / Self Care | Payer: 59 | Source: Ambulatory Visit | Attending: Family Medicine | Admitting: Family Medicine

## 2016-12-14 DIAGNOSIS — Z1231 Encounter for screening mammogram for malignant neoplasm of breast: Secondary | ICD-10-CM | POA: Insufficient documentation

## 2017-04-14 ENCOUNTER — Ambulatory Visit: Payer: 59 | Admitting: Family

## 2017-07-20 ENCOUNTER — Encounter: Payer: Self-pay | Admitting: Family

## 2017-07-20 ENCOUNTER — Ambulatory Visit (INDEPENDENT_AMBULATORY_CARE_PROVIDER_SITE_OTHER): Payer: 59 | Admitting: Family

## 2017-07-20 VITALS — BP 140/86 | HR 82 | Temp 98.6°F | Resp 16 | Wt 189.0 lb

## 2017-07-20 DIAGNOSIS — F419 Anxiety disorder, unspecified: Secondary | ICD-10-CM

## 2017-07-20 DIAGNOSIS — I10 Essential (primary) hypertension: Secondary | ICD-10-CM | POA: Diagnosis not present

## 2017-07-20 MED ORDER — ESCITALOPRAM OXALATE 10 MG PO TABS
10.0000 mg | ORAL_TABLET | Freq: Every day | ORAL | 0 refills | Status: DC
Start: 2017-07-20 — End: 2018-01-16

## 2017-07-20 NOTE — Assessment & Plan Note (Signed)
Elevated today. NO CP.  Suspect that poor sleep, anxiety may be contributory.  Patient will keep blood pressure log at home and we will adjust medications from there.

## 2017-07-20 NOTE — Patient Instructions (Addendum)
Please return for annual physical  We will do fasting labs then  Let me know how you are doing on the lexapro.    Monitor blood pressure,  Goal is less than 130/80; if persistently higher, please make sooner follow up appointment so we can recheck you blood pressure and manage medications   Please send me message of blood pressures this week.

## 2017-07-20 NOTE — Progress Notes (Signed)
Subjective:    Patient ID: Diamond Hill, female    DOB: [birth year], 67 y.o.   MRN: [id number]  CC: Diamond Hill is a 67 y.o. female who presents today for follow up.   HPI: HTN- Compliant with medication. Losartan has not been recalled per patient; she asked pharmacist. Not checking blood pressure at home. Has BP machine at home. Denies exertional chest pain or pressure, numbness or tingling radiating to left arm or jaw, palpitations, dizziness, frequent headaches, changes in vision, or shortness of breath.  Not a smoker.   Complains of trouble falling asleep. Mind is racing. Work is stressful. More anxious. Thinks if can sleep better, blood pressure will improve. No si/hi. Had been on prozac in the past, lexapro  Prediabetic -eating more salads now. Not exercising now.    HISTORY:  Past Medical History:  Diagnosis Date  . Blood in stool   . Cancer [facility]) 2005   rectal, Dr. Claybon Jabs, Dr. Mallie Snooks, Dr. Leamon Arnt, s/p XRT  . Colon polyp   . Diverticulitis   . Heart murmur   . Hypertension    Past Surgical History:  Procedure Laterality Date  . ABDOMINAL HYSTERECTOMY  1993   ovaries intact  . rectal cancer  2005   removal of lesion   Family History  Problem Relation Age of Onset  . Cancer Mother        colon  . Hyperlipidemia Mother   . Hypertension Mother   . Hyperlipidemia Father   . Hypertension Father   . Diabetes Father   . Heart disease Father   . Hypertension Other   . Breast cancer Neg Hx     Allergies: Patient has no known allergies. Current Outpatient Medications on File Prior to Visit  Medication Sig Dispense Refill  . albuterol (PROVENTIL HFA;VENTOLIN HFA) 108 (90 Base) MCG/ACT inhaler Inhale 2 puffs into the lungs every 6 (six) hours as needed for wheezing or shortness of breath. 1 Inhaler 0  . amLODipine (NORVASC) 10 MG tablet Take 1 tablet (10 mg total) by mouth daily. 90 tablet 3  . b complex vitamins tablet Take 1 tablet by mouth daily.    .  fluticasone (FLONASE) 50 MCG/ACT nasal spray Place 2 sprays into both nostrils daily. 16 g 6  . losartan (COZAAR) 50 MG tablet Take 1 tablet (50 mg total) by mouth daily. 90 tablet 2  . Multiple Vitamin (MULTIVITAMIN) tablet Take 1 tablet by mouth daily.     No current facility-administered medications on file prior to visit.     Social History   Tobacco Use  . Smoking status: Never Smoker  . Smokeless tobacco: Never Used  Substance Use Topics  . Alcohol use: Yes  . Drug use: No    Review of Systems  Constitutional: Negative for chills and fever.  Respiratory: Negative for cough.   Cardiovascular: Negative for chest pain and palpitations.  Gastrointestinal: Negative for nausea and vomiting.      Objective:    BP 140/86   Pulse 82   Temp 98.6 F (37 C) (Oral)   Resp 16   Wt 189 lb (85.7 kg)   SpO2 96%   BMI 33.48 kg/m  BP Readings from Last 3 Encounters:  07/20/17 140/86  10/12/16 (!) 162/92  04/16/16 (!) 164/99   Wt Readings from Last 3 Encounters:  07/20/17 189 lb (85.7 kg)  10/12/16 190 lb 2 oz (86.2 kg)  04/16/16 185 lb 12.8 oz (84.3 kg)    Physical  Exam  Constitutional: She appears well-developed and well-nourished.  Eyes: Conjunctivae are normal.  Cardiovascular: Normal rate, regular rhythm, normal heart sounds and normal pulses.  Pulmonary/Chest: Effort normal and breath sounds normal. She has no wheezes. She has no rhonchi. She has no rales.  Neurological: She is alert.  Skin: Skin is warm and dry.  Psychiatric: She has a normal mood and affect. Her speech is normal and behavior is normal. Thought content normal.  Vitals reviewed.      Assessment & Plan:   Problem List Items Addressed This Visit      Cardiovascular and Mediastinum   Hypertension - Primary    Elevated today. NO CP.  Suspect that poor sleep, anxiety may be contributory.  Patient will keep blood pressure log at home and we will adjust medications from there.        Other    Anxiety    Increased of late, primarily from work. Affecting sleep.   She was on Lexapro in the past and did well.  Will restart this medication again today.  Close follow-up and OV recommended in 4 to 6 weeks.      Relevant Medications   escitalopram (LEXAPRO) 10 MG tablet       I am having Diamond Hill start on escitalopram. I am also having her maintain her b complex vitamins, multivitamin, albuterol, fluticasone, amLODipine, and losartan.   Meds ordered this encounter  Medications  . escitalopram (LEXAPRO) 10 MG tablet    Sig: Take 1 tablet (10 mg total) by mouth daily.    Dispense:  90 tablet    Refill:  0    Order Specific Question:   Supervising Provider    Answer:   Crecencio Mc [[id number]]    Return precautions given.   Risks, benefits, and alternatives of the medications and treatment plan prescribed today were discussed, and patient expressed understanding.   Education regarding symptom management and diagnosis given to patient on AVS.  Continue to follow with Coral Spikes, DO for routine health maintenance.   Diamond Hill and I agreed with plan.   [name], FNP

## 2017-07-20 NOTE — Assessment & Plan Note (Addendum)
Increased of late, primarily from work. Affecting sleep.   She was on Lexapro in the past and did well.  Will restart this medication again today.  Close follow-up and OV recommended in 4 to 6 weeks.

## 2017-08-24 ENCOUNTER — Other Ambulatory Visit: Payer: Self-pay | Admitting: Family Medicine

## 2017-08-24 DIAGNOSIS — I10 Essential (primary) hypertension: Secondary | ICD-10-CM

## 2017-08-25 NOTE — Telephone Encounter (Signed)
rx refill, she saw Joycelyn Schmid in May and has a follow up with her on 08-31-17 but these were filled by Dr. Lacinda Axon. Please advise

## 2017-08-31 ENCOUNTER — Encounter: Payer: Self-pay | Admitting: Family

## 2017-08-31 ENCOUNTER — Ambulatory Visit (INDEPENDENT_AMBULATORY_CARE_PROVIDER_SITE_OTHER): Payer: 59 | Admitting: Family

## 2017-08-31 VITALS — BP 150/90 | HR 71 | Temp 98.5°F | Wt 187.5 lb

## 2017-08-31 DIAGNOSIS — J309 Allergic rhinitis, unspecified: Secondary | ICD-10-CM | POA: Diagnosis not present

## 2017-08-31 DIAGNOSIS — Z136 Encounter for screening for cardiovascular disorders: Secondary | ICD-10-CM | POA: Diagnosis not present

## 2017-08-31 DIAGNOSIS — I10 Essential (primary) hypertension: Secondary | ICD-10-CM

## 2017-08-31 DIAGNOSIS — R7303 Prediabetes: Secondary | ICD-10-CM

## 2017-08-31 DIAGNOSIS — Z13220 Encounter for screening for lipoid disorders: Secondary | ICD-10-CM | POA: Diagnosis not present

## 2017-08-31 DIAGNOSIS — Z8639 Personal history of other endocrine, nutritional and metabolic disease: Secondary | ICD-10-CM | POA: Insufficient documentation

## 2017-08-31 DIAGNOSIS — F419 Anxiety disorder, unspecified: Secondary | ICD-10-CM | POA: Diagnosis not present

## 2017-08-31 MED ORDER — AMLODIPINE-OLMESARTAN 10-40 MG PO TABS: 1.0000 | ORAL_TABLET | Freq: Every day | ORAL | 1 refills | Status: DC

## 2017-08-31 MED ORDER — FLUTICASONE PROPIONATE 50 MCG/ACT NA SUSP: 2.0000 | Freq: Every day | NASAL | 6 refills | Status: DC

## 2017-08-31 NOTE — Assessment & Plan Note (Signed)
Slightly elevated today.  Patient states better control with a combination pill.  We will go ahead make this change.  She will recheck BMP in 5 to 7 days.  She will keep blood pressure log at home and let me know if not at goal

## 2017-08-31 NOTE — Progress Notes (Signed)
Subjective:    Patient ID: Diamond Hill, female    DOB: 1950/04/29, 67 y.o.   MRN: [id number]  CC: Diamond Hill is a 67 y.o. female who presents today for follow up.   HPI: HTN- losartan pill color has changed due to recall. Would like to go back to [place] as had better control then. Not checking blood pressure at home.  Denies exertional chest pain or pressure, numbness or tingling radiating to left arm or jaw, palpitations, dizziness, frequent headaches, changes in vision, or shortness of breath.   GAD- hasnt started lexapro; thought it could . No si/hi.  History for vitamin D- has been on supplement.     HISTORY:  Past Medical History:  Diagnosis Date  . Blood in stool   . Cancer [facility]) 2005   rectal, Dr. Claybon Jabs, Dr. Mallie Snooks, Dr. Leamon Arnt, s/p XRT  . Colon polyp   . Diverticulitis   . Heart murmur   . Hypertension    Past Surgical History:  Procedure Laterality Date  . ABDOMINAL HYSTERECTOMY  1993   ovaries intact  . rectal cancer  2005   removal of lesion   Family History  Problem Relation Age of Onset  . Cancer Mother        colon  . Hyperlipidemia Mother   . Hypertension Mother   . Hyperlipidemia Father   . Hypertension Father   . Diabetes Father   . Heart disease Father   . Hypertension Other   . Breast cancer Neg Hx     Allergies: Patient has no known allergies. Current Outpatient Medications on File Prior to Visit  Medication Sig Dispense Refill  . albuterol (PROVENTIL HFA;VENTOLIN HFA) 108 (90 Base) MCG/ACT inhaler Inhale 2 puffs into the lungs every 6 (six) hours as needed for wheezing or shortness of breath. 1 Inhaler 0  . b complex vitamins tablet Take 1 tablet by mouth daily.    Marland Kitchen escitalopram (LEXAPRO) 10 MG tablet Take 1 tablet (10 mg total) by mouth daily. 90 tablet 0  . Multiple Vitamin (MULTIVITAMIN) tablet Take 1 tablet by mouth daily.     No current facility-administered medications on file prior to visit.     Social History    Tobacco Use  . Smoking status: Never Smoker  . Smokeless tobacco: Never Used  Substance Use Topics  . Alcohol use: Yes  . Drug use: No    Review of Systems  Constitutional: Negative for chills and fever.  Respiratory: Negative for cough.   Cardiovascular: Negative for chest pain and palpitations.  Gastrointestinal: Negative for nausea and vomiting.  Psychiatric/Behavioral: Negative for suicidal ideas. The patient is nervous/anxious.       Objective:    BP (!) 150/90 (BP Location: Left Arm, Patient Position: Sitting, Cuff Size: Large)   Pulse 71   Temp 98.5 F (36.9 C) (Oral)   Wt 187 lb 8 oz (85 kg)   SpO2 98%   BMI 33.21 kg/m  BP Readings from Last 3 Encounters:  08/31/17 (!) 150/90  07/20/17 140/86  10/12/16 (!) 162/92   Wt Readings from Last 3 Encounters:  08/31/17 187 lb 8 oz (85 kg)  07/20/17 189 lb (85.7 kg)  10/12/16 190 lb 2 oz (86.2 kg)    Physical Exam  Constitutional: She appears well-developed and well-nourished.  Eyes: Conjunctivae are normal.  Cardiovascular: Normal rate, regular rhythm, normal heart sounds and normal pulses.  Pulmonary/Chest: Effort normal and breath sounds normal. She has no wheezes. She has  no rhonchi. She has no rales.  Neurological: She is alert.  Skin: Skin is warm and dry.  Psychiatric: She has a normal mood and affect. Her speech is normal and behavior is normal. Thought content normal.  Vitals reviewed.      Assessment & Plan:   Problem List Items Addressed This Visit      Cardiovascular and Mediastinum   Hypertension - Primary    Slightly elevated today.  Patient states better control with a combination pill.  We will go ahead make this change.  She will recheck BMP in 5 to 7 days.  She will keep blood pressure log at home and let me know if not at goal      Relevant Medications   amLODipine-olmesartan (AZOR) 10-40 MG tablet   Other Relevant Orders   Comprehensive metabolic panel     Respiratory   Allergic  rhinitis   Relevant Medications   fluticasone (FLONASE) 50 MCG/ACT nasal spray     Other   Anxiety    Unchanged.  Hasnt started Lexapro yet.  Patient states that she will do so this week.  Patient will know how she is doing on medication.      History of vitamin D deficiency    Pending lab      Relevant Orders   VITAMIN D 25 Hydroxy (Vit-D Deficiency, Fractures)   Encounter for lipid screening for cardiovascular disease    Pending lipid panel      Relevant Orders   Hemoglobin A1c   Lipid panel   Prediabetes    Pending lab.       Relevant Orders   Hemoglobin A1c       I have discontinued Kamyra W. Juhnke's amLODipine and losartan. I am also having her start on amLODipine-olmesartan. Additionally, I am having her maintain her b complex vitamins, multivitamin, albuterol, escitalopram, and fluticasone.   Meds ordered this encounter  Medications  . fluticasone (FLONASE) 50 MCG/ACT nasal spray    Sig: Place 2 sprays into both nostrils daily.    Dispense:  16 g    Refill:  6  . amLODipine-olmesartan (AZOR) 10-40 MG tablet    Sig: Take 1 tablet by mouth daily.    Dispense:  90 tablet    Refill:  1    Order Specific Question:   Supervising Provider    Answer:   Crecencio Mc [[id number]]    Return precautions given.   Risks, benefits, and alternatives of the medications and treatment plan prescribed today were discussed, and patient expressed understanding.   Education regarding symptom management and diagnosis given to patient on AVS.  Continue to follow with Coral Spikes, DO for routine health maintenance.   Diamond Hill and I agreed with plan.   [name], FNP

## 2017-08-31 NOTE — Assessment & Plan Note (Signed)
Pending lipid panel 

## 2017-08-31 NOTE — Assessment & Plan Note (Signed)
Pending lab 

## 2017-08-31 NOTE — Assessment & Plan Note (Signed)
Unchanged.  Hasnt started Lexapro yet.  Patient states that she will do so this week.  Patient will know how she is doing on medication.

## 2017-08-31 NOTE — Patient Instructions (Signed)
Start Azor again  Labs on Monday - these are fasting. Make an appt at check out.     Monitor blood pressure,  Goal is less than 130/80; if persistently higher, please make sooner follow up appointment so we can recheck you blood pressure and manage medications

## 2017-09-05 ENCOUNTER — Telehealth: Payer: Self-pay

## 2017-09-05 ENCOUNTER — Other Ambulatory Visit (INDEPENDENT_AMBULATORY_CARE_PROVIDER_SITE_OTHER): Payer: 59

## 2017-09-05 DIAGNOSIS — R7303 Prediabetes: Secondary | ICD-10-CM | POA: Diagnosis not present

## 2017-09-05 DIAGNOSIS — I10 Essential (primary) hypertension: Secondary | ICD-10-CM

## 2017-09-05 DIAGNOSIS — Z13220 Encounter for screening for lipoid disorders: Secondary | ICD-10-CM

## 2017-09-05 DIAGNOSIS — Z136 Encounter for screening for cardiovascular disorders: Secondary | ICD-10-CM

## 2017-09-05 DIAGNOSIS — Z8639 Personal history of other endocrine, nutritional and metabolic disease: Secondary | ICD-10-CM

## 2017-09-05 LAB — LIPID PANEL
CHOL/HDL RATIO: 2
Cholesterol: 202 mg/dL — ABNORMAL HIGH (ref 0–200)
HDL: 93.7 mg/dL (ref >39.00)
LDL CALC: 97 mg/dL (ref 0–99)
NONHDL: 108.21
Triglycerides: 57 mg/dL (ref 0.0–149.0)
VLDL: 11.4 mg/dL (ref 0.0–40.0)

## 2017-09-05 LAB — VITAMIN D 25 HYDROXY (VIT D DEFICIENCY, FRACTURES): VITD: 30.49 ng/mL (ref 30.00–100.00)

## 2017-09-05 LAB — COMPREHENSIVE METABOLIC PANEL
ALBUMIN: 4.4 g/dL (ref 3.5–5.2)
ALT: 17 U/L (ref 0–35)
AST: 15 U/L (ref 0–37)
Alkaline Phosphatase: 59 U/L (ref 39–117)
BUN: 16 mg/dL (ref 6–23)
CALCIUM: 9.4 mg/dL (ref 8.4–10.5)
CHLORIDE: 105 meq/L (ref 96–112)
CO2: 28 meq/L (ref 19–32)
CREATININE: 0.87 mg/dL (ref 0.40–1.20)
GFR: 84.04 mL/min (ref >60.00)
Glucose, Bld: 106 mg/dL — ABNORMAL HIGH (ref 70–99)
POTASSIUM: 3.8 meq/L (ref 3.5–5.1)
Sodium: 143 mEq/L (ref 135–145)
Total Bilirubin: 0.4 mg/dL (ref 0.2–1.2)
Total Protein: 7.4 g/dL (ref 6.0–8.3)

## 2017-09-05 LAB — HEMOGLOBIN A1C: Hgb A1c MFr Bld: 6.5 % (ref 4.6–6.5)

## 2017-09-05 NOTE — Telephone Encounter (Signed)
Per RX benefit plan doesn't cover medication prescribed.   Per Rx benefit plan alternative medications nclude amlodipine/valsartan (generic Exforge)

## 2017-09-05 NOTE — Telephone Encounter (Signed)
Spoke with patient and she has blood pressure until we complete prior authorization.

## 2017-09-05 NOTE — Telephone Encounter (Signed)
Call pt  Does she have prior medications while we await approval?  She had been on losartan 50mg  qd and amlodipine 10mg  qd - please send in if patient doesn't have anything and BP are high

## 2017-09-09 NOTE — Telephone Encounter (Signed)
Prior authorization submitted.

## 2017-09-21 NOTE — Telephone Encounter (Signed)
Prior authorization denial placed on your desk .  Patient has to try amlodipine in combination with olmesartan.   Amlodipine/valsartan.

## 2017-09-30 NOTE — Telephone Encounter (Signed)
Circle back with patient  How is BP on current regimen norvasc and losartan?  Is she adamant about having combo pill ?  If so , we can try amlodipine 5mg  Jasmine Awe 160mg  ( Exforge) with close vigilance to ensure this is NOT too potent OR too weak.  We would need close f/u for this and also another BMP.   Please let me know what she prefers to do.

## 2017-09-30 NOTE — Addendum Note (Signed)
Addended by: Burnard Hawthorne on: 09/30/2017 02:25 PM   Modules accepted: Orders

## 2017-09-30 NOTE — Telephone Encounter (Signed)
Spoke with patient she will just stay on norvasc and losartan for now just got refill on medications. Blood pressure running 137/83 and 138/87

## 2017-10-03 NOTE — Telephone Encounter (Signed)
Call pt  Overall pleased with blood pressure.  If we do not get to consistently less than 130/80, let me know, we may need to 100mg  ( from 50mg ).   Advise her to follow  Also ensure she is not taking NSAIDs as they can increase BP.   Exercise, weight loss will also help lower blood pressure

## 2017-10-04 ENCOUNTER — Telehealth: Payer: Self-pay | Admitting: *Deleted

## 2017-10-04 DIAGNOSIS — J309 Allergic rhinitis, unspecified: Secondary | ICD-10-CM

## 2017-10-04 NOTE — Telephone Encounter (Signed)
Left voice mail for patient to call back ok for PEC to speak to patient    

## 2017-10-04 NOTE — Telephone Encounter (Signed)
Message from M. Arnett read to patient; verbalizes understanding. Pt also would like refill on inhaler; states she was asked at appt but declined. Decided she would like to have refill.

## 2017-10-04 NOTE — Telephone Encounter (Signed)
FYI

## 2017-10-11 MED ORDER — ALBUTEROL SULFATE HFA 108 (90 BASE) MCG/ACT IN AERS
2.0000 | INHALATION_SPRAY | Freq: Four times a day (QID) | RESPIRATORY_TRACT | 1 refills | Status: DC | PRN
Start: 2017-10-11 — End: 2017-12-02

## 2017-10-11 NOTE — Telephone Encounter (Signed)
Patient is requesting refill on albuterol inhaler . Dr Lacinda Axon previously prescribed 12/22/15

## 2017-10-11 NOTE — Telephone Encounter (Signed)
Call pt I refilled however not sure if she h/o asthma or lung disease?  Does she have a cold?  Please advise her to make an appt if symptoms persist

## 2017-10-11 NOTE — Telephone Encounter (Signed)
Patient advised of result and verbalized an understanding.  

## 2017-10-11 NOTE — Addendum Note (Signed)
Addended by: Burnard Hawthorne on: 10/11/2017 10:04 PM   Modules accepted: Orders

## 2017-10-12 NOTE — Telephone Encounter (Signed)
Left voice mail for patient to call back ok for PEC to speak to patient    

## 2017-10-12 NOTE — Telephone Encounter (Signed)
Treid to reach patient and advise her medication has been filled and sent to [facility] nurse may advise patient

## 2017-10-12 NOTE — Telephone Encounter (Signed)
Patient states with the change in the weather- she has allergy symptoms- she has cough and phlegm.The Inhaler seems to help her symptoms and helps her breathing clear up. She states she does not use it a lot- just when she feels she need to open her lungs up more. She states she does have an appointment in September- but if she needs to come in before that for the inhaler - she will.

## 2017-10-18 NOTE — Telephone Encounter (Signed)
Pt is aware inhaler sent in

## 2017-10-18 NOTE — Telephone Encounter (Signed)
Left voice mail for patient to call back ok for PEC to speak to patient , inhaler sent in.

## 2017-10-20 IMAGING — MG MM DIGITAL SCREENING BILAT W/ CAD
4 series · 4 of 4 positions shown · non-contrast
Comparison: Previous exam(s).

ACR Breast Density Category a: The breast tissue is almost entirely
fatty.

CLINICAL DATA: Screening.

EXAM:
DIGITAL SCREENING BILATERAL MAMMOGRAM WITH CAD

[R MLO]
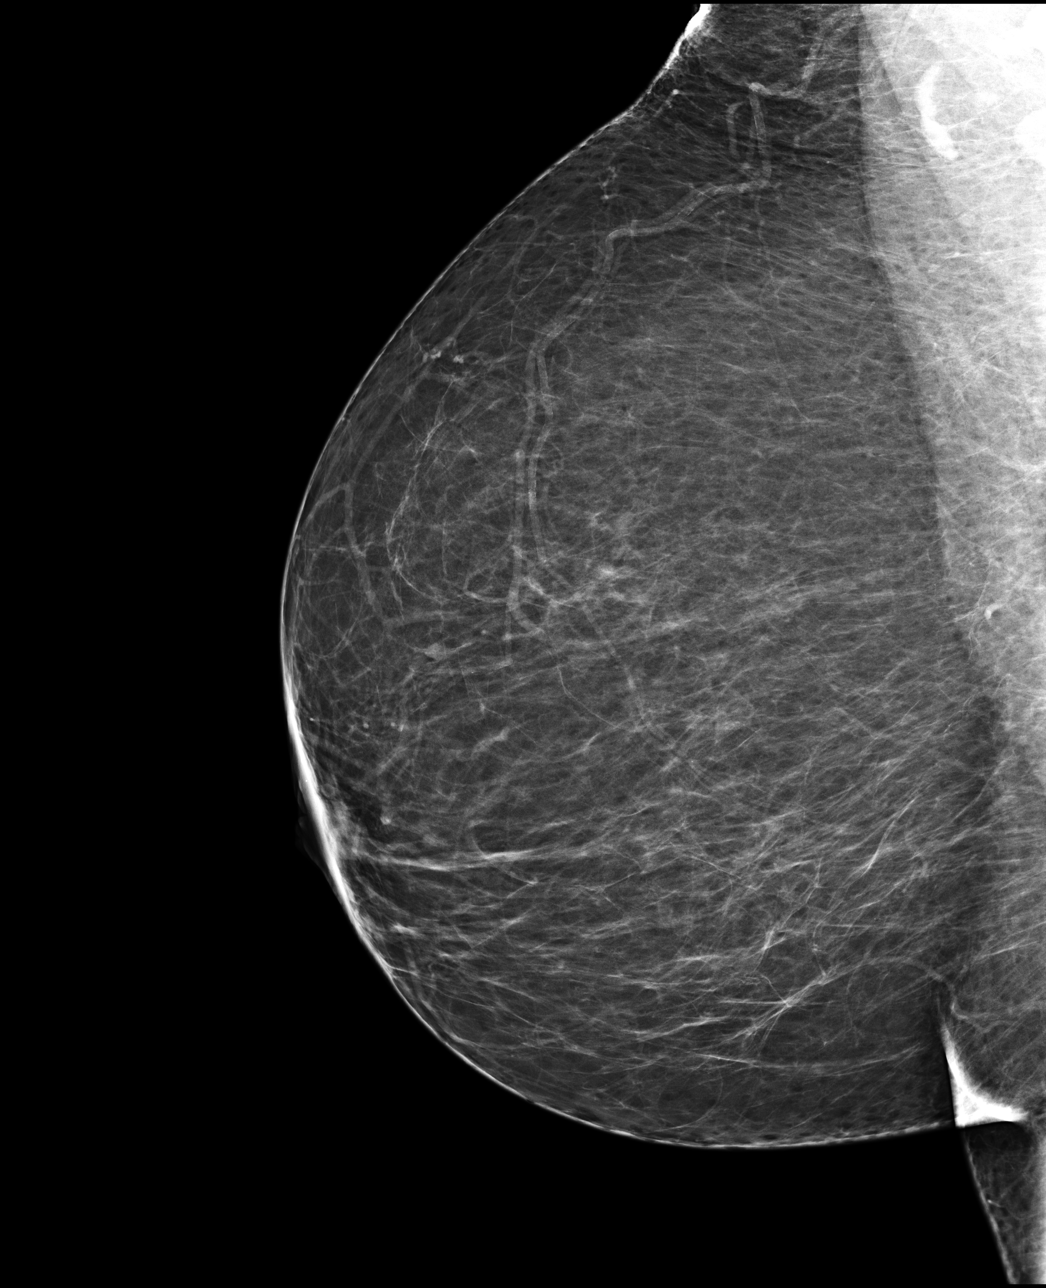

[L CC]
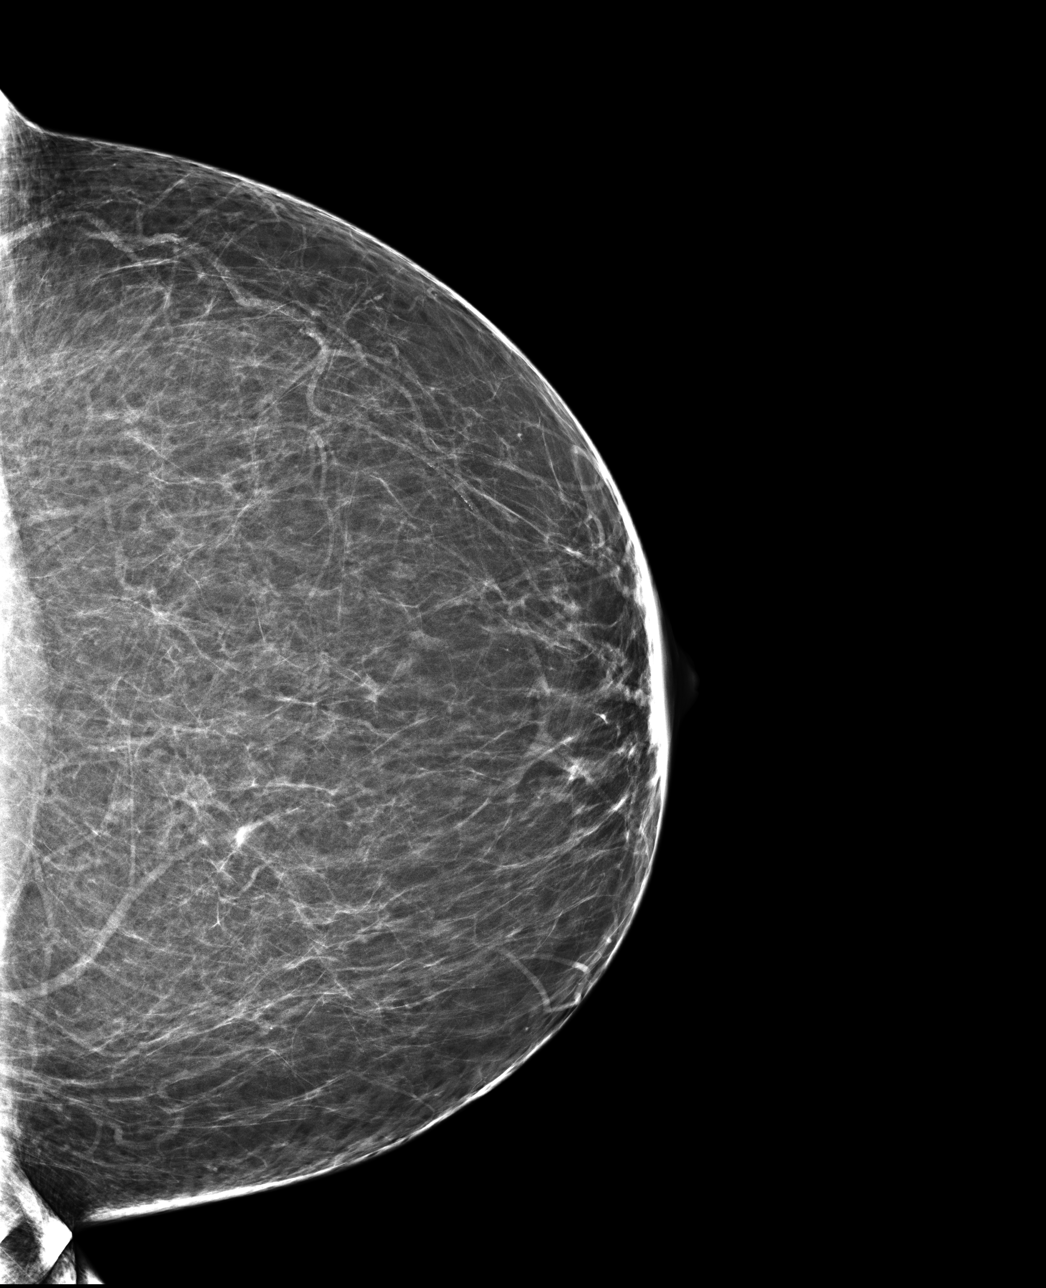

[L MLO]
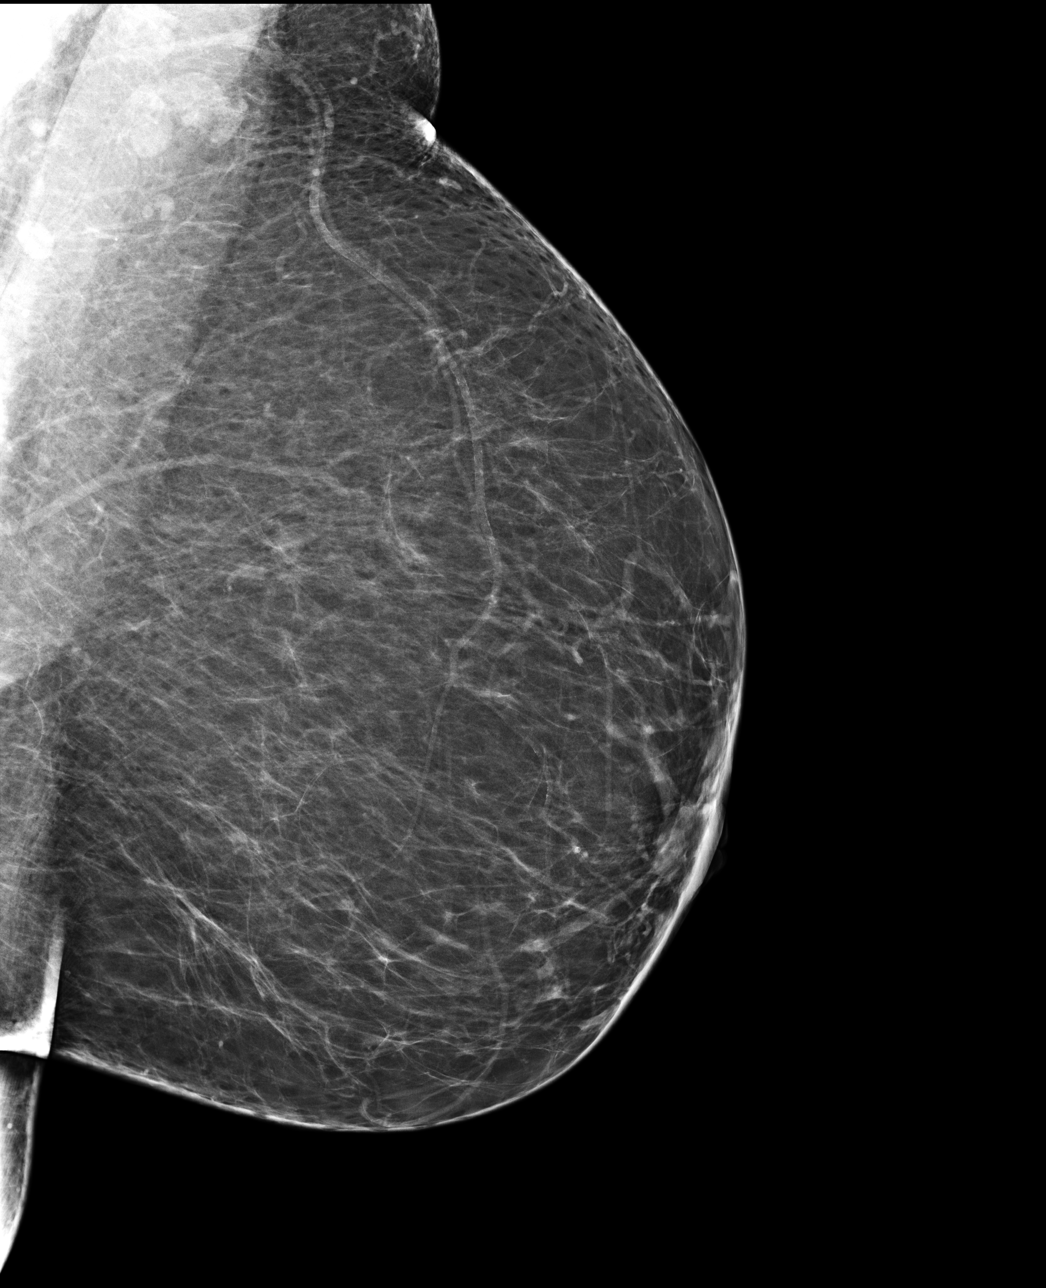

[R CC]
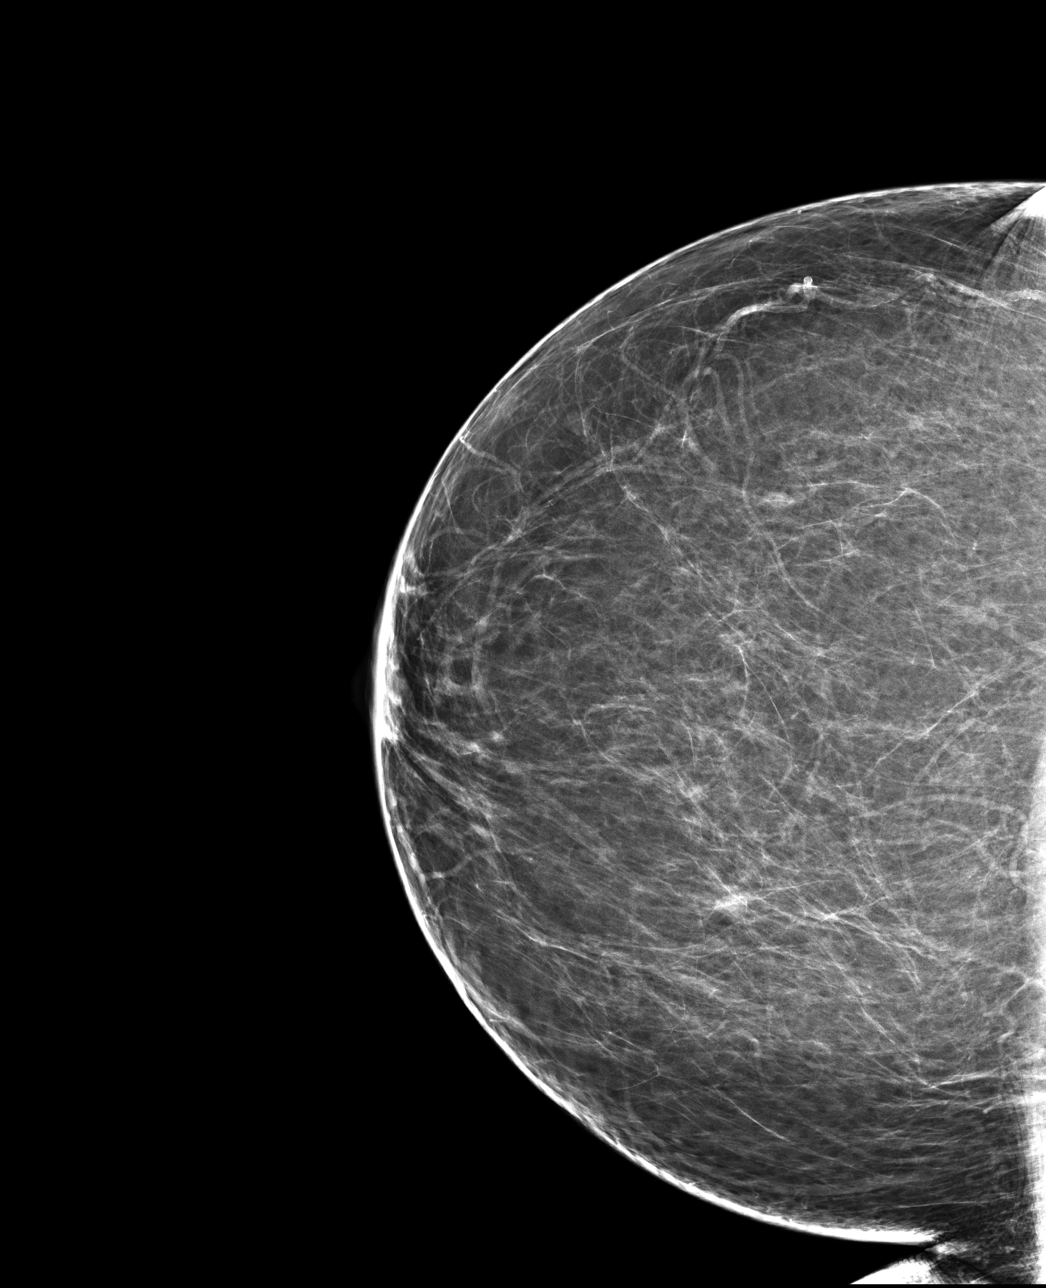

[4 of 4 positions shown; findings below may reference images not displayed]

FINDINGS: There are no findings suspicious for malignancy. Images were
processed with CAD.
IMPRESSION: No mammographic evidence of malignancy. A result letter of this
screening mammogram will be mailed directly to the patient.

RECOMMENDATION:
Screening mammogram in one year. (Code:[id number])

BI-RADS CATEGORY  1: Negative.

## 2017-12-02 ENCOUNTER — Other Ambulatory Visit: Payer: Self-pay | Admitting: Family

## 2017-12-02 ENCOUNTER — Encounter: Payer: Self-pay | Admitting: Family

## 2017-12-02 ENCOUNTER — Other Ambulatory Visit (HOSPITAL_COMMUNITY)
Admission: RE | Admit: 2017-12-02 | Discharge: 2017-12-02 | Disposition: A | Discharge status: Home / Self Care | Payer: 59 | Source: Ambulatory Visit | Attending: Family | Admitting: Family

## 2017-12-02 ENCOUNTER — Ambulatory Visit (INDEPENDENT_AMBULATORY_CARE_PROVIDER_SITE_OTHER): Payer: 59 | Admitting: Family

## 2017-12-02 VITALS — BP 160/92 | HR 81 | Temp 98.1°F | Resp 16 | Ht 64.0 in | Wt 185.1 lb

## 2017-12-02 DIAGNOSIS — R7303 Prediabetes: Secondary | ICD-10-CM | POA: Diagnosis not present

## 2017-12-02 DIAGNOSIS — Z0001 Encounter for general adult medical examination with abnormal findings: Secondary | ICD-10-CM | POA: Insufficient documentation

## 2017-12-02 DIAGNOSIS — Z8639 Personal history of other endocrine, nutritional and metabolic disease: Secondary | ICD-10-CM

## 2017-12-02 DIAGNOSIS — I10 Essential (primary) hypertension: Secondary | ICD-10-CM | POA: Diagnosis not present

## 2017-12-02 DIAGNOSIS — Z85048 Personal history of other malignant neoplasm of rectum, rectosigmoid junction, and anus: Secondary | ICD-10-CM | POA: Diagnosis not present

## 2017-12-02 DIAGNOSIS — J309 Allergic rhinitis, unspecified: Secondary | ICD-10-CM | POA: Diagnosis not present

## 2017-12-02 LAB — COMPREHENSIVE METABOLIC PANEL
ALK PHOS: 55 U/L (ref 39–117)
ALT: 16 U/L (ref 0–35)
AST: 14 U/L (ref 0–37)
Albumin: 4.3 g/dL (ref 3.5–5.2)
BUN: 16 mg/dL (ref 6–23)
CALCIUM: 9.6 mg/dL (ref 8.4–10.5)
CHLORIDE: 104 meq/L (ref 96–112)
CO2: 30 mEq/L (ref 19–32)
Creatinine, Ser: 0.84 mg/dL (ref 0.40–1.20)
GFR: 87.45 mL/min (ref >60.00)
Glucose, Bld: 99 mg/dL (ref 70–99)
POTASSIUM: 3.8 meq/L (ref 3.5–5.1)
SODIUM: 142 meq/L (ref 135–145)
TOTAL PROTEIN: 7.7 g/dL (ref 6.0–8.3)
Total Bilirubin: 0.4 mg/dL (ref 0.2–1.2)

## 2017-12-02 LAB — HEMOGLOBIN A1C: HEMOGLOBIN A1C: 6.6 % — AB (ref 4.6–6.5)

## 2017-12-02 LAB — VITAMIN D 25 HYDROXY (VIT D DEFICIENCY, FRACTURES): VITD: 33.71 ng/mL (ref 30.00–100.00)

## 2017-12-02 MED ORDER — BUDESONIDE-FORMOTEROL FUMARATE 80-4.5 MCG/ACT IN AERO: 2.0000 | INHALATION_SPRAY | Freq: Two times a day (BID) | RESPIRATORY_TRACT | 3 refills | Status: DC

## 2017-12-02 MED ORDER — LISINOPRIL 5 MG PO TABS: 5.0000 mg | ORAL_TABLET | Freq: Every day | ORAL | 3 refills | Status: DC

## 2017-12-02 MED ORDER — METFORMIN HCL ER 500 MG PO TB24: ORAL_TABLET | ORAL | 3 refills | Status: DC

## 2017-12-02 MED ORDER — ALBUTEROL SULFATE HFA 108 (90 BASE) MCG/ACT IN AERS: 2.0000 | INHALATION_SPRAY | Freq: Four times a day (QID) | RESPIRATORY_TRACT | 1 refills | Status: DC | PRN

## 2017-12-02 NOTE — Assessment & Plan Note (Signed)
Pending lab 

## 2017-12-02 NOTE — Progress Notes (Signed)
Subjective:    Patient ID: Diamond Hill, female    DOB: 1950-03-17, 67 y.o.   MRN: [id number]  CC: Diamond Hill is a 67 y.o. female who presents today for physical exam.    HPI: Doing well.  No new complaints today.    HTN- off the losartan due to recall. ON amlodipine. At  Home 129/79, 127/82, 153/90.  Denies exertional chest pain or pressure, numbness or tingling radiating to left arm or jaw, palpitations, dizziness, frequent headaches, changes in vision.   Productive cough for 3 years, waxes and wanes.  She is discussed this with prior providers.  She has not seen pulmonology for this.  Cough had gone away this year , however  when weather changes, returned.  Nonsmoker. Some wheezing, resolves with albuterol. Using 1-2 per week, not worse with exercise. Has been using albuterol with relief. Last year treated with prednisone and felt better. Not on flonase consistently. No CP, sob, fever, epigastric burning, belching.  No h/o asthma. Notes a lot of dust at work.  Never smoker.     CXR 2017 normal.   Colorectal Cancer Screening: h/o rectal cancer s/p radiation , Dr Diamond Hill.  Last colonoscopy 2012, told to repeat in 5 years, Dr Diamond Hill.  Breast Cancer Screening: Mammogram due next month.  Cervical Cancer Screening: due; h/o hysterectomy due fibroids. No vaginal bleeding. Unsure if has cervix. Has been having pap smear.  Bone Health screening/DEXA for 65+: Due Lung Cancer Screening: Doesn't have 30 year pack year history and age > 68 years. Immunizations       Tetanus - UTD        Pneumococcal - Candidate for. Declines today  Labs: Screening labs done prior. Exercise: Gets regular exercise.  Alcohol use: [place] Smoking/tobacco use: Nonsmoker.   HISTORY:  Past Medical History:  Diagnosis Date  . Blood in stool   . Cancer [facility]) 2005   rectal, Dr. Claybon Hill, Dr. Mallie Hill, Dr. Leamon Hill, s/p XRT  . Colon polyp   . Diverticulitis   . Heart murmur   . Hypertension     Past Surgical  History:  Procedure Laterality Date  . ABDOMINAL HYSTERECTOMY  1993   ovaries intact  . rectal cancer  2005   removal of lesion   Family History  Problem Relation Age of Onset  . Cancer Mother        colon  . Hyperlipidemia Mother   . Hypertension Mother   . Hyperlipidemia Father   . Hypertension Father   . Diabetes Father   . Heart disease Father   . Hypertension Other   . Breast cancer Neg Hx       ALLERGIES: Patient has no known allergies.  Current Outpatient Medications on File Prior to Visit  Medication Sig Dispense Refill  . amLODipine (NORVASC) 10 MG tablet TAKE 1 TABLET BY MOUTH DAILY 90 tablet 1  . b complex vitamins tablet Take 1 tablet by mouth daily.    Marland Kitchen escitalopram (LEXAPRO) 10 MG tablet Take 1 tablet (10 mg total) by mouth daily. 90 tablet 0  . fluticasone (FLONASE) 50 MCG/ACT nasal spray Place 2 sprays into both nostrils daily. 16 g 6  . Multiple Vitamin (MULTIVITAMIN) tablet Take 1 tablet by mouth daily.    . Vitamin D, Ergocalciferol, 2000 units CAPS Take by mouth daily at 6 (six) AM.     No current facility-administered medications on file prior to visit.     Social History   Tobacco Use  .  Smoking status: Never Smoker  . Smokeless tobacco: Never Used  Substance Use Topics  . Alcohol use: Yes  . Drug use: No    Review of Systems  Constitutional: Negative for chills, fever and unexpected weight change.  HENT: Positive for congestion and postnasal drip. Negative for sinus pressure and trouble swallowing.   Respiratory: Positive for cough and wheezing. Negative for shortness of breath.   Cardiovascular: Negative for chest pain, palpitations and leg swelling.  Gastrointestinal: Negative for abdominal distention, nausea and vomiting.  Genitourinary: Negative for dyspareunia, vaginal bleeding and vaginal discharge.  Musculoskeletal: Negative for arthralgias and myalgias.  Skin: Negative for rash.  Neurological: Negative for headaches.    Hematological: Negative for adenopathy.  Psychiatric/Behavioral: Negative for confusion.      Objective:    BP (!) 160/92 (BP Location: Left Arm, Patient Position: Sitting, Cuff Size: Large)   Pulse 81   Temp 98.1 F (36.7 C) (Oral)   Resp 16   Ht 5\' 4"  (1.626 m)   Wt 185 lb 2 oz (84 kg)   SpO2 97%   BMI 31.78 kg/m   BP Readings from Last 3 Encounters:  12/02/17 (!) 160/92  08/31/17 (!) 150/90  07/20/17 140/86   Wt Readings from Last 3 Encounters:  12/02/17 185 lb 2 oz (84 kg)  08/31/17 187 lb 8 oz (85 kg)  07/20/17 189 lb (85.7 kg)    Physical Exam  Constitutional: She appears well-developed and well-nourished.  HENT:  Head: Normocephalic and atraumatic.  Right Ear: Hearing, tympanic membrane, external ear and ear canal normal. No drainage, swelling or tenderness. No foreign bodies. Tympanic membrane is not erythematous and not bulging. No middle ear effusion. No decreased hearing is noted.  Left Ear: Hearing, tympanic membrane, external ear and ear canal normal. No drainage, swelling or tenderness. No foreign bodies. Tympanic membrane is not erythematous and not bulging.  No middle ear effusion. No decreased hearing is noted.  Nose: Nose normal. No rhinorrhea. Right sinus exhibits no maxillary sinus tenderness and no frontal sinus tenderness. Left sinus exhibits no maxillary sinus tenderness and no frontal sinus tenderness.  Mouth/Throat: Uvula is midline, oropharynx is clear and moist and mucous membranes are normal. No oropharyngeal exudate, posterior oropharyngeal edema, posterior oropharyngeal erythema or tonsillar abscesses.  Eyes: Conjunctivae are normal.  Neck: No thyroid mass and no thyromegaly present.  Cardiovascular: Normal rate, regular rhythm, normal heart sounds and normal pulses.  Pulmonary/Chest: Effort normal. She has wheezes in the right middle field. She has no rhonchi. She has no rales. Right breast exhibits no inverted nipple, no mass, no nipple  discharge, no skin change and no tenderness. Left breast exhibits no inverted nipple, no mass, no nipple discharge, no skin change and no tenderness. Breasts are symmetrical.  Few expiratory wheezes heard during exam. No masses or asymmetry appreciated during CBE.  Genitourinary: Uterus is not enlarged, not fixed and not tender. Right adnexum displays no mass, no tenderness and no fullness. Left adnexum displays no mass, no tenderness and no fullness.  Genitourinary Comments: Pap performed. No CMT. Unable to appreciated ovaries.  No cervix visualized   Lymphadenopathy:       Head (right side): No submental, no submandibular, no tonsillar, no preauricular, no posterior auricular and no occipital adenopathy present.       Head (left side): No submental, no submandibular, no tonsillar, no preauricular, no posterior auricular and no occipital adenopathy present.    She has no cervical adenopathy.  Right cervical: No superficial cervical, no deep cervical and no posterior cervical adenopathy present.      Left cervical: No superficial cervical, no deep cervical and no posterior cervical adenopathy present.    She has no axillary adenopathy.       Right axillary: No pectoral and no lateral adenopathy present.       Left axillary: No pectoral and no lateral adenopathy present. Neurological: She is alert.  Skin: Skin is warm and dry.  Psychiatric: She has a normal mood and affect. Her speech is normal and behavior is normal. Thought content normal.  Vitals reviewed.      Assessment & Plan:   Problem List Items Addressed This Visit      Cardiovascular and Mediastinum   Hypertension    Decently controlled however based on current guidelines of 120/80, I will start back patient on low-dose lisinopril.  She will monitor blood pressures at home.  Advised her to be cautious for hypotension, cough or worsening thereof.  Follow-up in 6 weeks.      Relevant Medications   lisinopril  (PRINIVIL,ZESTRIL) 5 MG tablet     Respiratory   Allergic rhinitis    Patient is not in any acute respiratory distress today.  I did Hear some wheezing on exam.  We will go ahead and do a trial of Symbicort.  Also advised her since her cough has been chronic to do over-the-counter Zantac, Flonase for trial.  She will come back in 6 weeks and we will reevaluate.  We will discuss formal lung testing at that time, imaging as well.      Relevant Medications   budesonide-formoterol (SYMBICORT) 80-4.5 MCG/ACT inhaler   albuterol (PROVENTIL HFA;VENTOLIN HFA) 108 (90 Base) MCG/ACT inhaler     Digestive   Personal history of rectal cancer    No surveillance recently from [facility] oncology.  Referral placed the patient can reestablish care.      Relevant Orders   Ambulatory referral to Gastroenterology   Ambulatory referral to Oncology     Other   Encounter for health maintenance examination with abnormal findings - Primary    Clinical breast exam performed today, mammogram has been ordered and patient understands schedule.  Pap smear performed however unable to appreciate cervix on exam.  Patient had hysterectomy prior to knowledge of rectal cancer due to fibroids.  We will discuss next year whether or not we continue to do Pap smear based on her history.  Screening labs done prior 09/2017. Dexa ordered      Relevant Orders   MM 3D SCREEN BREAST BILATERAL   Cytology - PAP   Ambulatory referral to Gastroenterology   Comprehensive metabolic panel   DG Bone Density   History of vitamin D deficiency    Pending lab .      Relevant Orders   VITAMIN D 25 Hydroxy (Vit-D Deficiency, Fractures)   Prediabetes    Pending lab.      Relevant Orders   Hemoglobin A1c       I have discontinued Reanne W. Paladino's losartan. I am also having her start on lisinopril and budesonide-formoterol. Additionally, I am having her maintain her b complex vitamins, multivitamin, escitalopram, amLODipine,  fluticasone, Vitamin D (Ergocalciferol), and albuterol.   Meds ordered this encounter  Medications  . lisinopril (PRINIVIL,ZESTRIL) 5 MG tablet    Sig: Take 1 tablet (5 mg total) by mouth daily.    Dispense:  90 tablet    Refill:  3  Order Specific Question:   Supervising Provider    Answer:   Crecencio Mc [[id number]]  . budesonide-formoterol (SYMBICORT) 80-4.5 MCG/ACT inhaler    Sig: Inhale 2 puffs into the lungs 2 (two) times daily.    Dispense:  1 Inhaler    Refill:  3    Order Specific Question:   Supervising Provider    Answer:   TULLO, TERESA L [[id number]]  . albuterol (PROVENTIL HFA;VENTOLIN HFA) 108 (90 Base) MCG/ACT inhaler    Sig: Inhale 2 puffs into the lungs every 6 (six) hours as needed for wheezing or shortness of breath.    Dispense:  1 Inhaler    Refill:  1    Return precautions given.   Risks, benefits, and alternatives of the medications and treatment plan prescribed today were discussed, and patient expressed understanding.   Education regarding symptom management and diagnosis given to patient on AVS.   Continue to follow with Burnard Hawthorne, FNP for routine health maintenance.   Diamond Hill and I agreed with plan.   [name], FNP

## 2017-12-02 NOTE — Assessment & Plan Note (Signed)
Decently controlled however based on current guidelines of 120/80, I will start back patient on low-dose lisinopril.  She will monitor blood pressures at home.  Advised her to be cautious for hypotension, cough or worsening thereof.  Follow-up in 6 weeks.

## 2017-12-02 NOTE — Assessment & Plan Note (Signed)
No surveillance recently from [facility] oncology.  Referral placed the patient can reestablish care.

## 2017-12-02 NOTE — Assessment & Plan Note (Signed)
Patient is not in any acute respiratory distress today.  I did Hear some wheezing on exam.  We will go ahead and do a trial of Symbicort.  Also advised her since her cough has been chronic to do over-the-counter Zantac, Flonase for trial.  She will come back in 6 weeks and we will reevaluate.  We will discuss formal lung testing at that time, imaging as well.

## 2017-12-02 NOTE — Assessment & Plan Note (Addendum)
Clinical breast exam performed today, mammogram has been ordered and patient understands schedule.  Pap smear performed however unable to appreciate cervix on exam.  Patient had hysterectomy prior to knowledge of rectal cancer due to fibroids.  We will discuss next year whether or not we continue to do Pap smear based on her history.  Screening labs done prior 09/2017. Dexa ordered

## 2017-12-02 NOTE — Patient Instructions (Addendum)
Trial of zantac '150mg'$  over the counter , take on hour prior to lunch and dinner.   Also STAY on flonase daily.   Start symbicort daily for prevention.   We placed a referral for mammogram this year. I asked that you call one the below locations and schedule this when it is convenient for you.   As discussed, I would like you to ask for 3D mammogram over the traditional 2D mammogram as new evidence suggest 3D is superior.   Please note that NOT all insurance companies cover 3D and you may have to pay a higher copay. You may call your insurance company to further clarify your benefits.   Options for [place], [place]  * [facility] [place] * Note if you ask for 3D mammogram at this location, you must request [place], [place] location*    Health Maintenance for Postmenopausal Women Menopause is a normal process in which your reproductive ability comes to an end. This process happens gradually over a span of months to years, usually between the ages of 43 and 106. Menopause is complete when you have missed 12 consecutive menstrual periods. It is important to talk with your health care provider about some of the most common conditions that affect postmenopausal women, such as heart disease, cancer, and bone loss (osteoporosis). Adopting a healthy lifestyle and getting preventive care can help to promote your health and wellness. Those actions can also lower your chances of developing some of these common conditions. What should I know about menopause? During menopause, you may experience a number of symptoms, such as:  Moderate-to-severe hot flashes.  Night sweats.  Decrease in sex drive.  Mood swings.  Headaches.  Tiredness.  Irritability.  Memory problems.  Insomnia.  Choosing to treat or not to treat  menopausal changes is an individual decision that you make with your health care provider. What should I know about hormone replacement therapy and supplements? Hormone therapy products are effective for treating symptoms that are associated with menopause, such as hot flashes and night sweats. Hormone replacement carries certain risks, especially as you become older. If you are thinking about using estrogen or estrogen with progestin treatments, discuss the benefits and risks with your health care provider. What should I know about heart disease and stroke? Heart disease, heart attack, and stroke become more likely as you age. This may be due, in part, to the hormonal changes that your body experiences during menopause. These can affect how your body processes dietary fats, triglycerides, and cholesterol. Heart attack and stroke are both medical emergencies. There are many things that you can do to help prevent heart disease and stroke:  Have your blood pressure checked at least every 1-2 years. High blood pressure causes heart disease and increases the risk of stroke.  If you are 11-8 years old, ask your health care provider if you should take aspirin to prevent a heart attack or a stroke.  Do not use any tobacco products, including cigarettes, chewing tobacco, or electronic cigarettes. If you need help quitting, ask your health care provider.  It is important to eat a healthy diet and maintain a healthy weight. ? Be sure to include plenty of vegetables, fruits, low-fat dairy products, and lean protein. ? Avoid eating foods that are high in solid fats, added sugars, or salt (  sodium).  Get regular exercise. This is one of the most important things that you can do for your health. ? Try to exercise for at least 150 minutes each week. The type of exercise that you do should increase your heart rate and make you sweat. This is known as moderate-intensity exercise. ? Try to do strengthening  exercises at least twice each week. Do these in addition to the moderate-intensity exercise.  Know your numbers.Ask your health care provider to check your cholesterol and your blood glucose. Continue to have your blood tested as directed by your health care provider.  What should I know about cancer screening? There are several types of cancer. Take the following steps to reduce your risk and to catch any cancer development as early as possible. Breast Cancer  Practice breast self-awareness. ? This means understanding how your breasts normally appear and feel. ? It also means doing regular breast self-exams. Let your health care provider know about any changes, no matter how small.  If you are 66 or older, have a clinician do a breast exam (clinical breast exam or CBE) every year. Depending on your age, family history, and medical history, it may be recommended that you also have a yearly breast X-ray (mammogram).  If you have a family history of breast cancer, talk with your health care provider about genetic screening.  If you are at high risk for breast cancer, talk with your health care provider about having an MRI and a mammogram every year.  Breast cancer (BRCA) gene test is recommended for women who have family members with BRCA-related cancers. Results of the assessment will determine the need for genetic counseling and BRCA1 and for BRCA2 testing. BRCA-related cancers include these types: ? Breast. This occurs in males or females. ? Ovarian. ? Tubal. This may also be called fallopian tube cancer. ? Cancer of the abdominal or pelvic lining (peritoneal cancer). ? Prostate. ? Pancreatic.  Cervical, Uterine, and Ovarian Cancer Your health care provider may recommend that you be screened regularly for cancer of the pelvic organs. These include your ovaries, uterus, and vagina. This screening involves a pelvic exam, which includes checking for microscopic changes to the surface of  your cervix (Pap test).  For women ages 21-65, health care providers may recommend a pelvic exam and a Pap test every three years. For women ages 21-65, they may recommend the Pap test and pelvic exam, combined with testing for human papilloma virus (HPV), every five years. Some types of HPV increase your risk of cervical cancer. Testing for HPV may also be done on women of any age who have unclear Pap test results.  Other health care providers may not recommend any screening for nonpregnant women who are considered low risk for pelvic cancer and have no symptoms. Ask your health care provider if a screening pelvic exam is right for you.  If you have had past treatment for cervical cancer or a condition that could lead to cancer, you need Pap tests and screening for cancer for at least 20 years after your treatment. If Pap tests have been discontinued for you, your risk factors (such as having a new sexual partner) need to be reassessed to determine if you should start having screenings again. Some women have medical problems that increase the chance of getting cervical cancer. In these cases, your health care provider may recommend that you have screening and Pap tests more often.  If you have a family history of uterine cancer  or ovarian cancer, talk with your health care provider about genetic screening.  If you have vaginal bleeding after reaching menopause, tell your health care provider.  There are currently no reliable tests available to screen for ovarian cancer.  Lung Cancer Lung cancer screening is recommended for adults 41-64 years old who are at high risk for lung cancer because of a history of smoking. A yearly low-dose CT scan of the lungs is recommended if you:  Currently smoke.  Have a history of at least 30 pack-years of smoking and you currently smoke or have quit within the past 15 years. A pack-year is smoking an average of one pack of cigarettes per day for one year.  Yearly  screening should:  Continue until it has been 15 years since you quit.  Stop if you develop a health problem that would prevent you from having lung cancer treatment.  Colorectal Cancer  This type of cancer can be detected and can often be prevented.  Routine colorectal cancer screening usually begins at age 34 and continues through age 44.  If you have risk factors for colon cancer, your health care provider may recommend that you be screened at an earlier age.  If you have a family history of colorectal cancer, talk with your health care provider about genetic screening.  Your health care provider may also recommend using home test kits to check for hidden blood in your stool.  A small camera at the end of a tube can be used to examine your colon directly (sigmoidoscopy or colonoscopy). This is done to check for the earliest forms of colorectal cancer.  Direct examination of the colon should be repeated every 5-10 years until age 32. However, if early forms of precancerous polyps or small growths are found or if you have a family history or genetic risk for colorectal cancer, you may need to be screened more often.  Skin Cancer  Check your skin from head to toe regularly.  Monitor any moles. Be sure to tell your health care provider: ? About any new moles or changes in moles, especially if there is a change in a mole's shape or color. ? If you have a mole that is larger than the size of a pencil eraser.  If any of your family members has a history of skin cancer, especially at a young age, talk with your health care provider about genetic screening.  Always use sunscreen. Apply sunscreen liberally and repeatedly throughout the day.  Whenever you are outside, protect yourself by wearing long sleeves, pants, a wide-brimmed hat, and sunglasses.  What should I know about osteoporosis? Osteoporosis is a condition in which bone destruction happens more quickly than new bone creation.  After menopause, you may be at an increased risk for osteoporosis. To help prevent osteoporosis or the bone fractures that can happen because of osteoporosis, the following is recommended:  If you are 51-49 years old, get at least 1,000 mg of calcium and at least 600 mg of vitamin D per day.  If you are older than age 101 but younger than age 52, get at least 1,200 mg of calcium and at least 600 mg of vitamin D per day.  If you are older than age 42, get at least 1,200 mg of calcium and at least 800 mg of vitamin D per day.  Smoking and excessive alcohol intake increase the risk of osteoporosis. Eat foods that are rich in calcium and vitamin D, and do weight-bearing exercises  several times each week as directed by your health care provider. What should I know about how menopause affects my mental health? Depression may occur at any age, but it is more common as you become older. Common symptoms of depression include:  Low or sad mood.  Changes in sleep patterns.  Changes in appetite or eating patterns.  Feeling an overall lack of motivation or enjoyment of activities that you previously enjoyed.  Frequent crying spells.  Talk with your health care provider if you think that you are experiencing depression. What should I know about immunizations? It is important that you get and maintain your immunizations. These include:  Tetanus, diphtheria, and pertussis (Tdap) booster vaccine.  Influenza every year before the flu season begins.  Pneumonia vaccine.  Shingles vaccine.  Your health care provider may also recommend other immunizations. This information is not intended to replace advice given to you by your health care provider. Make sure you discuss any questions you have with your health care provider. Document Released: 04/16/2005 Document Revised: 09/12/2015 Document Reviewed: 11/26/2014 Elsevier Interactive Patient Education  2018 [employer].

## 2017-12-06 LAB — CYTOLOGY - PAP
Adequacy: ABSENT
Diagnosis: NEGATIVE
HPV: NOT DETECTED

## 2018-01-10 ENCOUNTER — Encounter: Payer: Self-pay | Admitting: Family

## 2018-01-16 ENCOUNTER — Encounter: Payer: Self-pay | Admitting: Family

## 2018-01-16 ENCOUNTER — Ambulatory Visit (INDEPENDENT_AMBULATORY_CARE_PROVIDER_SITE_OTHER): Payer: 59 | Admitting: Family

## 2018-01-16 VITALS — BP 128/88 | HR 69 | Temp 98.6°F | Resp 15 | Ht 64.0 in | Wt 184.4 lb

## 2018-01-16 DIAGNOSIS — J309 Allergic rhinitis, unspecified: Secondary | ICD-10-CM

## 2018-01-16 DIAGNOSIS — F419 Anxiety disorder, unspecified: Secondary | ICD-10-CM

## 2018-01-16 DIAGNOSIS — I10 Essential (primary) hypertension: Secondary | ICD-10-CM | POA: Diagnosis not present

## 2018-01-16 MED ORDER — SERTRALINE HCL 50 MG PO TABS: 50.0000 mg | ORAL_TABLET | Freq: Every day | ORAL | 3 refills | Status: DC

## 2018-01-16 NOTE — Assessment & Plan Note (Signed)
No respiratory distress. No wheezes on exam. Declines CXR. Has not been on symbicort. Will start this today. Close follow up in 2 months to see if symptoms improved.

## 2018-01-16 NOTE — Patient Instructions (Addendum)
Pepcid AC for reflux. This is over the counter.   Symbicort every day.    Trial of zoloft at bedtime  Monitor blood pressure,  Goal is less than 120/80, based on newest guidelines; if persistently higher, please make sooner follow up appointment so we can recheck you blood pressure and manage medications

## 2018-01-16 NOTE — Progress Notes (Signed)
Subjective:    Patient ID: Diamond Hill, female    DOB: 1950/10/15, 67 y.o.   MRN: [id number]  CC: Diamond Hill is a 67 y.o. female who presents today for follow up.   HPI: HTN- started on lisinopril. At home, gets 120/ 76, 130/80. Denies exertional chest pain or pressure, numbness or tingling radiating to left arm or jaw, palpitations, dizziness, frequent headaches, changes in vision.    Continues to cough and have wheeze, however has improved. NO fever, SOB. Clear nasal discharge. hasnt used symbicort. Using albuterol 1-2 per week.  Tried zantac but then recalled so has stopped medication.  On zyrtec.    [facility] had called. Plans to schedule appointment.   Has had depression in the past, had been on lexapro. Not sleeping well. Stress at work.  No si/hi. Stopped lexapro a month ago.   One glass of wine a couple of times per week.    HISTORY:  Past Medical History:  Diagnosis Date  . Blood in stool   . Cancer [facility]) 2005   rectal, Dr. Claybon Jabs, Dr. Mallie Snooks, Dr. Leamon Arnt, s/p XRT  . Colon polyp   . Diverticulitis   . Heart murmur   . Hypertension    Past Surgical History:  Procedure Laterality Date  . ABDOMINAL HYSTERECTOMY  1993   ovaries intact  . rectal cancer  2005   removal of lesion   Family History  Problem Relation Age of Onset  . Cancer Mother        colon  . Hyperlipidemia Mother   . Hypertension Mother   . Hyperlipidemia Father   . Hypertension Father   . Diabetes Father   . Heart disease Father   . Hypertension Other   . Breast cancer Neg Hx     Allergies: Patient has no known allergies. Current Outpatient Medications on File Prior to Visit  Medication Sig Dispense Refill  . albuterol (PROVENTIL HFA;VENTOLIN HFA) 108 (90 Base) MCG/ACT inhaler Inhale 2 puffs into the lungs every 6 (six) hours as needed for wheezing or shortness of breath. 1 Inhaler 1  . amLODipine (NORVASC) 10 MG tablet TAKE 1 TABLET BY MOUTH DAILY 90 tablet 1  . b complex  vitamins tablet Take 1 tablet by mouth daily.    . fluticasone (FLONASE) 50 MCG/ACT nasal spray Place 2 sprays into both nostrils daily. 16 g 6  . lisinopril (PRINIVIL,ZESTRIL) 5 MG tablet Take 1 tablet (5 mg total) by mouth daily. 90 tablet 3  . metFORMIN (GLUCOPHAGE XR) 500 MG 24 hr tablet Start 500mg  PO qpm. 90 tablet 3  . Multiple Vitamin (MULTIVITAMIN) tablet Take 1 tablet by mouth daily.    . Vitamin D, Ergocalciferol, 2000 units CAPS Take by mouth daily at 6 (six) AM.    . budesonide-formoterol (SYMBICORT) 80-4.5 MCG/ACT inhaler Inhale 2 puffs into the lungs 2 (two) times daily. (Patient not taking: Reported on 01/16/2018) 1 Inhaler 3   No current facility-administered medications on file prior to visit.     Social History   Tobacco Use  . Smoking status: Never Smoker  . Smokeless tobacco: Never Used  Substance Use Topics  . Alcohol use: Yes  . Drug use: No    Review of Systems  Constitutional: Negative for chills and fever.  Respiratory: Positive for cough and wheezing. Negative for shortness of breath.   Cardiovascular: Negative for chest pain and palpitations.  Gastrointestinal: Negative for nausea and vomiting.  Psychiatric/Behavioral: Positive for sleep disturbance. Negative for  suicidal ideas. The patient is nervous/anxious.       Objective:    BP 128/88 (BP Location: Left Arm, Patient Position: Sitting, Cuff Size: Large)   Pulse 69   Temp 98.6 F (37 C) (Oral)   Resp 15   Ht 5\' 4"  (1.626 m)   Wt 184 lb 6 oz (83.6 kg)   SpO2 98%   BMI 31.65 kg/m  BP Readings from Last 3 Encounters:  01/16/18 128/88  12/02/17 (!) 160/92  08/31/17 (!) 150/90   Wt Readings from Last 3 Encounters:  01/16/18 184 lb 6 oz (83.6 kg)  12/02/17 185 lb 2 oz (84 kg)  08/31/17 187 lb 8 oz (85 kg)    Physical Exam  Constitutional: She appears well-developed and well-nourished.  Eyes: Conjunctivae are normal.  Cardiovascular: Normal rate, regular rhythm, normal heart sounds and  normal pulses.  Pulmonary/Chest: Effort normal and breath sounds normal. She has no wheezes. She has no rhonchi. She has no rales.  Neurological: She is alert.  Skin: Skin is warm and dry.  Psychiatric: She has a normal mood and affect. Her speech is normal and behavior is normal. Thought content normal.  Vitals reviewed.      Assessment & Plan:   Problem List Items Addressed This Visit      Cardiovascular and Mediastinum   Hypertension    Improved. DBP a little elevated. Will monitor and increase lisinopril if persists.         Respiratory   Allergic rhinitis    No respiratory distress. No wheezes on exam. Declines CXR. Has not been on symbicort. Will start this today. Close follow up in 2 months to see if symptoms improved.         Other   Anxiety - Primary    Off lexapro. Will trial zoloft. Close followup.       Relevant Medications   sertraline (ZOLOFT) 50 MG tablet       I have discontinued Diamond Hill's escitalopram. I am also having her start on sertraline. Additionally, I am having her maintain her b complex vitamins, multivitamin, amLODipine, fluticasone, Vitamin D (Ergocalciferol), lisinopril, budesonide-formoterol, albuterol, and metFORMIN.   Meds ordered this encounter  Medications  . sertraline (ZOLOFT) 50 MG tablet    Sig: Take 1 tablet (50 mg total) by mouth at bedtime.    Dispense:  90 tablet    Refill:  3    Order Specific Question:   Supervising Provider    Answer:   Crecencio Mc [[id number]]    Return precautions given.   Risks, benefits, and alternatives of the medications and treatment plan prescribed today were discussed, and patient expressed understanding.   Education regarding symptom management and diagnosis given to patient on AVS.  Continue to follow with Burnard Hawthorne, FNP for routine health maintenance.   Diamond Hill and I agreed with plan.   [name], FNP

## 2018-01-16 NOTE — Assessment & Plan Note (Signed)
Off lexapro. Will trial zoloft. Close followup.

## 2018-01-16 NOTE — Assessment & Plan Note (Signed)
Improved. DBP a little elevated. Will monitor and increase lisinopril if persists.

## 2018-03-02 ENCOUNTER — Other Ambulatory Visit: Payer: Self-pay | Admitting: Family

## 2018-03-02 DIAGNOSIS — I10 Essential (primary) hypertension: Secondary | ICD-10-CM

## 2018-03-20 ENCOUNTER — Ambulatory Visit (INDEPENDENT_AMBULATORY_CARE_PROVIDER_SITE_OTHER): Payer: 59 | Admitting: Family

## 2018-03-20 ENCOUNTER — Encounter: Payer: Self-pay | Admitting: Family

## 2018-03-20 VITALS — BP 120/90 | HR 89 | Temp 98.6°F | Wt 180.0 lb

## 2018-03-20 DIAGNOSIS — C20 Malignant neoplasm of rectum: Secondary | ICD-10-CM | POA: Diagnosis not present

## 2018-03-20 DIAGNOSIS — I10 Essential (primary) hypertension: Secondary | ICD-10-CM | POA: Diagnosis not present

## 2018-03-20 DIAGNOSIS — Z13820 Encounter for screening for osteoporosis: Secondary | ICD-10-CM

## 2018-03-20 DIAGNOSIS — J309 Allergic rhinitis, unspecified: Secondary | ICD-10-CM

## 2018-03-20 DIAGNOSIS — Z85048 Personal history of other malignant neoplasm of rectum, rectosigmoid junction, and anus: Secondary | ICD-10-CM | POA: Diagnosis not present

## 2018-03-20 DIAGNOSIS — F419 Anxiety disorder, unspecified: Secondary | ICD-10-CM

## 2018-03-20 DIAGNOSIS — Z1239 Encounter for other screening for malignant neoplasm of breast: Secondary | ICD-10-CM

## 2018-03-20 DIAGNOSIS — R7303 Prediabetes: Secondary | ICD-10-CM | POA: Diagnosis not present

## 2018-03-20 LAB — COMPREHENSIVE METABOLIC PANEL
ALK PHOS: 58 U/L (ref 39–117)
ALT: 13 U/L (ref 0–35)
AST: 13 U/L (ref 0–37)
Albumin: 4.2 g/dL (ref 3.5–5.2)
BILIRUBIN TOTAL: 0.5 mg/dL (ref 0.2–1.2)
BUN: 14 mg/dL (ref 6–23)
CO2: 30 meq/L (ref 19–32)
Calcium: 9.6 mg/dL (ref 8.4–10.5)
Chloride: 105 mEq/L (ref 96–112)
Creatinine, Ser: 0.81 mg/dL (ref 0.40–1.20)
GFR: 91.26 mL/min (ref >60.00)
GLUCOSE: 92 mg/dL (ref 70–99)
POTASSIUM: 3.9 meq/L (ref 3.5–5.1)
Sodium: 142 mEq/L (ref 135–145)
Total Protein: 7.1 g/dL (ref 6.0–8.3)

## 2018-03-20 LAB — HEMOGLOBIN A1C: Hgb A1c MFr Bld: 6.4 % (ref 4.6–6.5)

## 2018-03-20 NOTE — Patient Instructions (Addendum)
Call [facility] PLEASE PLEASE and schedule Bone density scan and  Mammogram.   Start excercising and see if fatigue improves.   Please call oncologist and GI for colonoscopy .Very important .   Monitor blood pressure,  Goal is less than 120/80, based on newest guidelines; if persistently higher, please make sooner follow up appointment so we can recheck you blood pressure and manage medications

## 2018-03-20 NOTE — Assessment & Plan Note (Signed)
Compliant with metformin, pending A1c

## 2018-03-20 NOTE — Assessment & Plan Note (Signed)
Diastolic blood pressure mildly elevated.  Patient will keep blood pressure log at home regarding this.  Will titrate medications based on home readings.  Close follow-up

## 2018-03-20 NOTE — Assessment & Plan Note (Signed)
Symptoms have improved on Symbicort, will continue.

## 2018-03-20 NOTE — Progress Notes (Signed)
Subjective:    Patient ID: Diamond Hill, female    DOB: [birth year], 68 y.o.   MRN: [id number]  CC: Diamond Hill is a 68 y.o. female who presents today for follow up.   HPI: Feels well today, no complaints   HTN- at home, 120/80. No cp.   Depression- Improved on zoloft, sleeping well.  Still tired, gets up at 330am and goes to bed around 9. No exercise. Doesn't snore.   H/o rectal cancer-no rectal pain, bleeding.  DM-compliant with metformin  Allergic rhinitis-compliant with symbicort. No wheezing, SOB. Rarely using albuterol.   States will call GI for colonoscopy and also her oncologist.  Due for mammogram, bone density.  Declines pneumococcal vaccine today HISTORY:  Past Medical History:  Diagnosis Date  . Blood in stool   . Cancer [facility]) 2005   rectal, Dr. Claybon Jabs, Dr. Mallie Snooks, Dr. Leamon Arnt, s/p XRT  . Colon polyp   . Diverticulitis   . Heart murmur   . Hypertension    Past Surgical History:  Procedure Laterality Date  . ABDOMINAL HYSTERECTOMY  1993   ovaries intact  . rectal cancer  2005   removal of lesion   Family History  Problem Relation Age of Onset  . Cancer Mother        colon  . Hyperlipidemia Mother   . Hypertension Mother   . Hyperlipidemia Father   . Hypertension Father   . Diabetes Father   . Heart disease Father   . Hypertension Other   . Breast cancer Neg Hx     Allergies: Patient has no known allergies. Current Outpatient Medications on File Prior to Visit  Medication Sig Dispense Refill  . amLODipine (NORVASC) 10 MG tablet TAKE 1 TABLET BY MOUTH DAILY 90 tablet 1  . b complex vitamins tablet Take 1 tablet by mouth daily.    . budesonide-formoterol (SYMBICORT) 80-4.5 MCG/ACT inhaler Inhale 2 puffs into the lungs 2 (two) times daily. 1 Inhaler 3  . fluticasone (FLONASE) 50 MCG/ACT nasal spray Place 2 sprays into both nostrils daily. 16 g 6  . lisinopril (PRINIVIL,ZESTRIL) 5 MG tablet Take 1 tablet (5 mg total) by mouth daily. 90 tablet 3   . metFORMIN (GLUCOPHAGE XR) 500 MG 24 hr tablet Start 500mg  PO qpm. 90 tablet 3  . Multiple Vitamin (MULTIVITAMIN) tablet Take 1 tablet by mouth daily.    . sertraline (ZOLOFT) 50 MG tablet Take 1 tablet (50 mg total) by mouth at bedtime. 90 tablet 3  . Vitamin D, Ergocalciferol, 2000 units CAPS Take by mouth daily at 6 (six) AM.    . albuterol (PROVENTIL HFA;VENTOLIN HFA) 108 (90 Base) MCG/ACT inhaler Inhale 2 puffs into the lungs every 6 (six) hours as needed for wheezing or shortness of breath. (Patient not taking: Reported on 03/20/2018) 1 Inhaler 1   No current facility-administered medications on file prior to visit.     Social History   Tobacco Use  . Smoking status: Never Smoker  . Smokeless tobacco: Never Used  Substance Use Topics  . Alcohol use: Yes  . Drug use: No    Review of Systems  Constitutional: Positive for fatigue. Negative for chills and fever.  Respiratory: Negative for cough and shortness of breath.   Cardiovascular: Negative for chest pain and palpitations.  Gastrointestinal: Negative for abdominal distention, anal bleeding, blood in stool, constipation, nausea and vomiting.  Psychiatric/Behavioral: Negative for sleep disturbance. The patient is not nervous/anxious.       Objective:  BP 120/90   Pulse 89   Temp 98.6 F (37 C)   Wt 180 lb (81.6 kg)   SpO2 98%   BMI 30.90 kg/m  BP Readings from Last 3 Encounters:  03/20/18 120/90  01/16/18 128/88  12/02/17 (!) 160/92   Wt Readings from Last 3 Encounters:  03/20/18 180 lb (81.6 kg)  01/16/18 184 lb 6 oz (83.6 kg)  12/02/17 185 lb 2 oz (84 kg)    Physical Exam Vitals signs reviewed.  Constitutional:      Appearance: She is well-developed.  Eyes:     Conjunctiva/sclera: Conjunctivae normal.  Cardiovascular:     Rate and Rhythm: Normal rate and regular rhythm.     Pulses: Normal pulses.     Heart sounds: Normal heart sounds.  Pulmonary:     Effort: Pulmonary effort is normal.     Breath  sounds: Normal breath sounds. No wheezing, rhonchi or rales.  Skin:    General: Skin is warm and dry.  Neurological:     Mental Status: She is alert.  Psychiatric:        Speech: Speech normal.        Behavior: Behavior normal.        Thought Content: Thought content normal.        Assessment & Plan:   Problem List Items Addressed This Visit      Cardiovascular and Mediastinum   Hypertension    Diastolic blood pressure mildly elevated.  Patient will keep blood pressure log at home regarding this.  Will titrate medications based on home readings.  Close follow-up        Respiratory   Allergic rhinitis    Symptoms have improved on Symbicort, will continue.        Digestive   Personal history of rectal cancer    Again, reemphasized the importance of surveillance with oncology.  She is also due for her colonoscopy which she is aware of.  She will call GI per patient and has number.  No referral needed.      Rectal adenocarcinoma ([place])     Other   Anxiety    Improved on Zoloft, will continue.      Prediabetes    Compliant with metformin, pending A1c          I am having Diamond Hill maintain her b complex vitamins, multivitamin, fluticasone, Vitamin D (Ergocalciferol), lisinopril, budesonide-formoterol, albuterol, metFORMIN, sertraline, and amLODipine.   No orders of the defined types were placed in this encounter.   Return precautions given.   Risks, benefits, and alternatives of the medications and treatment plan prescribed today were discussed, and patient expressed understanding.   Education regarding symptom management and diagnosis given to patient on AVS.  Continue to follow with Burnard Hawthorne, FNP for routine health maintenance.   Diamond Hill and I agreed with plan.   [name], FNP

## 2018-03-20 NOTE — Assessment & Plan Note (Signed)
Improved on Zoloft, will continue.

## 2018-03-20 NOTE — Assessment & Plan Note (Signed)
Again, reemphasized the importance of surveillance with oncology.  She is also due for her colonoscopy which she is aware of.  She will call GI per patient and has number.  No referral needed.

## 2018-04-04 ENCOUNTER — Telehealth: Payer: Self-pay | Admitting: Family

## 2018-04-04 NOTE — Telephone Encounter (Signed)
Pt dropped off a annual physial verification form. Please have Arnett complete and fax along with the note from [place]. Business card attached where and to whom to fax to. Papers are up front in Arnett's color folder.

## 2018-04-06 NOTE — Telephone Encounter (Signed)
I have filled out & faxed to [place].

## 2018-04-10 ENCOUNTER — Encounter: Payer: Self-pay | Admitting: Family

## 2018-07-26 ENCOUNTER — Ambulatory Visit: Payer: Self-pay

## 2018-07-26 ENCOUNTER — Telehealth: Payer: Self-pay | Admitting: Family

## 2018-07-26 NOTE — Telephone Encounter (Signed)
Pt was possibly exposed to [place], a person on 2nd shift tested positive for COVID. She is on 1st and was never exposed to patient. She wants to know if she should stay at home because of age. Please call her at home.

## 2018-07-26 NOTE — Telephone Encounter (Signed)
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 On yesterday, both employees were sent home to see their provider and get tested. She says the plant closed down yesterday for deep clean/disinfection. She says she's not having symptoms and the employee wore a mask while working, but was sent home sick on Sunday evening. She asks should she get tested or just wait. I advised she will need a virtual visit to discuss. I called the office and spoke to Santiago Glad, [facility] who asked to speak to the patient, the call was connected successfully.   Answer Assessment - Initial Assessment Questions 1. CLOSE CONTACT: "Who is the person with the confirmed or suspected COVID-19 infection that you were exposed to?"     Employee on another shift 2. PLACE of CONTACT: "Where were you when you were exposed to COVID-19?" (e.g., home, school, medical waiting room; which city?)     Job in [place], [place] 3. TYPE of CONTACT: "How much contact was there?" (e.g., sitting next to, live in same house, work in same office, same building)     Work same building and area 4. DURATION of CONTACT: "How long were you in contact with the COVID-19 patient?" (e.g., a few seconds, passed by person, a few minutes, live with the patient)     7 1/2 hour shift 5. DATE of CONTACT: "When did you have contact with a COVID-19 patient?" (e.g., how many days ago)     07/25/18 6. TRAVEL: "Have you traveled out of the country recently?" If so, "When and where?"     * Also ask about out-of-state travel, since the CDC has identified some high risk cities for community spread in the [place].     * Note: Travel becomes less relevant if there is widespread community transmission where the patient lives.     No 7. COMMUNITY SPREAD: "Are there lots of cases of COVID-19 (community spread)  where you live?" (See public health department website, if unsure)   * MAJOR community spread: high number of cases; numbers of cases are increasing; many people hospitalized.   * MINOR community spread: low number of cases; not increasing; few or no people hospitalized     No 8. SYMPTOMS: "Do you have any symptoms?" (e.g., fever, cough, breathing difficulty)     No 9. PREGNANCY OR POSTPARTUM: "Is there any chance you are pregnant?" "When was your last menstrual period?" "Did you deliver in the last 2 weeks?"     No 10. HIGH RISK: "Do you have any heart or lung problems? Do you have a weak immune system?" (e.g., CHF, COPD, asthma, HIV positive, chemotherapy, renal failure, diabetes mellitus, sickle cell anemia)     Maybe bronchitis  Protocols used: CORONAVIRUS (COVID-19) EXPOSURE-A-AH

## 2018-07-26 NOTE — Telephone Encounter (Signed)
Call pt  There is no rule book here.  I would advise appt to discuss.   can schedule doxy?

## 2018-07-26 NOTE — Telephone Encounter (Signed)
I spoke with patient & she had just wanted some guidance on this. She declined virtual visit for now, but she will call back if she began to have any symptoms. She was not directly exposed & her place of employment did close down to clean.

## 2018-07-26 NOTE — Telephone Encounter (Signed)
Called and spoke with the patient, I explained to the patient that we only setup testing if you have symptoms, pt states she is having no symptoms at all.  I informed her to wear mask, keep washing hands and disinfect things touched and I gave her a list of symptoms that if she experience any call back immediately and we will send her  For testing.  Nina,cma

## 2018-08-29 ENCOUNTER — Other Ambulatory Visit: Payer: Self-pay | Admitting: Family

## 2018-08-29 DIAGNOSIS — I10 Essential (primary) hypertension: Secondary | ICD-10-CM

## 2018-10-04 ENCOUNTER — Other Ambulatory Visit: Payer: Self-pay | Admitting: Family

## 2018-10-04 DIAGNOSIS — I10 Essential (primary) hypertension: Secondary | ICD-10-CM

## 2018-12-04 ENCOUNTER — Other Ambulatory Visit: Payer: Self-pay

## 2018-12-04 DIAGNOSIS — I10 Essential (primary) hypertension: Secondary | ICD-10-CM

## 2018-12-04 MED ORDER — LISINOPRIL 5 MG PO TABS: 5.0000 mg | ORAL_TABLET | Freq: Every day | ORAL | 3 refills | Status: DC

## 2019-01-01 ENCOUNTER — Other Ambulatory Visit: Payer: Self-pay

## 2019-01-01 DIAGNOSIS — J309 Allergic rhinitis, unspecified: Secondary | ICD-10-CM

## 2019-01-01 MED ORDER — BUDESONIDE-FORMOTEROL FUMARATE 80-4.5 MCG/ACT IN AERO: 2.0000 | INHALATION_SPRAY | Freq: Two times a day (BID) | RESPIRATORY_TRACT | 3 refills | Status: DC

## 2019-05-28 ENCOUNTER — Other Ambulatory Visit: Payer: Self-pay | Admitting: Family

## 2019-05-28 DIAGNOSIS — I10 Essential (primary) hypertension: Secondary | ICD-10-CM

## 2019-09-17 ENCOUNTER — Other Ambulatory Visit: Payer: Self-pay

## 2019-09-17 ENCOUNTER — Ambulatory Visit (INDEPENDENT_AMBULATORY_CARE_PROVIDER_SITE_OTHER): Payer: Medicare HMO | Admitting: Family

## 2019-09-17 ENCOUNTER — Encounter: Payer: Self-pay | Admitting: Family

## 2019-09-17 VITALS — BP 130/90 | HR 91 | Temp 97.9°F | Ht 64.02 in | Wt 196.2 lb

## 2019-09-17 DIAGNOSIS — I10 Essential (primary) hypertension: Secondary | ICD-10-CM | POA: Diagnosis not present

## 2019-09-17 DIAGNOSIS — E119 Type 2 diabetes mellitus without complications: Secondary | ICD-10-CM | POA: Diagnosis not present

## 2019-09-17 DIAGNOSIS — R062 Wheezing: Secondary | ICD-10-CM | POA: Diagnosis not present

## 2019-09-17 DIAGNOSIS — E118 Type 2 diabetes mellitus with unspecified complications: Secondary | ICD-10-CM | POA: Insufficient documentation

## 2019-09-17 MED ORDER — LISINOPRIL 10 MG PO TABS: 10.0000 mg | ORAL_TABLET | Freq: Every day | ORAL | 1 refills | Status: DC

## 2019-09-17 NOTE — Assessment & Plan Note (Addendum)
Uncontrolled, will increase lisinopril.  Repeat labs in 1 week

## 2019-09-17 NOTE — Progress Notes (Signed)
Subjective:    Patient ID: Diamond Hill, female    DOB: 1951-01-18, 69 y.o.   MRN: [id number]  CC: Diamond Hill is a 69 y.o. female who presents today for follow up.   HPI: Feels well well today No complaints.   HTN- compliant with medications. At home, 138/80, 124/78. Denies exertional chest pain or pressure, numbness or tingling radiating to left arm or jaw, palpitations, dizziness, frequent headaches, changes in vision, or shortness of breath.   Diabetes- not taking metformin. Plans to restart.   H/o rectal cancer- never had follow up. Due colonoscopy  Allergic rhinitis-compliant with flonase, zyrtec. triggered by dust, pollen.  takes symbicort however not sure if has asthma. Occasional  cough. No wheezing on symbicort. No sob.  Has been using prn with relief for wheezing. Never smoker.    HISTORY:  Past Medical History:  Diagnosis Date   Blood in stool    Cancer [facility]) 2005   rectal, Dr. Claybon Jabs, Dr. Mallie Snooks, Dr. Leamon Arnt, s/p XRT   Colon polyp    Diverticulitis    Heart murmur    Hypertension    Past Surgical History:  Procedure Laterality Date   ABDOMINAL HYSTERECTOMY  1993   ovaries intact   rectal cancer  2005   removal of lesion   Family History  Problem Relation Age of Onset   Cancer Mother        colon   Hyperlipidemia Mother    Hypertension Mother    Hyperlipidemia Father    Hypertension Father    Diabetes Father    Heart disease Father    Hypertension Other    Breast cancer Neg Hx     Allergies: Patient has no known allergies. Current Outpatient Medications on File Prior to Visit  Medication Sig Dispense Refill   amLODipine (NORVASC) 10 MG tablet TAKE 1 TABLET BY MOUTH DAILY 90 tablet 1   b complex vitamins tablet Take 1 tablet by mouth daily.     budesonide-formoterol (SYMBICORT) 80-4.5 MCG/ACT inhaler Inhale 2 puffs into the lungs 2 (two) times daily. 1 Inhaler 3   fluticasone (FLONASE) 50 MCG/ACT nasal spray Place 2  sprays into both nostrils daily. 16 g 6   Multiple Vitamin (MULTIVITAMIN) tablet Take 1 tablet by mouth daily.     Vitamin D, Ergocalciferol, 2000 units CAPS Take by mouth daily at 6 (six) AM.     metFORMIN (GLUCOPHAGE XR) 500 MG 24 hr tablet Start 500mg  PO qpm. (Patient not taking: Reported on 09/17/2019) 90 tablet 3   No current facility-administered medications on file prior to visit.    Social History   Tobacco Use   Smoking status: Never Smoker   Smokeless tobacco: Never Used  Substance Use Topics   Alcohol use: Yes   Drug use: No    Review of Systems  Constitutional: Negative for chills and fever.  HENT: Positive for rhinorrhea. Negative for congestion.   Respiratory: Positive for cough and wheezing.   Cardiovascular: Negative for chest pain and palpitations.  Gastrointestinal: Negative for nausea and vomiting.      Objective:    BP 130/90    Pulse 91    Temp 97.9 F (36.6 C)    Ht 5' 4.02" (1.626 m)    Wt 196 lb 3.2 oz (89 kg)    SpO2 97%    BMI 33.66 kg/m  BP Readings from Last 3 Encounters:  09/17/19 130/90  03/20/18 120/90  01/16/18 128/88   Wt Readings from Last  3 Encounters:  09/17/19 196 lb 3.2 oz (89 kg)  03/20/18 180 lb (81.6 kg)  01/16/18 184 lb 6 oz (83.6 kg)    Physical Exam Vitals reviewed.  Constitutional:      Appearance: She is well-developed.  Eyes:     Conjunctiva/sclera: Conjunctivae normal.  Cardiovascular:     Rate and Rhythm: Normal rate and regular rhythm.     Pulses: Normal pulses.     Heart sounds: Normal heart sounds.  Pulmonary:     Effort: Pulmonary effort is normal.     Breath sounds: Examination of the right-upper field reveals wheezing. Examination of the left-upper field reveals wheezing. Wheezing present. No rhonchi or rales.     Comments: Few expiratory wheezes Skin:    General: Skin is warm and dry.  Neurological:     Mental Status: She is alert.  Psychiatric:        Speech: Speech normal.        Behavior:  Behavior normal.        Thought Content: Thought content normal.        Assessment & Plan:   Problem List Items Addressed This Visit      Cardiovascular and Mediastinum   Hypertension - Primary    Uncontrolled, will increase lisinopril.  Repeat labs in 1 week      Relevant Medications   lisinopril (ZESTRIL) 10 MG tablet   Other Relevant Orders   CBC with Differential/Platelet   Comprehensive metabolic panel   Hemoglobin A1c   Lipid panel   TSH   VITAMIN D 25 Hydroxy (Vit-D Deficiency, Fractures)     Endocrine   Diabetes mellitus without complication (HCC)    Pending A1c, anticipate uncontrolled.  Patient will resume Metformin.      Relevant Medications   lisinopril (ZESTRIL) 10 MG tablet   Other Relevant Orders   Microalbumin / creatinine urine ratio     Other   Wheezing    No acute respiratory distress, patient is well-appearing today on exam. Working diagnosis of asthma exacerbated by seasonal allergies; education provided on using symbicort DAILY. If no resolution of symptoms, will refer to pulmonology for formal evaluation.           I have discontinued Diamond Hill's albuterol and sertraline. I have also changed her lisinopril. Additionally, I am having her maintain her b complex vitamins, multivitamin, fluticasone, Vitamin D (Ergocalciferol), metFORMIN, budesonide-formoterol, and amLODipine.   Meds ordered this encounter  Medications   lisinopril (ZESTRIL) 10 MG tablet    Sig: Take 1 tablet (10 mg total) by mouth daily.    Dispense:  90 tablet    Refill:  1    Order Specific Question:   Supervising Provider    Answer:   Crecencio Mc [[id number]]    Return precautions given.   Risks, benefits, and alternatives of the medications and treatment plan prescribed today were discussed, and patient expressed understanding.   Education regarding symptom management and diagnosis given to patient on AVS.  Continue to follow with Burnard Hawthorne, FNP  for routine health maintenance.   Diamond Hill and I agreed with plan.   [name], FNP

## 2019-09-17 NOTE — Assessment & Plan Note (Signed)
Pending A1c, anticipate uncontrolled.  Patient will resume Metformin.

## 2019-09-17 NOTE — Assessment & Plan Note (Signed)
No acute respiratory distress, patient is well-appearing today on exam. Working diagnosis of asthma exacerbated by seasonal allergies; education provided on using symbicort DAILY. If no resolution of symptoms, will refer to pulmonology for formal evaluation.

## 2019-09-17 NOTE — Patient Instructions (Addendum)
Increase lisinopril to 10mg  once per day. Continue amlodipine 10mg  daily.   Use symbicort DAILY and see if wheezing completely resolves. Let me know.   Let me know if you need a referral for oncology as discussed today. You are also due for colonoscopy.   Czito, Antonieta Pert, MD  [facility] [address], [place] [address]  [phone]   Nice to see you!   Managing Your Hypertension Hypertension is commonly called high blood pressure. This is when the force of your blood pressing against the walls of your arteries is too strong. Arteries are blood vessels that carry blood from your heart throughout your body. Hypertension forces the heart to work harder to pump blood, and may cause the arteries to become narrow or stiff. Having untreated or uncontrolled hypertension can cause heart attack, stroke, kidney disease, and other problems. What are blood pressure readings? A blood pressure reading consists of a higher number over a lower number. Ideally, your blood pressure should be below 120/80. The first ("top") number is called the systolic pressure. It is a measure of the pressure in your arteries as your heart beats. The second ("bottom") number is called the diastolic pressure. It is a measure of the pressure in your arteries as the heart relaxes. What does my blood pressure reading mean? Blood pressure is classified into four stages. Based on your blood pressure reading, your health care provider may use the following stages to determine what type of treatment you need, if any. Systolic pressure and diastolic pressure are measured in a unit called mm Hg. Normal  Systolic pressure: below 416.  Diastolic pressure: below 80. Elevated  Systolic pressure: 384-536.  Diastolic pressure: below 80. Hypertension stage 1  Systolic pressure: 468-032.  Diastolic pressure: 12-24. Hypertension stage 2  Systolic pressure: 825 or above.  Diastolic pressure: 90 or above. What  health risks are associated with hypertension? Managing your hypertension is an important responsibility. Uncontrolled hypertension can lead to:  A heart attack.  A stroke.  A weakened blood vessel (aneurysm).  Heart failure.  Kidney damage.  Eye damage.  Metabolic syndrome.  Memory and concentration problems. What changes can I make to manage my hypertension? Hypertension can be managed by making lifestyle changes and possibly by taking medicines. Your health care provider will help you make a plan to bring your blood pressure within a normal range. Eating and drinking   Eat a diet that is high in fiber and potassium, and low in salt (sodium), added sugar, and fat. An example eating plan is called the DASH (Dietary Approaches to Stop Hypertension) diet. To eat this way: ? Eat plenty of fresh fruits and vegetables. Try to fill half of your plate at each meal with fruits and vegetables. ? Eat whole grains, such as whole wheat pasta, brown rice, or whole grain bread. Fill about one quarter of your plate with whole grains. ? Eat low-fat diary products. ? Avoid fatty cuts of meat, processed or cured meats, and poultry with skin. Fill about one quarter of your plate with lean proteins such as fish, chicken without skin, beans, eggs, and tofu. ? Avoid premade and processed foods. These tend to be higher in sodium, added sugar, and fat.  Reduce your daily sodium intake. Most people with hypertension should eat less than 1,500 mg of sodium a day.  Limit alcohol intake to no more than 1 drink a day for nonpregnant women and 2 drinks a day for men. One  drink equals 12 oz of beer, 5 oz of wine, or 1 oz of hard liquor. Lifestyle  Work with your health care provider to maintain a healthy body weight, or to lose weight. Ask what an ideal weight is for you.  Get at least 30 minutes of exercise that causes your heart to beat faster (aerobic exercise) most days of the week. Activities may  include walking, swimming, or biking.  Include exercise to strengthen your muscles (resistance exercise), such as weight lifting, as part of your weekly exercise routine. Try to do these types of exercises for 30 minutes at least 3 days a week.  Do not use any products that contain nicotine or tobacco, such as cigarettes and e-cigarettes. If you need help quitting, ask your health care provider.  Control any long-term (chronic) conditions you have, such as high cholesterol or diabetes. Monitoring  Monitor your blood pressure at home as told by your health care provider. Your personal target blood pressure may vary depending on your medical conditions, your age, and other factors.  Have your blood pressure checked regularly, as often as told by your health care provider. Working with your health care provider  Review all the medicines you take with your health care provider because there may be side effects or interactions.  Talk with your health care provider about your diet, exercise habits, and other lifestyle factors that may be contributing to hypertension.  Visit your health care provider regularly. Your health care provider can help you create and adjust your plan for managing hypertension. Will I need medicine to control my blood pressure? Your health care provider may prescribe medicine if lifestyle changes are not enough to get your blood pressure under control, and if:  Your systolic blood pressure is 130 or higher.  Your diastolic blood pressure is 80 or higher. Take medicines only as told by your health care provider. Follow the directions carefully. Blood pressure medicines must be taken as prescribed. The medicine does not work as well when you skip doses. Skipping doses also puts you at risk for problems. Contact a health care provider if:  You think you are having a reaction to medicines you have taken.  You have repeated (recurrent) headaches.  You feel dizzy.  You  have swelling in your ankles.  You have trouble with your vision. Get help right away if:  You develop a severe headache or confusion.  You have unusual weakness or numbness, or you feel faint.  You have severe pain in your chest or abdomen.  You vomit repeatedly.  You have trouble breathing. Summary  Hypertension is when the force of blood pumping through your arteries is too strong. If this condition is not controlled, it may put you at risk for serious complications.  Your personal target blood pressure may vary depending on your medical conditions, your age, and other factors. For most people, a normal blood pressure is less than 120/80.  Hypertension is managed by lifestyle changes, medicines, or both. Lifestyle changes include weight loss, eating a healthy, low-sodium diet, exercising more, and limiting alcohol. This information is not intended to replace advice given to you by your health care provider. Make sure you discuss any questions you have with your health care provider. Document Revised: 06/16/2018 Document Reviewed: 01/21/2016 Elsevier Patient Education  [place].

## 2019-09-19 ENCOUNTER — Other Ambulatory Visit: Payer: Self-pay | Admitting: Family

## 2019-09-19 DIAGNOSIS — Z1231 Encounter for screening mammogram for malignant neoplasm of breast: Secondary | ICD-10-CM

## 2019-09-25 ENCOUNTER — Other Ambulatory Visit (INDEPENDENT_AMBULATORY_CARE_PROVIDER_SITE_OTHER): Payer: Medicare HMO

## 2019-09-25 ENCOUNTER — Other Ambulatory Visit: Payer: Self-pay

## 2019-09-25 ENCOUNTER — Other Ambulatory Visit: Payer: Self-pay | Admitting: Family

## 2019-09-25 DIAGNOSIS — E119 Type 2 diabetes mellitus without complications: Secondary | ICD-10-CM | POA: Diagnosis not present

## 2019-09-25 DIAGNOSIS — I10 Essential (primary) hypertension: Secondary | ICD-10-CM

## 2019-09-25 DIAGNOSIS — Z85048 Personal history of other malignant neoplasm of rectum, rectosigmoid junction, and anus: Secondary | ICD-10-CM

## 2019-09-25 LAB — CBC WITH DIFFERENTIAL/PLATELET
Basophils Absolute: 0 10*3/uL (ref 0.0–0.1)
Basophils Relative: 0.7 % (ref 0.0–3.0)
Eosinophils Absolute: 0.3 10*3/uL (ref 0.0–0.7)
Eosinophils Relative: 4.4 % (ref 0.0–5.0)
HCT: 41.8 % (ref 36.0–46.0)
Hemoglobin: 14 g/dL (ref 12.0–15.0)
Lymphocytes Relative: 48.3 % — ABNORMAL HIGH (ref 12.0–46.0)
Lymphs Abs: 3.5 10*3/uL (ref 0.7–4.0)
MCHC: 33.4 g/dL (ref 30.0–36.0)
MCV: 92 fl (ref 78.0–100.0)
Monocytes Absolute: 0.6 10*3/uL (ref 0.1–1.0)
Monocytes Relative: 8 % (ref 3.0–12.0)
Neutro Abs: 2.8 10*3/uL (ref 1.4–7.7)
Neutrophils Relative %: 38.6 % — ABNORMAL LOW (ref 43.0–77.0)
Platelets: 276 10*3/uL (ref 150.0–400.0)
RBC: 4.54 Mil/uL (ref 3.87–5.11)
RDW: 14.3 % (ref 11.5–15.5)
WBC: 7.3 10*3/uL (ref 4.0–10.5)

## 2019-09-25 LAB — COMPREHENSIVE METABOLIC PANEL
ALT: 15 U/L (ref 0–35)
AST: 13 U/L (ref 0–37)
Albumin: 4.2 g/dL (ref 3.5–5.2)
Alkaline Phosphatase: 57 U/L (ref 39–117)
BUN: 14 mg/dL (ref 6–23)
CO2: 30 mEq/L (ref 19–32)
Calcium: 9.2 mg/dL (ref 8.4–10.5)
Chloride: 106 mEq/L (ref 96–112)
Creatinine, Ser: 0.82 mg/dL (ref 0.40–1.20)
GFR: 84.13 mL/min (ref >60.00)
Glucose, Bld: 118 mg/dL — ABNORMAL HIGH (ref 70–99)
Potassium: 3.7 mEq/L (ref 3.5–5.1)
Sodium: 142 mEq/L (ref 135–145)
Total Bilirubin: 0.4 mg/dL (ref 0.2–1.2)
Total Protein: 7.2 g/dL (ref 6.0–8.3)

## 2019-09-25 LAB — VITAMIN D 25 HYDROXY (VIT D DEFICIENCY, FRACTURES): VITD: 27.98 ng/mL — ABNORMAL LOW (ref 30.00–100.00)

## 2019-09-25 LAB — LIPID PANEL
Cholesterol: 181 mg/dL (ref 0–200)
HDL: 87.5 mg/dL (ref >39.00)
LDL Cholesterol: 81 mg/dL (ref 0–99)
NonHDL: 93.82
Total CHOL/HDL Ratio: 2
Triglycerides: 64 mg/dL (ref 0.0–149.0)
VLDL: 12.8 mg/dL (ref 0.0–40.0)

## 2019-09-25 LAB — HEMOGLOBIN A1C: Hgb A1c MFr Bld: 6.4 % (ref 4.6–6.5)

## 2019-09-25 LAB — MICROALBUMIN / CREATININE URINE RATIO
Creatinine,U: 167.6 mg/dL
Microalb Creat Ratio: 1 mg/g (ref 0.0–30.0)
Microalb, Ur: 1.7 mg/dL (ref 0.0–1.9)

## 2019-09-25 LAB — TSH: TSH: 2.74 u[IU]/mL (ref 0.35–4.50)

## 2019-09-28 ENCOUNTER — Telehealth: Payer: Self-pay | Admitting: Family

## 2019-09-28 NOTE — Telephone Encounter (Signed)
Pt called to get lab results °

## 2019-10-09 ENCOUNTER — Ambulatory Visit
Admission: RE | Admit: 2019-10-09 | Discharge: 2019-10-09 | Disposition: A | Discharge status: Home / Self Care | Payer: Medicare HMO | Source: Ambulatory Visit | Attending: Family | Admitting: Family

## 2019-10-09 ENCOUNTER — Other Ambulatory Visit: Payer: Self-pay

## 2019-10-09 DIAGNOSIS — Z1231 Encounter for screening mammogram for malignant neoplasm of breast: Secondary | ICD-10-CM | POA: Diagnosis not present

## 2019-11-24 ENCOUNTER — Other Ambulatory Visit: Payer: Self-pay | Admitting: Family

## 2019-11-24 DIAGNOSIS — I10 Essential (primary) hypertension: Secondary | ICD-10-CM

## 2019-12-19 ENCOUNTER — Ambulatory Visit (INDEPENDENT_AMBULATORY_CARE_PROVIDER_SITE_OTHER): Payer: Medicare HMO | Admitting: Family

## 2019-12-19 ENCOUNTER — Encounter: Payer: Self-pay | Admitting: Family

## 2019-12-19 ENCOUNTER — Other Ambulatory Visit: Payer: Self-pay

## 2019-12-19 VITALS — BP 172/90 | HR 83 | Temp 98.3°F | Ht 63.25 in | Wt 195.4 lb

## 2019-12-19 DIAGNOSIS — F419 Anxiety disorder, unspecified: Secondary | ICD-10-CM | POA: Diagnosis not present

## 2019-12-19 DIAGNOSIS — J309 Allergic rhinitis, unspecified: Secondary | ICD-10-CM | POA: Diagnosis not present

## 2019-12-19 DIAGNOSIS — E559 Vitamin D deficiency, unspecified: Secondary | ICD-10-CM | POA: Diagnosis not present

## 2019-12-19 DIAGNOSIS — R69 Illness, unspecified: Secondary | ICD-10-CM | POA: Diagnosis not present

## 2019-12-19 DIAGNOSIS — C20 Malignant neoplasm of rectum: Secondary | ICD-10-CM | POA: Diagnosis not present

## 2019-12-19 DIAGNOSIS — E119 Type 2 diabetes mellitus without complications: Secondary | ICD-10-CM | POA: Diagnosis not present

## 2019-12-19 DIAGNOSIS — Z0000 Encounter for general adult medical examination without abnormal findings: Secondary | ICD-10-CM | POA: Diagnosis not present

## 2019-12-19 DIAGNOSIS — I10 Essential (primary) hypertension: Secondary | ICD-10-CM | POA: Diagnosis not present

## 2019-12-19 DIAGNOSIS — F32A Depression, unspecified: Secondary | ICD-10-CM | POA: Diagnosis not present

## 2019-12-19 DIAGNOSIS — Z13820 Encounter for screening for osteoporosis: Secondary | ICD-10-CM | POA: Diagnosis not present

## 2019-12-19 MED ORDER — FLUTICASONE PROPIONATE 50 MCG/ACT NA SUSP: 2.0000 | Freq: Every day | NASAL | 6 refills | Status: DC

## 2019-12-19 MED ORDER — ESCITALOPRAM OXALATE 10 MG PO TABS: 10.0000 mg | ORAL_TABLET | Freq: Every day | ORAL | 1 refills | Status: DC

## 2019-12-19 MED ORDER — LISINOPRIL 20 MG PO TABS: 20.0000 mg | ORAL_TABLET | Freq: Every day | ORAL | 1 refills | Status: DC

## 2019-12-19 MED ORDER — BUDESONIDE-FORMOTEROL FUMARATE 80-4.5 MCG/ACT IN AERO: 2.0000 | INHALATION_SPRAY | Freq: Two times a day (BID) | RESPIRATORY_TRACT | 1 refills | Status: DC

## 2019-12-19 NOTE — Assessment & Plan Note (Signed)
Follow up next week with oncology. Will folloow

## 2019-12-19 NOTE — Assessment & Plan Note (Addendum)
Declines CBE today. Deferred pelvic exam in the absence of complaints and pap smear UTD.patient will schedule DEXA.  We wil schedule Nurse visit for prevnar 13 . Encouraged to start walking program.

## 2019-12-19 NOTE — Assessment & Plan Note (Signed)
Worsened of late during pandemic. She has done well on lexapro in the past. Start lexapro 10mg  with close follow up.

## 2019-12-19 NOTE — Assessment & Plan Note (Signed)
Elevated quite a bit above normal. Suspect weight gain, anxiety contributory. Will increase lisinopril to 20mg ; maintain amlodipine 10mg 

## 2019-12-19 NOTE — Assessment & Plan Note (Signed)
Symptoms controlled. Continue flonase, symbicort 80-45 mcg.

## 2019-12-19 NOTE — Assessment & Plan Note (Signed)
Pre diabetic at this time. She declines using metformin to help with weight loss. Will watch a1c closely.

## 2019-12-19 NOTE — Progress Notes (Signed)
Was able to catch patient in office & number to [facility] given to pt.

## 2019-12-19 NOTE — Progress Notes (Signed)
Subjective:    Patient ID: Diamond Hill, female    DOB: 1951/03/05, 69 y.o.   MRN: [id number]  CC: Diamond Hill is a 69 y.o. female who presents today for physical exam, follow up chronic care.    HPI: Complains of worsening anxiety, depression and trouble sleeping for the past year since onset of pandemic. Worries about daughter, family . Separated by husband. No si/hi.  Has retired.  Was on lexapro years ago and 'felt relaxed' while on the medication.   HTN- feels stress and anxiety raising blood pressure. Taking amlodipine, lisinopril. No sob, cp, HA, vision changes. At home BP 150/80.   Pre- DM- never started the metformin as wants to focus on diet. Breakfast is oatmeal, sausage, apple juice. Lunch may skip or may eat one meal per day. Dinner may be peanut butter sandwich. Doesn't want to cook.   Never been diagnosed with asthma. Wheezing resolved with  Symbicort regularly.  Wheezing triggered by weather.    Colorectal Cancer Screening: due; h/o rectal cancer and seeing oncologist Dr Claybon Jabs, next week to discuss colonoscopy.  Breast Cancer Screening: Mammogram UTD Cervical Cancer Screening: pap UTD 2019. h/o hysterectomy ovaries intact; hysterectomy for non cancercous reasons, due to fibroid.  Bone Health screening/DEXA for 65+:Due Lung Cancer Screening: Doesn't have 30 year pack year history and age > 2 years yo 60 years   no family history of AAA.         Tetanus - utd        Pneumococcal - Candidate for prevnar 13  Labs: Screening labs today. Exercise: No regular exercise.   Alcohol use:  occasional wine Smoking/tobacco use: Nonsmoker.     HISTORY:  Past Medical History:  Diagnosis Date  . Blood in stool   . Cancer [facility]) 2005   rectal, Dr. Claybon Jabs, Dr. Mallie Snooks, Dr. Leamon Arnt, s/p XRT  . Colon polyp   . Diverticulitis   . Heart murmur   . Hypertension     Past Surgical History:  Procedure Laterality Date  . ABDOMINAL HYSTERECTOMY  1993   HAS CERVIX; ovaries  intact; hysterectomy for non cancercous reasons, due to fibroid.   . rectal cancer  2005   removal of lesion   Family History  Problem Relation Age of Onset  . Cancer Mother        colon  . Hyperlipidemia Mother   . Hypertension Mother   . Hyperlipidemia Father   . Hypertension Father   . Diabetes Father   . Heart disease Father   . Hypertension Other   . Breast cancer Neg Hx       ALLERGIES: Patient has no known allergies.  Current Outpatient Medications on File Prior to Visit  Medication Sig Dispense Refill  . amLODipine (NORVASC) 10 MG tablet TAKE 1 TABLET BY MOUTH DAILY 90 tablet 1  . Cholecalciferol (VITAMIN D3) 125 MCG (5000 UT) CAPS Take 1 capsule by mouth daily.    . fish oil-omega-3 fatty acids 1000 MG capsule Take 2 g by mouth daily.    . Multiple Vitamin (MULTIVITAMIN) tablet Take 1 tablet by mouth daily.     No current facility-administered medications on file prior to visit.    Social History   Tobacco Use  . Smoking status: Never Smoker  . Smokeless tobacco: Never Used  Substance Use Topics  . Alcohol use: Yes    Comment: occasional wine  . Drug use: No    Review of Systems  Constitutional: Negative for chills,  fever and unexpected weight change.  HENT: Negative for congestion.   Respiratory: Negative for cough and wheezing (controlled).   Cardiovascular: Negative for chest pain, palpitations and leg swelling.  Gastrointestinal: Negative for nausea and vomiting.  Genitourinary: Negative for pelvic pain.  Musculoskeletal: Negative for arthralgias and myalgias.  Skin: Negative for rash.  Neurological: Negative for headaches.  Hematological: Negative for adenopathy.  Psychiatric/Behavioral: Positive for sleep disturbance. Negative for confusion and suicidal ideas. The patient is nervous/anxious.       Objective:    BP (!) 172/90   Pulse 83   Temp 98.3 F (36.8 C)   Ht 5' 3.25" (1.607 m)   Wt 195 lb 6.4 oz (88.6 kg)   SpO2 96%   BMI 34.34  kg/m   BP Readings from Last 3 Encounters:  12/19/19 (!) 172/90  09/17/19 130/90  03/20/18 120/90   Wt Readings from Last 3 Encounters:  12/19/19 195 lb 6.4 oz (88.6 kg)  09/17/19 196 lb 3.2 oz (89 kg)  03/20/18 180 lb (81.6 kg)    Physical Exam Vitals reviewed.  Constitutional:      Appearance: She is well-developed.  Eyes:     Conjunctiva/sclera: Conjunctivae normal.  Neck:     Thyroid: No thyroid mass or thyromegaly.  Cardiovascular:     Rate and Rhythm: Normal rate and regular rhythm.     Pulses: Normal pulses.     Heart sounds: Normal heart sounds.  Pulmonary:     Effort: Pulmonary effort is normal.     Breath sounds: Normal breath sounds. No wheezing, rhonchi or rales.  Lymphadenopathy:     Head:     Right side of head: No submental, submandibular, tonsillar, preauricular, posterior auricular or occipital adenopathy.     Left side of head: No submental, submandibular, tonsillar, preauricular, posterior auricular or occipital adenopathy.     Cervical: No cervical adenopathy.  Skin:    General: Skin is warm and dry.  Neurological:     Mental Status: She is alert.  Psychiatric:        Speech: Speech normal.        Behavior: Behavior normal.        Thought Content: Thought content normal.        Assessment & Plan:   Problem List Items Addressed This Visit      Cardiovascular and Mediastinum   Hypertension    Elevated quite a bit above normal. Suspect weight gain, anxiety contributory. Will increase lisinopril to 20mg ; maintain amlodipine 10mg       Relevant Medications   lisinopril (ZESTRIL) 20 MG tablet     Respiratory   Allergic rhinitis    Symptoms controlled. Continue flonase, symbicort 80-45 mcg.       Relevant Medications   budesonide-formoterol (SYMBICORT) 80-4.5 MCG/ACT inhaler   fluticasone (FLONASE) 50 MCG/ACT nasal spray     Digestive   Rectal adenocarcinoma (HCC)    Follow up next week with oncology. Will folloow        Endocrine     Diabetes mellitus without complication ([place])    Pre diabetic at this time. She declines using metformin to help with weight loss. Will watch a1c closely.      Relevant Medications   lisinopril (ZESTRIL) 20 MG tablet     Other   Anxiety and depression    Worsened of late during pandemic. She has done well on lexapro in the past. Start lexapro 10mg  with close follow up.  Relevant Medications   escitalopram (LEXAPRO) 10 MG tablet   Routine general medical examination at a health care facility - Primary    Declines CBE today. Deferred pelvic exam in the absence of complaints and pap smear UTD.patient will schedule DEXA.  We wil schedule Nurse visit for prevnar 13 . Encouraged to start walking program.       Relevant Orders   DG Bone Density    Other Visit Diagnoses    Essential hypertension       Relevant Medications   lisinopril (ZESTRIL) 20 MG tablet   Other Relevant Orders   Basic metabolic panel   Screening for osteoporosis       Relevant Orders   DG Bone Density   Vitamin D deficiency       Relevant Orders   VITAMIN D 25 Hydroxy (Vit-D Deficiency, Fractures)       I have discontinued Emylee W. Senters's b complex vitamins, Vitamin D (Ergocalciferol), metFORMIN, and lisinopril. I have also changed her lisinopril. Additionally, I am having her start on escitalopram. Lastly, I am having her maintain her multivitamin, amLODipine, Vitamin D3, fish oil-omega-3 fatty acids, budesonide-formoterol, and fluticasone.   Meds ordered this encounter  Medications  . escitalopram (LEXAPRO) 10 MG tablet    Sig: Take 1 tablet (10 mg total) by mouth daily.    Dispense:  90 tablet    Refill:  1    Order Specific Question:   Supervising Provider    Answer:   Deborra Medina L [[id number]]  . lisinopril (ZESTRIL) 20 MG tablet    Sig: Take 1 tablet (20 mg total) by mouth daily.    Dispense:  90 tablet    Refill:  1    Order Specific Question:   Supervising Provider    Answer:   Deborra Medina L [[id number]]  . budesonide-formoterol (SYMBICORT) 80-4.5 MCG/ACT inhaler    Sig: Inhale 2 puffs into the lungs 2 (two) times daily.    Dispense:  2 each    Refill:  1    Patient needs follow-up appointment for refills.    Order Specific Question:   Supervising Provider    Answer:   Crecencio Mc [[id number]]  . fluticasone (FLONASE) 50 MCG/ACT nasal spray    Sig: Place 2 sprays into both nostrils daily.    Dispense:  16 g    Refill:  6    Order Specific Question:   Supervising Provider    Answer:   Crecencio Mc [[id number]]    Return precautions given.   Risks, benefits, and alternatives of the medications and treatment plan prescribed today were discussed, and patient expressed understanding.   Education regarding symptom management and diagnosis given to patient on AVS.   Continue to follow with Burnard Hawthorne, FNP for routine health maintenance.   Diamond Hill and I agreed with plan.   [name], FNP

## 2019-12-19 NOTE — Patient Instructions (Addendum)
Increase lisinopril to 20mg  total once per day  Labs in one week   Monitor blood pressure at home and me 5-6 reading on separate days. Goal is less than 120/80, based on newest guidelines, however we certainly want to be less than 130/80;  if persistently higher, please make sooner follow up appointment so we can recheck you blood pressure and manage/ adjust medications.  Start lexapro Essentials for good sleep:   #1 Exercise #2 Limit Caffeine ( no caffeine after lunch) #3 No smart phones, TV prior to bed -- BLUE light is VERY activating and send the brain an 'awake message.'  #4 Go to bed at same time of night each night and get up at same time of day.  #5 Take 0.5 to 5mg  melatonin at 7pm with dinner -this is when natural melatonin will start to increase  No more fruit juice!    This is  Dr. Lupita Dawn  example of a  "Low GI"  Diet:  It will allow you to lose 4 to 8  lbs  per month if you follow it carefully.  Your goal with exercise is a minimum of 30 minutes of aerobic exercise 5 days per week (Walking does not count once it becomes easy!)    All of the foods can be found at grocery stores and in bulk at [employer].  The Atkins protein bars and shakes are available in more varieties at [employer], [employer] and [place].     7 AM Breakfast:  Choose from the following:  Low carbohydrate Protein  Shakes (I recommend the  Premier Protein chocolate shakes,  EAS AdvantEdge "Carb Control" shakes  Or the Atkins shakes all are under 3 net carbs)     a scrambled egg/bacon/cheese burrito made with [employer]'s "carb balance" whole wheat tortilla  (about 10 net carbs )  Regulatory affairs officer (basically a quiche without the pastry crust) that is eaten cold and very convenient way to get your eggs.  8 carbs)  If you make your own protein shakes, avoid bananas and pineapple,  And use low carb greek yogurt or original /unsweetened almond or soy milk    Avoid cereal and bananas, oatmeal and  cream of wheat and grits. They are loaded with carbohydrates!   10 AM: high protein snack:  Protein bar by Atkins (the snack size, under 200 cal, usually < 6 net carbs).    A stick of cheese:  Around 1 carb,  100 cal     Dannon Light n Fit [place] Yogurt  (80 cal, 8 carbs)  Other so called "protein bars" and Greek yogurts tend to be loaded with carbohydrates.  Remember, in food advertising, the word "energy" is synonymous for " carbohydrate."  Lunch:   A Sandwich using the bread choices listed, Can use any  Eggs,  lunchmeat, grilled meat or canned tuna), avocado, regular mayo/mustard  and cheese.  A Salad using blue cheese, ranch,  Goddess or vinagrette,  Avoid taco shells, croutons or "confetti" and no "candied nuts" but regular nuts OK.   No pretzels, nabs  or chips.  Pickles and miniature sweet peppers are a good low carb alternative that provide a "crunch"  The bread is the only source of carbohydrate in a sandwich and  can be decreased by trying some of the attached alternatives to traditional loaf bread   Avoid "Low fat dressings, as well as Barry Brunner and [employer] dressings They are loaded with sugar!   3  PM/ Mid day  Snack:  Consider  1 ounce of  almonds, walnuts, pistachios, pecans, peanuts,  Macadamia nuts or a nut medley.  Avoid "granola and granola bars "  Mixed nuts are ok in moderation as long as there are no raisins,  cranberries or dried fruit.   KIND bars are OK if you get the low glycemic index variety   Try the prosciutto/mozzarella cheese sticks by [employer]  In deli /backery section   High protein      6 PM  Dinner:     Meat/fowl/fish with a green salad, and either broccoli, cauliflower, green beans, spinach, brussel sprouts or  Lima beans. DO NOT BREAD THE PROTEIN!!      There is a low carb pasta by Dreamfield's that is acceptable and tastes great: only 5 digestible carbs/serving.( All grocery stores but BJs carry it ) Several ready made meals are available low  carb:   Try Michel Angelo's chicken piccata or chicken or eggplant parm over low carb pasta.(Lowes and BJs)   Marjory Lies Sanchez's "Carnitas" (pulled pork, no sauce,  0 carbs) or his beef pot roast to make a dinner burrito (at [employer]J's)  Pesto over low carb pasta (bj's sells a good quality pesto in the center refrigerated section of the deli   Try satueeing  Cheral Marker with mushroooms as a good side   [employer] Giant makes a mashed cauliflower that tastes like mashed potatoes  Whole wheat pasta is still full of digestible carbs and  Not as low in glycemic index as Dreamfield's.   Brown rice is still rice,  So skip the rice and noodles if you eat [place] or [place] (or at least limit to 1/2 cup)  9 PM snack :   Breyer's "low carb" fudgsicle or  ice cream bar (Carb Smart line), or  Weight Watcher's ice cream bar , or another "no sugar added" ice cream;  a serving of fresh berries/cherries with whipped cream   Cheese or DANNON'S LlGHT N FIT GREEK YOGURT  8 ounces of [place] unsweetened almond/cococunut milk    Treat yourself to a parfait made with whipped cream blueberiies, walnuts and vanilla greek yogurt  Avoid bananas, pineapple, grapes  and watermelon on a regular basis because they are high in sugar.  THINK OF THEM AS DESSERT  Remember that snack Substitutions should be less than 10 NET carbs per serving and meals < 20 carbs. Remember to subtract fiber grams to get the "net carbs."    Health Maintenance for Postmenopausal Women Menopause is a normal process in which your ability to get pregnant comes to an end. This process happens slowly over many months or years, usually between the ages of 53 and 13. Menopause is complete when you have missed your menstrual periods for 12 months. It is important to talk with your health care provider about some of the most common conditions that affect women after menopause (postmenopausal women). These include heart disease, cancer, and bone loss (osteoporosis).  Adopting a healthy lifestyle and getting preventive care can help to promote your health and wellness. The actions you take can also lower your chances of developing some of these common conditions. What should I know about menopause? During menopause, you may get a number of symptoms, such as:  Hot flashes. These can be moderate or severe.  Night sweats.  Decrease in sex drive.  Mood swings.  Headaches.  Tiredness.  Irritability.  Memory problems.  Insomnia. Choosing to treat or not to  treat these symptoms is a decision that you make with your health care provider. Do I need hormone replacement therapy?  Hormone replacement therapy is effective in treating symptoms that are caused by menopause, such as hot flashes and night sweats.  Hormone replacement carries certain risks, especially as you become older. If you are thinking about using estrogen or estrogen with progestin, discuss the benefits and risks with your health care provider. What is my risk for heart disease and stroke? The risk of heart disease, heart attack, and stroke increases as you age. One of the causes may be a change in the body's hormones during menopause. This can affect how your body uses dietary fats, triglycerides, and cholesterol. Heart attack and stroke are medical emergencies. There are many things that you can do to help prevent heart disease and stroke. Watch your blood pressure  High blood pressure causes heart disease and increases the risk of stroke. This is more likely to develop in people who have high blood pressure readings, are of African descent, or are overweight.  Have your blood pressure checked: ? Every 3-5 years if you are 32-13 years of age. ? Every year if you are 40 years old or older. Eat a healthy diet   Eat a diet that includes plenty of vegetables, fruits, low-fat dairy products, and lean protein.  Do not eat a lot of foods that are high in solid fats, added sugars, or  sodium. Get regular exercise Get regular exercise. This is one of the most important things you can do for your health. Most adults should:  Try to exercise for at least 150 minutes each week. The exercise should increase your heart rate and make you sweat (moderate-intensity exercise).  Try to do strengthening exercises at least twice each week. Do these in addition to the moderate-intensity exercise.  Spend less time sitting. Even light physical activity can be beneficial. Other tips  Work with your health care provider to achieve or maintain a healthy weight.  Do not use any products that contain nicotine or tobacco, such as cigarettes, e-cigarettes, and chewing tobacco. If you need help quitting, ask your health care provider.  Know your numbers. Ask your health care provider to check your cholesterol and your blood sugar (glucose). Continue to have your blood tested as directed by your health care provider. Do I need screening for cancer? Depending on your health history and family history, you may need to have cancer screening at different stages of your life. This may include screening for:  Breast cancer.  Cervical cancer.  Lung cancer.  Colorectal cancer. What is my risk for osteoporosis? After menopause, you may be at increased risk for osteoporosis. Osteoporosis is a condition in which bone destruction happens more quickly than new bone creation. To help prevent osteoporosis or the bone fractures that can happen because of osteoporosis, you may take the following actions:  If you are 54-38 years old, get at least 1,000 mg of calcium and at least 600 mg of vitamin D per day.  If you are older than age 69 but younger than age 71, get at least 1,200 mg of calcium and at least 600 mg of vitamin D per day.  If you are older than age 81, get at least 1,200 mg of calcium and at least 800 mg of vitamin D per day. Smoking and drinking excessive alcohol increase the risk of  osteoporosis. Eat foods that are rich in calcium and vitamin D, and do weight-bearing  exercises several times each week as directed by your health care provider. How does menopause affect my mental health? Depression may occur at any age, but it is more common as you become older. Common symptoms of depression include:  Low or sad mood.  Changes in sleep patterns.  Changes in appetite or eating patterns.  Feeling an overall lack of motivation or enjoyment of activities that you previously enjoyed.  Frequent crying spells. Talk with your health care provider if you think that you are experiencing depression. General instructions See your health care provider for regular wellness exams and vaccines. This may include:  Scheduling regular health, dental, and eye exams.  Getting and maintaining your vaccines. These include: ? Influenza vaccine. Get this vaccine each year before the flu season begins. ? Pneumonia vaccine. ? Shingles vaccine. ? Tetanus, diphtheria, and pertussis (Tdap) booster vaccine. Your health care provider may also recommend other immunizations. Tell your health care provider if you have ever been abused or do not feel safe at home. Summary  Menopause is a normal process in which your ability to get pregnant comes to an end.  This condition causes hot flashes, night sweats, decreased interest in sex, mood swings, headaches, or lack of sleep.  Treatment for this condition may include hormone replacement therapy.  Take actions to keep yourself healthy, including exercising regularly, eating a healthy diet, watching your weight, and checking your blood pressure and blood sugar levels.  Get screened for cancer and depression. Make sure that you are up to date with all your vaccines. This information is not intended to replace advice given to you by your health care provider. Make sure you discuss any questions you have with your health care provider. Document Revised:  02/15/2018 Document Reviewed: 02/15/2018 Elsevier Patient Education  2020 [employer].

## 2019-12-21 ENCOUNTER — Other Ambulatory Visit: Payer: Self-pay | Admitting: Family

## 2019-12-21 DIAGNOSIS — R062 Wheezing: Secondary | ICD-10-CM

## 2019-12-21 MED ORDER — BUDESONIDE-FORMOTEROL FUMARATE 80-4.5 MCG/ACT IN AERO: 2.0000 | INHALATION_SPRAY | Freq: Two times a day (BID) | RESPIRATORY_TRACT | 12 refills | Status: DC

## 2019-12-25 DIAGNOSIS — C20 Malignant neoplasm of rectum: Secondary | ICD-10-CM | POA: Diagnosis not present

## 2019-12-26 ENCOUNTER — Other Ambulatory Visit: Payer: Medicare HMO

## 2019-12-26 ENCOUNTER — Ambulatory Visit: Payer: Medicare HMO

## 2020-01-18 ENCOUNTER — Encounter: Payer: Self-pay | Admitting: Family

## 2020-01-30 ENCOUNTER — Encounter: Payer: Self-pay | Admitting: Family

## 2020-01-30 ENCOUNTER — Ambulatory Visit (INDEPENDENT_AMBULATORY_CARE_PROVIDER_SITE_OTHER): Payer: Medicare HMO | Admitting: Family

## 2020-01-30 ENCOUNTER — Other Ambulatory Visit: Payer: Self-pay

## 2020-01-30 VITALS — BP 160/92 | HR 79 | Temp 98.1°F | Ht 63.0 in | Wt 193.2 lb

## 2020-01-30 DIAGNOSIS — F419 Anxiety disorder, unspecified: Secondary | ICD-10-CM | POA: Diagnosis not present

## 2020-01-30 DIAGNOSIS — I10 Essential (primary) hypertension: Secondary | ICD-10-CM | POA: Diagnosis not present

## 2020-01-30 DIAGNOSIS — E119 Type 2 diabetes mellitus without complications: Secondary | ICD-10-CM | POA: Diagnosis not present

## 2020-01-30 DIAGNOSIS — F32A Depression, unspecified: Secondary | ICD-10-CM

## 2020-01-30 DIAGNOSIS — R69 Illness, unspecified: Secondary | ICD-10-CM | POA: Diagnosis not present

## 2020-01-30 LAB — BASIC METABOLIC PANEL
BUN: 13 mg/dL (ref 6–23)
CO2: 30 mEq/L (ref 19–32)
Calcium: 9.4 mg/dL (ref 8.4–10.5)
Chloride: 103 mEq/L (ref 96–112)
Creatinine, Ser: 0.83 mg/dL (ref 0.40–1.20)
GFR: 72.96 mL/min (ref >60.00)
Glucose, Bld: 98 mg/dL (ref 70–99)
Potassium: 3.5 mEq/L (ref 3.5–5.1)
Sodium: 141 mEq/L (ref 135–145)

## 2020-01-30 LAB — HEMOGLOBIN A1C: Hgb A1c MFr Bld: 6.5 % (ref 4.6–6.5)

## 2020-01-30 NOTE — Assessment & Plan Note (Signed)
Uncontrolled. Patient declines starting another antihypertensive such as HCTZ in addition to amlodipine 10mg . We agreed to retrial lisinopril 10mg  BID to see if divided doses causes leg swelling. Close follow up.

## 2020-01-30 NOTE — Progress Notes (Signed)
Subjective:    Patient ID: Diamond Hill, female    DOB: 12/21/1950, 69 y.o.   MRN: [id number]  CC: Diamond Hill is a 69 y.o. female who presents today for follow up.   HPI: HTN- started taking lisinopril 20mg  and noted ankles were swollen, resolved since resumed 10mg  lisinopril. Not salting foods. Compliant amlodipine 10mg .   Starting doing arm and leg exercises at home. Starting to walk video at home. No cp, sob.   Depression- Never started lexapro as she wanted to make life changes and 'divorce myself' of problems and work on being there for herself. Sleeping better.     Dr Raylene Everts scheduld colonoscopy 03/2020   HISTORY:  Past Medical History:  Diagnosis Date  . Blood in stool   . Cancer [facility]) 2005   rectal, Dr. Claybon Jabs, Dr. Mallie Snooks, Dr. Leamon Arnt, s/p XRT  . Colon polyp   . Diverticulitis   . Heart murmur   . Hypertension    Past Surgical History:  Procedure Laterality Date  . ABDOMINAL HYSTERECTOMY  1993   HAS CERVIX; ovaries intact; hysterectomy for non cancercous reasons, due to fibroid.   . rectal cancer  2005   removal of lesion   Family History  Problem Relation Age of Onset  . Cancer Mother        colon  . Hyperlipidemia Mother   . Hypertension Mother   . Hyperlipidemia Father   . Hypertension Father   . Diabetes Father   . Heart disease Father   . Hypertension Other   . Breast cancer Neg Hx     Allergies: Patient has no known allergies. Current Outpatient Medications on File Prior to Visit  Medication Sig Dispense Refill  . amLODipine (NORVASC) 10 MG tablet TAKE 1 TABLET BY MOUTH DAILY 90 tablet 1  . budesonide-formoterol (SYMBICORT) 80-4.5 MCG/ACT inhaler Inhale 2 puffs into the lungs in the morning and at bedtime. 1 each 12  . Cholecalciferol (VITAMIN D3) 125 MCG (5000 UT) CAPS Take 1 capsule by mouth daily.    . fish oil-omega-3 fatty acids 1000 MG capsule Take 2 g by mouth daily.    . fluticasone (FLONASE) 50 MCG/ACT nasal spray Place 2 sprays  into both nostrils daily. 16 g 6  . lisinopril (ZESTRIL) 20 MG tablet Take 1 tablet (20 mg total) by mouth daily. 90 tablet 1  . Multiple Vitamin (MULTIVITAMIN) tablet Take 1 tablet by mouth daily.     No current facility-administered medications on file prior to visit.    Social History   Tobacco Use  . Smoking status: Never Smoker  . Smokeless tobacco: Never Used  Substance Use Topics  . Alcohol use: Yes    Comment: occasional wine  . Drug use: No    Review of Systems  Constitutional: Negative for chills and fever.  Respiratory: Negative for cough and shortness of breath.   Cardiovascular: Negative for chest pain, palpitations and leg swelling (resolved).  Gastrointestinal: Negative for nausea and vomiting.  Psychiatric/Behavioral: Negative for sleep disturbance. The patient is not nervous/anxious.       Objective:    BP (!) 160/92   Pulse 79   Temp 98.1 F (36.7 C)   Ht 5\' 3"  (1.6 m)   Wt 193 lb 3.2 oz (87.6 kg)   SpO2 99%   BMI 34.22 kg/m  BP Readings from Last 3 Encounters:  01/30/20 (!) 160/92  12/19/19 (!) 172/90  09/17/19 130/90   Wt Readings from Last 3 Encounters:  01/30/20 193 lb 3.2 oz (87.6 kg)  12/19/19 195 lb 6.4 oz (88.6 kg)  09/17/19 196 lb 3.2 oz (89 kg)    Physical Exam Vitals reviewed.  Constitutional:      Appearance: She is well-developed.  Eyes:     Conjunctiva/sclera: Conjunctivae normal.  Cardiovascular:     Rate and Rhythm: Normal rate and regular rhythm.     Pulses: Normal pulses.     Heart sounds: Normal heart sounds.  Pulmonary:     Effort: Pulmonary effort is normal.     Breath sounds: Normal breath sounds. No wheezing, rhonchi or rales.  Musculoskeletal:     Right lower leg: No edema.     Left lower leg: No edema.  Skin:    General: Skin is warm and dry.  Neurological:     Mental Status: She is alert.  Psychiatric:        Speech: Speech normal.        Behavior: Behavior normal.        Thought Content: Thought  content normal.        Assessment & Plan:   Problem List Items Addressed This Visit      Cardiovascular and Mediastinum   Hypertension    Uncontrolled. Patient declines starting another antihypertensive such as HCTZ in addition to amlodipine 10mg . We agreed to retrial lisinopril 10mg  BID to see if divided doses causes leg swelling. Close follow up.         Endocrine   Diabetes mellitus without complication ([place]) - Primary   Relevant Orders   Hemoglobin A1c     Other   Anxiety and depression    Improved. Patient declines medication at this time. Lexapro discontinued.        Other Visit Diagnoses    Essential hypertension       Relevant Orders   Basic metabolic panel       I have discontinued Diamond Hill's escitalopram. I am also having her maintain her multivitamin, amLODipine, Vitamin D3, fish oil-omega-3 fatty acids, lisinopril, fluticasone, and budesonide-formoterol.   No orders of the defined types were placed in this encounter.   Return precautions given.   Risks, benefits, and alternatives of the medications and treatment plan prescribed today were discussed, and patient expressed understanding.   Education regarding symptom management and diagnosis given to patient on AVS.  Continue to follow with Diamond Hawthorne, FNP for routine health maintenance.   Diamond Hill and I agreed with plan.   Diamond Paris, FNP

## 2020-01-30 NOTE — Patient Instructions (Signed)
Labs today and again in one week  Lisinopril 10mg  twice per day to see if legs swell.    Happy Thanksgiving!

## 2020-01-30 NOTE — Assessment & Plan Note (Signed)
Improved. Patient declines medication at this time. Lexapro discontinued.

## 2020-02-04 ENCOUNTER — Other Ambulatory Visit: Payer: Self-pay

## 2020-02-04 MED ORDER — METFORMIN HCL ER 500 MG PO TB24: 500.0000 mg | ORAL_TABLET | Freq: Every evening | ORAL | 1 refills | Status: DC

## 2020-02-06 ENCOUNTER — Other Ambulatory Visit: Payer: Self-pay

## 2020-02-06 ENCOUNTER — Other Ambulatory Visit (INDEPENDENT_AMBULATORY_CARE_PROVIDER_SITE_OTHER): Payer: Medicare HMO

## 2020-02-06 DIAGNOSIS — I10 Essential (primary) hypertension: Secondary | ICD-10-CM

## 2020-02-06 DIAGNOSIS — E559 Vitamin D deficiency, unspecified: Secondary | ICD-10-CM

## 2020-02-06 LAB — BASIC METABOLIC PANEL
BUN: 13 mg/dL (ref 6–23)
CO2: 30 mEq/L (ref 19–32)
Calcium: 9.5 mg/dL (ref 8.4–10.5)
Chloride: 104 mEq/L (ref 96–112)
Creatinine, Ser: 0.89 mg/dL (ref 0.40–1.20)
GFR: 67.09 mL/min (ref >60.00)
Glucose, Bld: 108 mg/dL — ABNORMAL HIGH (ref 70–99)
Potassium: 4 mEq/L (ref 3.5–5.1)
Sodium: 142 mEq/L (ref 135–145)

## 2020-02-06 LAB — VITAMIN D 25 HYDROXY (VIT D DEFICIENCY, FRACTURES): VITD: 39.72 ng/mL (ref 30.00–100.00)

## 2020-02-28 ENCOUNTER — Other Ambulatory Visit: Payer: Self-pay | Admitting: Family

## 2020-02-28 DIAGNOSIS — I10 Essential (primary) hypertension: Secondary | ICD-10-CM

## 2020-03-12 ENCOUNTER — Ambulatory Visit: Payer: Medicare HMO | Admitting: Family

## 2020-03-13 ENCOUNTER — Ambulatory Visit (INDEPENDENT_AMBULATORY_CARE_PROVIDER_SITE_OTHER): Payer: Medicare HMO | Admitting: Family

## 2020-03-13 ENCOUNTER — Other Ambulatory Visit: Payer: Self-pay

## 2020-03-13 VITALS — BP 160/100 | HR 83 | Temp 97.6°F | Resp 16 | Ht 63.0 in | Wt 191.0 lb

## 2020-03-13 DIAGNOSIS — E119 Type 2 diabetes mellitus without complications: Secondary | ICD-10-CM | POA: Diagnosis not present

## 2020-03-13 DIAGNOSIS — I10 Essential (primary) hypertension: Secondary | ICD-10-CM

## 2020-03-13 NOTE — Assessment & Plan Note (Signed)
Lab Results  Component Value Date   HGBA1C 6.5 01/30/2020   Controlled. She prefers not to start metformin and focus on lifestyle. Close follow up.

## 2020-03-13 NOTE — Patient Instructions (Signed)
Nice to see you!  It is imperative that you are seen AT least twice per year for labs and monitoring. Monitor blood pressure at home and me 5-6 reading on separate days. Goal is less than 120/80, based on newest guidelines, however we certainly want to be less than 130/80;  if persistently higher, please make sooner follow up appointment so we can recheck you blood pressure and manage/ adjust medications.   Carbohydrate Counting for Diabetes Mellitus, Adult  Carbohydrate counting is a method of keeping track of how many carbohydrates you eat. Eating carbohydrates naturally increases the amount of sugar (glucose) in the blood. Counting how many carbohydrates you eat helps keep your blood glucose within normal limits, which helps you manage your diabetes (diabetes mellitus). It is important to know how many carbohydrates you can safely have in each meal. This is different for every person. A diet and nutrition specialist (registered dietitian) can help you make a meal plan and calculate how many carbohydrates you should have at each meal and snack. Carbohydrates are found in the following foods:  Grains, such as breads and cereals.  Dried beans and soy products.  Starchy vegetables, such as potatoes, peas, and corn.  Fruit and fruit juices.  Milk and yogurt.  Sweets and snack foods, such as cake, cookies, candy, chips, and soft drinks. How do I count carbohydrates? There are two ways to count carbohydrates in food. You can use either of the methods or a combination of both. Reading "Nutrition Facts" on packaged food The "Nutrition Facts" list is included on the labels of almost all packaged foods and beverages in the [place] It includes:  The serving size.  Information about nutrients in each serving, including the grams (g) of carbohydrate per serving. To use the "Nutrition Facts":  Decide how many servings you will have.  Multiply the number of servings by the number of carbohydrates  per serving.  The resulting number is the total amount of carbohydrates that you will be having. Learning standard serving sizes of other foods When you eat carbohydrate foods that are not packaged or do not include "Nutrition Facts" on the label, you need to measure the servings in order to count the amount of carbohydrates:  Measure the foods that you will eat with a food scale or measuring cup, if needed.  Decide how many standard-size servings you will eat.  Multiply the number of servings by 15. Most carbohydrate-rich foods have about 15 g of carbohydrates per serving. ? For example, if you eat 8 oz (170 g) of strawberries, you will have eaten 2 servings and 30 g of carbohydrates (2 servings x 15 g = 30 g).  For foods that have more than one food mixed, such as soups and casseroles, you must count the carbohydrates in each food that is included. The following list contains standard serving sizes of common carbohydrate-rich foods. Each of these servings has about 15 g of carbohydrates:   hamburger bun or  English muffin.   oz (15 mL) syrup.   oz (14 g) jelly.  1 slice of bread.  1 six-inch tortilla.  3 oz (85 g) cooked rice or pasta.  4 oz (113 g) cooked dried beans.  4 oz (113 g) starchy vegetable, such as peas, corn, or potatoes.  4 oz (113 g) hot cereal.  4 oz (113 g) mashed potatoes or  of a large baked potato.  4 oz (113 g) canned or frozen fruit.  4 oz (120 mL) fruit  juice.  4-6 crackers.  6 chicken nuggets.  6 oz (170 g) unsweetened dry cereal.  6 oz (170 g) plain fat-free yogurt or yogurt sweetened with artificial sweeteners.  8 oz (240 mL) milk.  8 oz (170 g) fresh fruit or one small piece of fruit.  24 oz (680 g) popped popcorn. Example of carbohydrate counting Sample meal  3 oz (85 g) chicken breast.  6 oz (170 g) brown rice.  4 oz (113 g) corn.  8 oz (240 mL) milk.  8 oz (170 g) strawberries with sugar-free whipped  topping. Carbohydrate calculation 1. Identify the foods that contain carbohydrates: ? Rice. ? Corn. ? Milk. ? Strawberries. 2. Calculate how many servings you have of each food: ? 2 servings rice. ? 1 serving corn. ? 1 serving milk. ? 1 serving strawberries. 3. Multiply each number of servings by 15 g: ? 2 servings rice x 15 g = 30 g. ? 1 serving corn x 15 g = 15 g. ? 1 serving milk x 15 g = 15 g. ? 1 serving strawberries x 15 g = 15 g. 4. Add together all of the amounts to find the total grams of carbohydrates eaten: ? 30 g + 15 g + 15 g + 15 g = 75 g of carbohydrates total. Summary  Carbohydrate counting is a method of keeping track of how many carbohydrates you eat.  Eating carbohydrates naturally increases the amount of sugar (glucose) in the blood.  Counting how many carbohydrates you eat helps keep your blood glucose within normal limits, which helps you manage your diabetes.  A diet and nutrition specialist (registered dietitian) can help you make a meal plan and calculate how many carbohydrates you should have at each meal and snack. This information is not intended to replace advice given to you by your health care provider. Make sure you discuss any questions you have with your health care provider. Document Revised: 09/16/2016 Document Reviewed: 08/06/2015 Elsevier Patient Education  [place].

## 2020-03-13 NOTE — Progress Notes (Signed)
Subjective:    Patient ID: Diamond Hill, female    DOB: Jun 06, 1950, 70 y.o.   MRN: [id number]  CC: Diamond Hill is a 70 y.o. female who presents today for follow up.   HPI: Feels well today No complaints  DM- patient never started metformin. She would like to focus on diet.   HTN- compliant with amlodipine 10mg , lisinopril 10mg  qd; never increased lisinopril 10mg  BID. At home BP 130/80. She is actively working on loosing weight. No nsaid use.  No cp, sob.  Leg swelling resolved. She suspects leg swelling from when she was on higher dose of lisinopril.   Due colonoscopy-scheduled with Dr 03/31/20       HISTORY:  Past Medical History:  Diagnosis Date  . Blood in stool   . Cancer [facility]) 2005   rectal, Dr. 04/02/20, Dr. [facility], Dr. 2006, s/p XRT  . Colon polyp   . Diverticulitis   . Heart murmur   . Hypertension    Past Surgical History:  Procedure Laterality Date  . ABDOMINAL HYSTERECTOMY  1993   HAS CERVIX; ovaries intact; hysterectomy for non cancercous reasons, due to fibroid.   . rectal cancer  2005   removal of lesion   Family History  Problem Relation Age of Onset  . Cancer Mother        colon  . Hyperlipidemia Mother   . Hypertension Mother   . Hyperlipidemia Father   . Hypertension Father   . Diabetes Father   . Heart disease Father   . Hypertension Other   . Breast cancer Neg Hx     Allergies: Patient has no known allergies. Current Outpatient Medications on File Prior to Visit  Medication Sig Dispense Refill  . amLODipine (NORVASC) 10 MG tablet TAKE 1 TABLET BY MOUTH DAILY 90 tablet 1  . budesonide-formoterol (SYMBICORT) 80-4.5 MCG/ACT inhaler Inhale 2 puffs into the lungs in the morning and at bedtime. 1 each 12  . Cholecalciferol (VITAMIN D3) 125 MCG (5000 UT) CAPS Take 1 capsule by mouth daily.    . fish oil-omega-3 fatty acids 1000 MG capsule Take 2 g by mouth daily.    . fluticasone (FLONASE) 50 MCG/ACT nasal spray Place 2 sprays  into both nostrils daily. 16 g 6  . lisinopril (ZESTRIL) 10 MG tablet TAKE 1 TABLET(10 MG) BY MOUTH DAILY 90 tablet 1  . Multiple Vitamin (MULTIVITAMIN) tablet Take 1 tablet by mouth daily.     No current facility-administered medications on file prior to visit.    Social History   Tobacco Use  . Smoking status: Never Smoker  . Smokeless tobacco: Never Used  Substance Use Topics  . Alcohol use: Yes    Comment: occasional wine  . Drug use: No    Review of Systems  Constitutional: Negative for chills and fever.  Respiratory: Negative for cough.   Cardiovascular: Negative for chest pain, palpitations and leg swelling.  Gastrointestinal: Negative for nausea and vomiting.      Objective:    BP (!) 160/100   Pulse 83   Temp 97.6 F (36.4 C) (Oral)   Resp 16   Ht 5\' 3"  (1.6 m)   Wt 191 lb (86.6 kg)   SpO2 99%   BMI 33.83 kg/m  BP Readings from Last 3 Encounters:  03/13/20 (!) 160/100  01/30/20 (!) 160/92  12/19/19 (!) 172/90   Wt Readings from Last 3 Encounters:  03/13/20 191 lb (86.6 kg)  01/30/20 193 lb 3.2 oz (  87.6 kg)  12/19/19 195 lb 6.4 oz (88.6 kg)    Physical Exam Vitals reviewed.  Constitutional:      Appearance: She is well-developed and well-nourished.  Eyes:     Conjunctiva/sclera: Conjunctivae normal.  Cardiovascular:     Rate and Rhythm: Normal rate and regular rhythm.     Pulses: Normal pulses.     Heart sounds: Normal heart sounds.  Pulmonary:     Effort: Pulmonary effort is normal.     Breath sounds: Normal breath sounds. No wheezing, rhonchi or rales.  Skin:    General: Skin is warm and dry.  Neurological:     Mental Status: She is alert.  Psychiatric:        Mood and Affect: Mood and affect normal.        Speech: Speech normal.        Behavior: Behavior normal.        Thought Content: Thought content normal.        Assessment & Plan:   Problem List Items Addressed This Visit      Cardiovascular and Mediastinum   Hypertension -  Primary    Uncontrolled. Advised patient of newest BP guidelines < 120/80 and to increase lisinopril to 10mg  BID and monitor for sleeping. She declines as BP readings at home are better controlled. Patient would like to aggressively work on weight loss and bring BP cuff with her to appointment 04/29/20.close follow up.       Relevant Orders   Lipid panel   Comprehensive metabolic panel     Endocrine   Diabetes mellitus without complication [facility])    Lab Results  Component Value Date   HGBA1C 6.5 01/30/2020   Controlled. She prefers not to start metformin and focus on lifestyle. Close follow up.          I have discontinued Diamond Hill's metFORMIN. I am also having her maintain her multivitamin, amLODipine, Vitamin D3, fish oil-omega-3 fatty acids, fluticasone, budesonide-formoterol, and lisinopril.   No orders of the defined types were placed in this encounter.   Return precautions given.   Risks, benefits, and alternatives of the medications and treatment plan prescribed today were discussed, and patient expressed understanding.   Education regarding symptom management and diagnosis given to patient on AVS.  Continue to follow with Diamond Hawthorne, FNP for routine health maintenance.   Diamond Hill and I agreed with plan.   Diamond Paris, FNP

## 2020-03-13 NOTE — Assessment & Plan Note (Signed)
Uncontrolled. Advised patient of newest BP guidelines < 120/80 and to increase lisinopril to 10mg  BID and monitor for sleeping. She declines as BP readings at home are better controlled. Patient would like to aggressively work on weight loss and bring BP cuff with her to appointment 04/29/20.close follow up.

## 2020-04-18 ENCOUNTER — Ambulatory Visit (INDEPENDENT_AMBULATORY_CARE_PROVIDER_SITE_OTHER): Payer: Medicare HMO

## 2020-04-18 VITALS — Ht 63.0 in | Wt 191.0 lb

## 2020-04-18 DIAGNOSIS — Z0000 Encounter for general adult medical examination without abnormal findings: Secondary | ICD-10-CM | POA: Diagnosis not present

## 2020-04-18 NOTE — Patient Instructions (Addendum)
Diamond Hill , Thank you for taking time to come for your Medicare Wellness Visit. I appreciate your ongoing commitment to your health goals. Please review the following plan we discussed and let me know if I can assist you in the future.   These are the goals we discussed: Goals      Patient Stated   .  DIET - REDUCE SUGAR INTAKE (pt-stated)      Portion control       This is a list of the screening recommended for you and due dates:  Health Maintenance  Topic Date Due  . Complete foot exam   Never done  . Eye exam for diabetics  Never done  . Colon Cancer Screening  12/07/2015  . DEXA scan (bone density measurement)  04/18/2021*  . Pneumonia vaccines (1 of 2 - PCV13) 04/18/2021*  . Hemoglobin A1C  07/29/2020  . Mammogram  10/08/2021  . Tetanus Vaccine  10/13/2022  . COVID-19 Vaccine  Completed  .  Hepatitis C: One time screening is recommended by Center for Disease Control  (CDC) for  adults born from [birth year] through 1963.   Completed  . Flu Shot  Discontinued  *Topic was postponed. The date shown is not the original due date.    Immunizations Immunization History  Administered Date(s) Administered  . Influenza Split 12/06/2013  . Influenza-Unspecified 12/25/2012  . Moderna Sars-Covid-2 Vaccination 05/05/2019, 05/26/2019, 06/02/2019, 02/28/2020  . Tdap 10/12/2012   Follow up 04/29/20 @ 9:00  Advanced directives: not yet completed  Conditions/risks identified: none new  Follow up in one year for your annual wellness visit    Preventive Care 65 Years and Older, Female Preventive care refers to lifestyle choices and visits with your health care provider that can promote health and wellness. What does preventive care include?  A yearly physical exam. This is also called an annual well check.  Dental exams once or twice a year.  Routine eye exams. Ask your health care provider how often you should have your eyes checked.  Personal lifestyle choices,  including:  Daily care of your teeth and gums.  Regular physical activity.  Eating a healthy diet.  Avoiding tobacco and drug use.  Limiting alcohol use.  Practicing safe sex.  Taking low-dose aspirin every day.  Taking vitamin and mineral supplements as recommended by your health care provider. What happens during an annual well check? The services and screenings done by your health care provider during your annual well check will depend on your age, overall health, lifestyle risk factors, and family history of disease. Counseling  Your health care provider may ask you questions about your:  Alcohol use.  Tobacco use.  Drug use.  Emotional well-being.  Home and relationship well-being.  Sexual activity.  Eating habits.  History of falls.  Memory and ability to understand (cognition).  Work and work Statistician.  Reproductive health. Screening  You may have the following tests or measurements:  Height, weight, and BMI.  Blood pressure.  Lipid and cholesterol levels. These may be checked every 5 years, or more frequently if you are over 72 years old.  Skin check.  Lung cancer screening. You may have this screening every year starting at age 8 if you have a 30-pack-year history of smoking and currently smoke or have quit within the past 15 years.  Fecal occult blood test (FOBT) of the stool. You may have this test every year starting at age 73.  Flexible sigmoidoscopy or colonoscopy. You may  have a sigmoidoscopy every 5 years or a colonoscopy every 10 years starting at age 60.  Hepatitis C blood test.  Hepatitis B blood test.  Sexually transmitted disease (STD) testing.  Diabetes screening. This is done by checking your blood sugar (glucose) after you have not eaten for a while (fasting). You may have this done every 1-3 years.  Bone density scan. This is done to screen for osteoporosis. You may have this done starting at age 74.  Mammogram. This  may be done every 1-2 years. Talk to your health care provider about how often you should have regular mammograms. Talk with your health care provider about your test results, treatment options, and if necessary, the need for more tests. Vaccines  Your health care provider may recommend certain vaccines, such as:  Influenza vaccine. This is recommended every year.  Tetanus, diphtheria, and acellular pertussis (Tdap, Td) vaccine. You may need a Td booster every 10 years.  Zoster vaccine. You may need this after age 1.  Pneumococcal 13-valent conjugate (PCV13) vaccine. One dose is recommended after age 23.  Pneumococcal polysaccharide (PPSV23) vaccine. One dose is recommended after age 92. Talk to your health care provider about which screenings and vaccines you need and how often you need them. This information is not intended to replace advice given to you by your health care provider. Make sure you discuss any questions you have with your health care provider. Document Released: 03/21/2015 Document Revised: 11/12/2015 Document Reviewed: 12/24/2014 Elsevier Interactive Patient Education  2017 [employer] Prevention in the Home Falls can cause injuries. They can happen to people of all ages. There are many things you can do to make your home safe and to help prevent falls. What can I do on the outside of my home?  Regularly fix the edges of walkways and driveways and fix any cracks.  Remove anything that might make you trip as you walk through a door, such as a raised step or threshold.  Trim any bushes or trees on the path to your home.  Use bright outdoor lighting.  Clear any walking paths of anything that might make someone trip, such as rocks or tools.  Regularly check to see if handrails are loose or broken. Make sure that both sides of any steps have handrails.  Any raised decks and porches should have guardrails on the edges.  Have any leaves, snow, or ice cleared  regularly.  Use sand or salt on walking paths during winter.  Clean up any spills in your garage right away. This includes oil or grease spills. What can I do in the bathroom?  Use night lights.  Install grab bars by the toilet and in the tub and shower. Do not use towel bars as grab bars.  Use non-skid mats or decals in the tub or shower.  If you need to sit down in the shower, use a plastic, non-slip stool.  Keep the floor dry. Clean up any water that spills on the floor as soon as it happens.  Remove soap buildup in the tub or shower regularly.  Attach bath mats securely with double-sided non-slip rug tape.  Do not have throw rugs and other things on the floor that can make you trip. What can I do in the bedroom?  Use night lights.  Make sure that you have a light by your bed that is easy to reach.  Do not use any sheets or blankets that are too big for your  bed. They should not hang down onto the floor.  Have a firm chair that has side arms. You can use this for support while you get dressed.  Do not have throw rugs and other things on the floor that can make you trip. What can I do in the kitchen?  Clean up any spills right away.  Avoid walking on wet floors.  Keep items that you use a lot in easy-to-reach places.  If you need to reach something above you, use a strong step stool that has a grab bar.  Keep electrical cords out of the way.  Do not use floor polish or wax that makes floors slippery. If you must use wax, use non-skid floor wax.  Do not have throw rugs and other things on the floor that can make you trip. What can I do with my stairs?  Do not leave any items on the stairs.  Make sure that there are handrails on both sides of the stairs and use them. Fix handrails that are broken or loose. Make sure that handrails are as long as the stairways.  Check any carpeting to make sure that it is firmly attached to the stairs. Fix any carpet that is loose  or worn.  Avoid having throw rugs at the top or bottom of the stairs. If you do have throw rugs, attach them to the floor with carpet tape.  Make sure that you have a light switch at the top of the stairs and the bottom of the stairs. If you do not have them, ask someone to add them for you. What else can I do to help prevent falls?  Wear shoes that:  Do not have high heels.  Have rubber bottoms.  Are comfortable and fit you well.  Are closed at the toe. Do not wear sandals.  If you use a stepladder:  Make sure that it is fully opened. Do not climb a closed stepladder.  Make sure that both sides of the stepladder are locked into place.  Ask someone to hold it for you, if possible.  Clearly mark and make sure that you can see:  Any grab bars or handrails.  First and last steps.  Where the edge of each step is.  Use tools that help you move around (mobility aids) if they are needed. These include:  Canes.  Walkers.  Scooters.  Crutches.  Turn on the lights when you go into a dark area. Replace any light bulbs as soon as they burn out.  Set up your furniture so you have a clear path. Avoid moving your furniture around.  If any of your floors are uneven, fix them.  If there are any pets around you, be aware of where they are.  Review your medicines with your doctor. Some medicines can make you feel dizzy. This can increase your chance of falling. Ask your doctor what other things that you can do to help prevent falls. This information is not intended to replace advice given to you by your health care provider. Make sure you discuss any questions you have with your health care provider. Document Released: 12/19/2008 Document Revised: 07/31/2015 Document Reviewed: 03/29/2014 Elsevier Interactive Patient Education  2017 [employer].

## 2020-04-18 NOTE — Progress Notes (Addendum)
Subjective:   Diamond Hill is a 70 y.o. female who presents for an Initial Medicare Annual Wellness Visit.  Review of Systems    No ROS.  Medicare Wellness Virtual Visit.    Cardiac Risk Factors include: advanced age (>53men, >80 women);hypertension;diabetes mellitus     Objective:    Today's Vitals   04/18/20 1404  Weight: 191 lb (86.6 kg)  Height: 5\' 3"  (1.6 m)   Body mass index is 33.83 kg/m.  Advanced Directives 04/18/2020  Does Patient Have a Medical Advance Directive? No  Would patient like information on creating a medical advance directive? No - Patient declined    Current Medications (verified) Outpatient Encounter Medications as of 04/18/2020  Medication Sig  . amLODipine (NORVASC) 10 MG tablet TAKE 1 TABLET BY MOUTH DAILY  . budesonide-formoterol (SYMBICORT) 80-4.5 MCG/ACT inhaler Inhale 2 puffs into the lungs in the morning and at bedtime.  . Cholecalciferol (VITAMIN D3) 125 MCG (5000 UT) CAPS Take 1 capsule by mouth daily.  . fish oil-omega-3 fatty acids 1000 MG capsule Take 2 g by mouth daily.  . fluticasone (FLONASE) 50 MCG/ACT nasal spray Place 2 sprays into both nostrils daily.  Marland Kitchen lisinopril (ZESTRIL) 10 MG tablet TAKE 1 TABLET(10 MG) BY MOUTH DAILY  . Multiple Vitamin (MULTIVITAMIN) tablet Take 1 tablet by mouth daily.   No facility-administered encounter medications on file as of 04/18/2020.    Allergies (verified) Patient has no known allergies.   History: Past Medical History:  Diagnosis Date  . Blood in stool   . Cancer [facility]) 2005   rectal, Dr. Claybon Jabs, Dr. Mallie Snooks, Dr. Leamon Arnt, s/p XRT  . Colon polyp   . Diverticulitis   . Heart murmur   . Hypertension    Past Surgical History:  Procedure Laterality Date  . ABDOMINAL HYSTERECTOMY  1993   HAS CERVIX; ovaries intact; hysterectomy for non cancercous reasons, due to fibroid.   . rectal cancer  2005   removal of lesion   Family History  Problem Relation Age of Onset  . Cancer Mother         colon  . Hyperlipidemia Mother   . Hypertension Mother   . Hyperlipidemia Father   . Hypertension Father   . Diabetes Father   . Heart disease Father   . Hypertension Other   . Breast cancer Neg Hx    Social History   Socioeconomic History  . Marital status: Divorced    Spouse name: Not on file  . Number of children: Not on file  . Years of education: Not on file  . Highest education level: Not on file  Occupational History  . Not on file  Tobacco Use  . Smoking status: Never Smoker  . Smokeless tobacco: Never Used  Substance and Sexual Activity  . Alcohol use: Yes    Comment: occasional wine  . Drug use: No  . Sexual activity: Not on file  Other Topics Concern  . Not on file  Social History Narrative   Lives in [place]. Separate from husband. 2 children.      Work - retired   Diet - Healthy   Exercise - none regular   Social Determinants of Radio broadcast assistant Strain: Low Risk   . Difficulty of Paying Living Expenses: Not hard at all  Food Insecurity: No Food Insecurity  . Worried About Charity fundraiser in the Last Year: Never true  . Ran Out of Food in the Last Year:  Never true  Transportation Needs: No Transportation Needs  . Lack of Transportation (Medical): No  . Lack of Transportation (Non-Medical): No  Physical Activity: Unknown  . Days of Exercise per Week: 0 days  . Minutes of Exercise per Session: Not on file  Stress: No Stress Concern Present  . Feeling of Stress : Not at all  Social Connections: Not on file    Tobacco Counseling Counseling given: Not Answered   Clinical Intake:  Pre-visit preparation completed: Yes           How often do you need to have someone help you when you read instructions, pamphlets, or other written materials from your doctor or pharmacy?: 1 - Never   Interpreter Needed?: No      Activities of Daily Living In your present state of health, do you have any difficulty performing the  following activities: 04/18/2020  Hearing? N  Vision? N  Difficulty concentrating or making decisions? N  Walking or climbing stairs? N  Dressing or bathing? N  Doing errands, shopping? N  Preparing Food and eating ? N  Using the Toilet? N  In the past six months, have you accidently leaked urine? N  Do you have problems with loss of bowel control? N  Managing your Medications? N  Managing your Finances? N  Housekeeping or managing your Housekeeping? N  Some recent data might be hidden    Patient Care Team: Burnard Hawthorne, FNP as PCP - General (Family Medicine)  Indicate any recent Medical Services you may have received from other than [facility] providers in the past year (date may be approximate).     Assessment:   This is a routine wellness examination for [name].  I connected with Diamond Hill today by telephone and verified that I am speaking with the correct person using two identifiers. Location patient: home Location provider: work Persons participating in the virtual visit: patient, Marine scientist.    I discussed the limitations, risks, security and privacy concerns of performing an evaluation and management service by telephone and the availability of in person appointments. The patient expressed understanding and verbally consented to this telephonic visit.    Interactive audio and video telecommunications were attempted between this provider and patient, however failed, due to patient having technical difficulties OR patient did not have access to video capability.  We continued and completed visit with audio only.  Some vital signs may be absent or patient reported.   Hearing/Vision screen  Hearing Screening   125Hz  250Hz  500Hz  1000Hz  2000Hz  3000Hz  4000Hz  6000Hz  8000Hz   Right ear:           Left ear:           Comments: Patient is able to hear conversational tones without difficulty.  No issues reported.   Vision Screening Comments: Followed by Dr. Ellin Mayhew Wears corrective  lenses Visual acuity not assessed, virtual visit.       Dietary issues and exercise activities discussed: Current Exercise Habits: Home exercise routine  Regular diet; diet controlled diabetes ([place]). Plans to reduce sugar in take. Good water intake  Goals      Patient Stated   .  DIET - REDUCE SUGAR INTAKE (pt-stated)      Portion control      Depression Screen PHQ 2/9 Scores 04/18/2020 01/30/2020 12/19/2019 09/17/2019 03/20/2018 01/16/2018 09/16/2015  PHQ - 2 Score 0 1 4 0 1 1 0  PHQ- 9 Score - 4 12 - 2 7 -    Fall Risk  Fall Risk  04/18/2020 09/17/2019 12/02/2017 09/16/2015 10/12/2012  Falls in the past year? 0 0 No No No  Number falls in past yr: 0 0 - - -  Injury with Fall? 0 0 - - -  Follow up Falls evaluation completed Falls evaluation completed - - -    FALL RISK PREVENTION PERTAINING TO THE HOME: Handrails in use when climbing stairs? Yes Home free of loose throw rugs in walkways, pet beds, electrical cords, etc? Yes  Adequate lighting in your home to reduce risk of falls? Yes   ASSISTIVE DEVICES UTILIZED TO PREVENT FALLS: Use of a cane, walker or w/c? No  Grab bars in the bathroom? No  Shower chair or bench in shower? No  Elevated toilet seat or a handicapped toilet? No   TIMED UP AND GO: Was the test performed? No . Virtual visit.   Cognitive Function:  Patient is alert and oriented x3.  Declines difficulty focusing, making decisions, memory loss.  Enjoys reading, puzzles and other brain challenging activities.   MMSE/6CIT deferred. Normal by directed communication/observation.     Immunizations Immunization History  Administered Date(s) Administered  . Influenza Split 12/06/2013  . Influenza-Unspecified 12/25/2012  . Moderna Sars-Covid-2 Vaccination 05/05/2019, 05/26/2019, 06/02/2019, 02/28/2020  . Tdap 10/12/2012   Eye exam- plans to schedule later in the season.  Foot exam- followed by pcp  Health Maintenance Health Maintenance  Topic Date Due  .  FOOT EXAM  Never done  . OPHTHALMOLOGY EXAM  Never done  . COLONOSCOPY (Pts 45-66yrs Insurance coverage will need to be confirmed)  12/07/2015  . DEXA SCAN  04/18/2021 (Originally 09/07/2017)  . PNA vac Low Risk Adult (1 of 2 - PCV13) 04/18/2021 (Originally 09/07/2017)  . HEMOGLOBIN A1C  07/29/2020  . MAMMOGRAM  10/08/2021  . TETANUS/TDAP  10/13/2022  . COVID-19 Vaccine  Completed  . Hepatitis C Screening  Completed  . INFLUENZA VACCINE  Discontinued   Colorectal cancer screening: Type of screening: Colonoscopy. Completed 12/07/10. Repeat every 5 years. Scheduled 07/2020.   Mammogram status: Completed 10/09/19. Repeat every year  Bone density- plans to schedule with next mammogram.   Lung Cancer Screening: (Low Dose CT Chest recommended if Age 29-80 years, 30 pack-year currently smoking OR have quit w/in 15years.) does not qualify.   Hepatitis C Screening: Completed 09/16/15.   Vision Screening: Recommended annual ophthalmology exams for early detection of glaucoma and other disorders of the eye. Is the patient up to date with their annual eye exam?  Yes  Who is the provider or what is the name of the office in which the patient attends annual eye exams? Dr. Ellin Mayhew.  Dental Screening: Recommended annual dental exams for proper oral hygiene.  Community Resource Referral / Chronic Care Management: CRR required this visit?  No   CCM required this visit?  No      Plan:   Keep all routine maintenance appointments.   Follow up 04/29/20 @ 9:00  I have personally reviewed and noted the following in the patient's chart:   . Medical and social history . Use of alcohol, tobacco or illicit drugs  . Current medications and supplements-patient not on opiods.  . Functional ability and status . Nutritional status . Physical activity . Advanced directives . List of other physicians . Hospitalizations, surgeries, and ER visits in previous 12 months . Vitals . Screenings to include cognitive,  depression, and falls . Referrals and appointments  In addition, I have reviewed and discussed with patient certain preventive  protocols, quality metrics, and best practice recommendations. A written personalized care plan for preventive services as well as general preventive health recommendations were provided to patient via mychart.     Varney Biles, LPN   7/57/3225     Agree with plan. [name], NP

## 2020-04-29 ENCOUNTER — Ambulatory Visit: Payer: Medicare HMO | Admitting: Family

## 2020-05-08 DIAGNOSIS — M9904 Segmental and somatic dysfunction of sacral region: Secondary | ICD-10-CM | POA: Diagnosis not present

## 2020-05-08 DIAGNOSIS — M5137 Other intervertebral disc degeneration, lumbosacral region: Secondary | ICD-10-CM | POA: Diagnosis not present

## 2020-05-08 DIAGNOSIS — M5451 Vertebrogenic low back pain: Secondary | ICD-10-CM | POA: Diagnosis not present

## 2020-05-08 DIAGNOSIS — M9903 Segmental and somatic dysfunction of lumbar region: Secondary | ICD-10-CM | POA: Diagnosis not present

## 2020-05-08 DIAGNOSIS — M7918 Myalgia, other site: Secondary | ICD-10-CM | POA: Diagnosis not present

## 2020-05-08 DIAGNOSIS — M5441 Lumbago with sciatica, right side: Secondary | ICD-10-CM | POA: Diagnosis not present

## 2020-05-09 DIAGNOSIS — M7918 Myalgia, other site: Secondary | ICD-10-CM | POA: Diagnosis not present

## 2020-05-09 DIAGNOSIS — M9904 Segmental and somatic dysfunction of sacral region: Secondary | ICD-10-CM | POA: Diagnosis not present

## 2020-05-09 DIAGNOSIS — M5451 Vertebrogenic low back pain: Secondary | ICD-10-CM | POA: Diagnosis not present

## 2020-05-09 DIAGNOSIS — M9903 Segmental and somatic dysfunction of lumbar region: Secondary | ICD-10-CM | POA: Diagnosis not present

## 2020-05-09 DIAGNOSIS — M5137 Other intervertebral disc degeneration, lumbosacral region: Secondary | ICD-10-CM | POA: Diagnosis not present

## 2020-05-09 DIAGNOSIS — M5441 Lumbago with sciatica, right side: Secondary | ICD-10-CM | POA: Diagnosis not present

## 2020-05-13 DIAGNOSIS — M5441 Lumbago with sciatica, right side: Secondary | ICD-10-CM | POA: Diagnosis not present

## 2020-05-13 DIAGNOSIS — M7918 Myalgia, other site: Secondary | ICD-10-CM | POA: Diagnosis not present

## 2020-05-13 DIAGNOSIS — M5451 Vertebrogenic low back pain: Secondary | ICD-10-CM | POA: Diagnosis not present

## 2020-05-13 DIAGNOSIS — M9904 Segmental and somatic dysfunction of sacral region: Secondary | ICD-10-CM | POA: Diagnosis not present

## 2020-05-13 DIAGNOSIS — M5137 Other intervertebral disc degeneration, lumbosacral region: Secondary | ICD-10-CM | POA: Diagnosis not present

## 2020-05-13 DIAGNOSIS — M9903 Segmental and somatic dysfunction of lumbar region: Secondary | ICD-10-CM | POA: Diagnosis not present

## 2020-05-14 DIAGNOSIS — M7918 Myalgia, other site: Secondary | ICD-10-CM | POA: Diagnosis not present

## 2020-05-14 DIAGNOSIS — M5451 Vertebrogenic low back pain: Secondary | ICD-10-CM | POA: Diagnosis not present

## 2020-05-14 DIAGNOSIS — M5441 Lumbago with sciatica, right side: Secondary | ICD-10-CM | POA: Diagnosis not present

## 2020-05-14 DIAGNOSIS — M9903 Segmental and somatic dysfunction of lumbar region: Secondary | ICD-10-CM | POA: Diagnosis not present

## 2020-05-14 DIAGNOSIS — M9904 Segmental and somatic dysfunction of sacral region: Secondary | ICD-10-CM | POA: Diagnosis not present

## 2020-05-14 DIAGNOSIS — M5137 Other intervertebral disc degeneration, lumbosacral region: Secondary | ICD-10-CM | POA: Diagnosis not present

## 2020-05-15 DIAGNOSIS — M5137 Other intervertebral disc degeneration, lumbosacral region: Secondary | ICD-10-CM | POA: Diagnosis not present

## 2020-05-15 DIAGNOSIS — M5441 Lumbago with sciatica, right side: Secondary | ICD-10-CM | POA: Diagnosis not present

## 2020-05-15 DIAGNOSIS — M9903 Segmental and somatic dysfunction of lumbar region: Secondary | ICD-10-CM | POA: Diagnosis not present

## 2020-05-15 DIAGNOSIS — M9904 Segmental and somatic dysfunction of sacral region: Secondary | ICD-10-CM | POA: Diagnosis not present

## 2020-05-15 DIAGNOSIS — M5451 Vertebrogenic low back pain: Secondary | ICD-10-CM | POA: Diagnosis not present

## 2020-05-15 DIAGNOSIS — M7918 Myalgia, other site: Secondary | ICD-10-CM | POA: Diagnosis not present

## 2020-05-20 DIAGNOSIS — M5451 Vertebrogenic low back pain: Secondary | ICD-10-CM | POA: Diagnosis not present

## 2020-05-20 DIAGNOSIS — M5137 Other intervertebral disc degeneration, lumbosacral region: Secondary | ICD-10-CM | POA: Diagnosis not present

## 2020-05-20 DIAGNOSIS — M7918 Myalgia, other site: Secondary | ICD-10-CM | POA: Diagnosis not present

## 2020-05-20 DIAGNOSIS — M9903 Segmental and somatic dysfunction of lumbar region: Secondary | ICD-10-CM | POA: Diagnosis not present

## 2020-05-20 DIAGNOSIS — M5441 Lumbago with sciatica, right side: Secondary | ICD-10-CM | POA: Diagnosis not present

## 2020-05-20 DIAGNOSIS — M9904 Segmental and somatic dysfunction of sacral region: Secondary | ICD-10-CM | POA: Diagnosis not present

## 2020-05-21 DIAGNOSIS — M9904 Segmental and somatic dysfunction of sacral region: Secondary | ICD-10-CM | POA: Diagnosis not present

## 2020-05-21 DIAGNOSIS — M7918 Myalgia, other site: Secondary | ICD-10-CM | POA: Diagnosis not present

## 2020-05-21 DIAGNOSIS — M5137 Other intervertebral disc degeneration, lumbosacral region: Secondary | ICD-10-CM | POA: Diagnosis not present

## 2020-05-21 DIAGNOSIS — M5451 Vertebrogenic low back pain: Secondary | ICD-10-CM | POA: Diagnosis not present

## 2020-05-21 DIAGNOSIS — M9903 Segmental and somatic dysfunction of lumbar region: Secondary | ICD-10-CM | POA: Diagnosis not present

## 2020-05-21 DIAGNOSIS — M5441 Lumbago with sciatica, right side: Secondary | ICD-10-CM | POA: Diagnosis not present

## 2020-05-22 ENCOUNTER — Other Ambulatory Visit: Payer: Self-pay | Admitting: Family

## 2020-05-22 DIAGNOSIS — I10 Essential (primary) hypertension: Secondary | ICD-10-CM

## 2020-05-22 DIAGNOSIS — M5451 Vertebrogenic low back pain: Secondary | ICD-10-CM | POA: Diagnosis not present

## 2020-05-22 DIAGNOSIS — M5441 Lumbago with sciatica, right side: Secondary | ICD-10-CM | POA: Diagnosis not present

## 2020-05-22 DIAGNOSIS — M5137 Other intervertebral disc degeneration, lumbosacral region: Secondary | ICD-10-CM | POA: Diagnosis not present

## 2020-05-22 DIAGNOSIS — M7918 Myalgia, other site: Secondary | ICD-10-CM | POA: Diagnosis not present

## 2020-05-22 DIAGNOSIS — M9903 Segmental and somatic dysfunction of lumbar region: Secondary | ICD-10-CM | POA: Diagnosis not present

## 2020-05-22 DIAGNOSIS — M9904 Segmental and somatic dysfunction of sacral region: Secondary | ICD-10-CM | POA: Diagnosis not present

## 2020-05-26 ENCOUNTER — Ambulatory Visit (INDEPENDENT_AMBULATORY_CARE_PROVIDER_SITE_OTHER): Payer: Medicare HMO | Admitting: Family

## 2020-05-26 ENCOUNTER — Other Ambulatory Visit: Payer: Self-pay

## 2020-05-26 VITALS — BP 158/98 | HR 83 | Temp 98.0°F | Resp 16 | Wt 190.0 lb

## 2020-05-26 DIAGNOSIS — I10 Essential (primary) hypertension: Secondary | ICD-10-CM

## 2020-05-26 DIAGNOSIS — E119 Type 2 diabetes mellitus without complications: Secondary | ICD-10-CM

## 2020-05-26 MED ORDER — LOSARTAN POTASSIUM 50 MG PO TABS: 50.0000 mg | ORAL_TABLET | Freq: Every day | ORAL | 3 refills | Status: DC

## 2020-05-26 MED ORDER — AMLODIPINE BESYLATE 10 MG PO TABS: 10.0000 mg | ORAL_TABLET | Freq: Every day | ORAL | 1 refills | Status: DC

## 2020-05-26 NOTE — Progress Notes (Signed)
Subjective:    Patient ID: Diamond Hill, female    DOB: 1950-06-22, 70 y.o.   MRN: [id number]  CC: Diamond Hill is a 70 y.o. female who presents today for follow up.   HPI: Feels well today No new complaints.  HTN- at home 145/85 last night. Most of the time 130/80. No cp, sob. Compliant with amlodipine 10mg , lisinopril 10mg  qd. She had been on losartan 50mg  in 2019 and interested in going back as blood pressure had been better controlled.   DM- working on eating healthier. Previously declined metformin. Due for a1c. No numbness in feet, wounds.   HLD- continues to decline cholesterol medication.    HISTORY:  Past Medical History:  Diagnosis Date  . Blood in stool   . Cancer [facility]) 2005   rectal, Dr. Claybon Jabs, Dr. Mallie Snooks, Dr. Leamon Arnt, s/p XRT  . Colon polyp   . Diverticulitis   . Heart murmur   . Hypertension    Past Surgical History:  Procedure Laterality Date  . ABDOMINAL HYSTERECTOMY  1993   HAS CERVIX; ovaries intact; hysterectomy for non cancercous reasons, due to fibroid.   . rectal cancer  2005   removal of lesion   Family History  Problem Relation Age of Onset  . Cancer Mother        colon  . Hyperlipidemia Mother   . Hypertension Mother   . Hyperlipidemia Father   . Hypertension Father   . Diabetes Father   . Heart disease Father   . Hypertension Other   . Breast cancer Neg Hx     Allergies: Patient has no known allergies. Current Outpatient Medications on File Prior to Visit  Medication Sig Dispense Refill  . budesonide-formoterol (SYMBICORT) 80-4.5 MCG/ACT inhaler Inhale 2 puffs into the lungs in the morning and at bedtime. 1 each 12  . Cholecalciferol (VITAMIN D3) 125 MCG (5000 UT) CAPS Take 1 capsule by mouth daily.    . fish oil-omega-3 fatty acids 1000 MG capsule Take 2 g by mouth daily.    . fluticasone (FLONASE) 50 MCG/ACT nasal spray Place 2 sprays into both nostrils daily. 16 g 6  . Multiple Vitamin (MULTIVITAMIN) tablet Take 1 tablet by  mouth daily.     No current facility-administered medications on file prior to visit.    Social History   Tobacco Use  . Smoking status: Never Smoker  . Smokeless tobacco: Never Used  Substance Use Topics  . Alcohol use: Yes    Comment: occasional wine  . Drug use: No    Review of Systems  Constitutional: Negative for chills and fever.  Respiratory: Negative for cough.   Cardiovascular: Negative for chest pain, palpitations and leg swelling.  Gastrointestinal: Negative for nausea and vomiting.      Objective:    BP (!) 158/98   Pulse 83   Temp 98 F (36.7 C) (Oral)   Resp 16   Wt 190 lb (86.2 kg)   SpO2 99%   BMI 33.66 kg/m  BP Readings from Last 3 Encounters:  05/26/20 (!) 158/98  03/13/20 (!) 160/100  01/30/20 (!) 160/92   Wt Readings from Last 3 Encounters:  05/26/20 190 lb (86.2 kg)  04/18/20 191 lb (86.6 kg)  03/13/20 191 lb (86.6 kg)    Physical Exam Vitals reviewed.  Constitutional:      Appearance: She is well-developed.  Eyes:     Conjunctiva/sclera: Conjunctivae normal.  Cardiovascular:     Rate and Rhythm: Normal rate and  regular rhythm.     Pulses: Normal pulses.     Heart sounds: Normal heart sounds.  Pulmonary:     Effort: Pulmonary effort is normal.     Breath sounds: Normal breath sounds. No wheezing, rhonchi or rales.  Skin:    General: Skin is warm and dry.  Neurological:     Mental Status: She is alert.  Psychiatric:        Speech: Speech normal.        Behavior: Behavior normal.        Thought Content: Thought content normal.        Assessment & Plan:   Problem List Items Addressed This Visit      Cardiovascular and Mediastinum   Hypertension    Uncontrolled. Stop lisinopril, start losartan 50mg . Continue amlodipine 10mg .  Labs in one week      Relevant Medications   losartan (COZAAR) 50 MG tablet   amLODipine (NORVASC) 10 MG tablet     Endocrine   Diabetes mellitus without complication ([place]) - Primary     Anticipate improved. Pending a1c.       Relevant Medications   losartan (COZAAR) 50 MG tablet   Other Relevant Orders   Hemoglobin A1c    Other Visit Diagnoses    Essential hypertension       Relevant Medications   losartan (COZAAR) 50 MG tablet   amLODipine (NORVASC) 10 MG tablet       I have discontinued Diamond Hill's lisinopril. I have also changed her amLODipine. Additionally, [name] Lastly, I am having her maintain her multivitamin, Vitamin D3, fish oil-omega-3 fatty acids, fluticasone, and budesonide-formoterol.   Meds ordered this encounter  Medications  . losartan (COZAAR) 50 MG tablet    Sig: Take 1 tablet (50 mg total) by mouth daily.    Dispense:  90 tablet    Refill:  3    Order Specific Question:   Supervising Provider    Answer:   Diamond Hill [[id number]]  . amLODipine (NORVASC) 10 MG tablet    Sig: Take 1 tablet (10 mg total) by mouth daily.    Dispense:  90 tablet    Refill:  1    Order Specific Question:   Supervising Provider    Answer:   Diamond Hill [[id number]]    Return precautions given.   Risks, benefits, and alternatives of the medications and treatment plan prescribed today were discussed, and patient expressed understanding.   Education regarding symptom management and diagnosis given to patient on AVS.  Continue to follow with Diamond Hawthorne, FNP for routine health maintenance.   Diamond Hill and I agreed with plan.   Diamond Paris, FNP

## 2020-05-26 NOTE — Assessment & Plan Note (Signed)
Uncontrolled. Stop lisinopril, start losartan 50mg . Continue amlodipine 10mg .  Labs in one week

## 2020-05-26 NOTE — Assessment & Plan Note (Signed)
Anticipate improved. Pending a1c.  

## 2020-05-26 NOTE — Patient Instructions (Signed)
Stop lisinopril Start losartan  It is imperative that you are seen AT least twice per year for labs and monitoring. Monitor blood pressure at home and me 5-6 reading on separate days. Goal is less than 120/80, based on newest guidelines, however we certainly want to be less than 130/80;  if persistently higher, please make sooner follow up appointment so we can recheck you blood pressure and manage/ adjust medications.   LABS in one week

## 2020-05-27 DIAGNOSIS — M9904 Segmental and somatic dysfunction of sacral region: Secondary | ICD-10-CM | POA: Diagnosis not present

## 2020-05-27 DIAGNOSIS — M5451 Vertebrogenic low back pain: Secondary | ICD-10-CM | POA: Diagnosis not present

## 2020-05-27 DIAGNOSIS — M5441 Lumbago with sciatica, right side: Secondary | ICD-10-CM | POA: Diagnosis not present

## 2020-05-27 DIAGNOSIS — M5137 Other intervertebral disc degeneration, lumbosacral region: Secondary | ICD-10-CM | POA: Diagnosis not present

## 2020-05-27 DIAGNOSIS — M7918 Myalgia, other site: Secondary | ICD-10-CM | POA: Diagnosis not present

## 2020-05-27 DIAGNOSIS — M9903 Segmental and somatic dysfunction of lumbar region: Secondary | ICD-10-CM | POA: Diagnosis not present

## 2020-05-28 DIAGNOSIS — M5137 Other intervertebral disc degeneration, lumbosacral region: Secondary | ICD-10-CM | POA: Diagnosis not present

## 2020-05-28 DIAGNOSIS — M5441 Lumbago with sciatica, right side: Secondary | ICD-10-CM | POA: Diagnosis not present

## 2020-05-28 DIAGNOSIS — M9903 Segmental and somatic dysfunction of lumbar region: Secondary | ICD-10-CM | POA: Diagnosis not present

## 2020-05-28 DIAGNOSIS — M5451 Vertebrogenic low back pain: Secondary | ICD-10-CM | POA: Diagnosis not present

## 2020-05-28 DIAGNOSIS — M9904 Segmental and somatic dysfunction of sacral region: Secondary | ICD-10-CM | POA: Diagnosis not present

## 2020-05-28 DIAGNOSIS — M7918 Myalgia, other site: Secondary | ICD-10-CM | POA: Diagnosis not present

## 2020-05-29 DIAGNOSIS — M5441 Lumbago with sciatica, right side: Secondary | ICD-10-CM | POA: Diagnosis not present

## 2020-05-29 DIAGNOSIS — M9904 Segmental and somatic dysfunction of sacral region: Secondary | ICD-10-CM | POA: Diagnosis not present

## 2020-05-29 DIAGNOSIS — M7918 Myalgia, other site: Secondary | ICD-10-CM | POA: Diagnosis not present

## 2020-05-29 DIAGNOSIS — M5137 Other intervertebral disc degeneration, lumbosacral region: Secondary | ICD-10-CM | POA: Diagnosis not present

## 2020-05-29 DIAGNOSIS — M5451 Vertebrogenic low back pain: Secondary | ICD-10-CM | POA: Diagnosis not present

## 2020-05-29 DIAGNOSIS — M9903 Segmental and somatic dysfunction of lumbar region: Secondary | ICD-10-CM | POA: Diagnosis not present

## 2020-06-02 DIAGNOSIS — M7918 Myalgia, other site: Secondary | ICD-10-CM | POA: Diagnosis not present

## 2020-06-02 DIAGNOSIS — M9904 Segmental and somatic dysfunction of sacral region: Secondary | ICD-10-CM | POA: Diagnosis not present

## 2020-06-02 DIAGNOSIS — M5441 Lumbago with sciatica, right side: Secondary | ICD-10-CM | POA: Diagnosis not present

## 2020-06-02 DIAGNOSIS — M9903 Segmental and somatic dysfunction of lumbar region: Secondary | ICD-10-CM | POA: Diagnosis not present

## 2020-06-02 DIAGNOSIS — M5451 Vertebrogenic low back pain: Secondary | ICD-10-CM | POA: Diagnosis not present

## 2020-06-02 DIAGNOSIS — M5137 Other intervertebral disc degeneration, lumbosacral region: Secondary | ICD-10-CM | POA: Diagnosis not present

## 2020-06-03 ENCOUNTER — Other Ambulatory Visit: Payer: Self-pay

## 2020-06-03 ENCOUNTER — Other Ambulatory Visit (INDEPENDENT_AMBULATORY_CARE_PROVIDER_SITE_OTHER): Payer: Medicare HMO

## 2020-06-03 DIAGNOSIS — I10 Essential (primary) hypertension: Secondary | ICD-10-CM

## 2020-06-03 DIAGNOSIS — E119 Type 2 diabetes mellitus without complications: Secondary | ICD-10-CM

## 2020-06-03 LAB — COMPREHENSIVE METABOLIC PANEL
ALT: 16 U/L (ref 0–35)
AST: 14 U/L (ref 0–37)
Albumin: 4.2 g/dL (ref 3.5–5.2)
Alkaline Phosphatase: 59 U/L (ref 39–117)
BUN: 15 mg/dL (ref 6–23)
CO2: 32 mEq/L (ref 19–32)
Calcium: 9.7 mg/dL (ref 8.4–10.5)
Chloride: 104 mEq/L (ref 96–112)
Creatinine, Ser: 0.76 mg/dL (ref 0.40–1.20)
GFR: 80.9 mL/min (ref >60.00)
Glucose, Bld: 104 mg/dL — ABNORMAL HIGH (ref 70–99)
Potassium: 3.7 mEq/L (ref 3.5–5.1)
Sodium: 143 mEq/L (ref 135–145)
Total Bilirubin: 0.6 mg/dL (ref 0.2–1.2)
Total Protein: 7.3 g/dL (ref 6.0–8.3)

## 2020-06-03 LAB — LIPID PANEL
Cholesterol: 200 mg/dL (ref 0–200)
HDL: 84.3 mg/dL (ref >39.00)
LDL Cholesterol: 100 mg/dL — ABNORMAL HIGH (ref 0–99)
NonHDL: 115.69
Total CHOL/HDL Ratio: 2
Triglycerides: 78 mg/dL (ref 0.0–149.0)
VLDL: 15.6 mg/dL (ref 0.0–40.0)

## 2020-06-03 LAB — HEMOGLOBIN A1C: Hgb A1c MFr Bld: 6.5 % (ref 4.6–6.5)

## 2020-06-10 ENCOUNTER — Other Ambulatory Visit: Payer: Self-pay

## 2020-06-10 DIAGNOSIS — E7800 Pure hypercholesterolemia, unspecified: Secondary | ICD-10-CM

## 2020-06-10 MED ORDER — ROSUVASTATIN CALCIUM 10 MG PO TABS: 10.0000 mg | ORAL_TABLET | Freq: Every day | ORAL | 3 refills | Status: DC

## 2020-07-07 ENCOUNTER — Ambulatory Visit: Payer: Medicare HMO | Admitting: Family

## 2020-07-08 DIAGNOSIS — M7918 Myalgia, other site: Secondary | ICD-10-CM | POA: Diagnosis not present

## 2020-07-08 DIAGNOSIS — M9903 Segmental and somatic dysfunction of lumbar region: Secondary | ICD-10-CM | POA: Diagnosis not present

## 2020-07-08 DIAGNOSIS — M9904 Segmental and somatic dysfunction of sacral region: Secondary | ICD-10-CM | POA: Diagnosis not present

## 2020-07-08 DIAGNOSIS — M5137 Other intervertebral disc degeneration, lumbosacral region: Secondary | ICD-10-CM | POA: Diagnosis not present

## 2020-07-08 DIAGNOSIS — M5441 Lumbago with sciatica, right side: Secondary | ICD-10-CM | POA: Diagnosis not present

## 2020-07-08 DIAGNOSIS — M5451 Vertebrogenic low back pain: Secondary | ICD-10-CM | POA: Diagnosis not present

## 2020-07-18 ENCOUNTER — Ambulatory Visit (INDEPENDENT_AMBULATORY_CARE_PROVIDER_SITE_OTHER): Payer: Medicare HMO | Admitting: Family

## 2020-07-18 ENCOUNTER — Other Ambulatory Visit: Payer: Self-pay

## 2020-07-18 ENCOUNTER — Encounter: Payer: Self-pay | Admitting: Family

## 2020-07-18 VITALS — BP 140/90 | HR 79 | Temp 97.6°F | Ht 63.0 in | Wt 190.2 lb

## 2020-07-18 DIAGNOSIS — I10 Essential (primary) hypertension: Secondary | ICD-10-CM

## 2020-07-18 DIAGNOSIS — E118 Type 2 diabetes mellitus with unspecified complications: Secondary | ICD-10-CM

## 2020-07-18 DIAGNOSIS — Z13820 Encounter for screening for osteoporosis: Secondary | ICD-10-CM

## 2020-07-18 DIAGNOSIS — Z1231 Encounter for screening mammogram for malignant neoplasm of breast: Secondary | ICD-10-CM

## 2020-07-18 DIAGNOSIS — R062 Wheezing: Secondary | ICD-10-CM

## 2020-07-18 DIAGNOSIS — J309 Allergic rhinitis, unspecified: Secondary | ICD-10-CM

## 2020-07-18 MED ORDER — BUDESONIDE-FORMOTEROL FUMARATE 80-4.5 MCG/ACT IN AERO
2.0000 | INHALATION_SPRAY | Freq: Two times a day (BID) | RESPIRATORY_TRACT | 12 refills | Status: DC
Start: 2020-07-18 — End: 2021-06-05

## 2020-07-18 MED ORDER — FLUTICASONE PROPIONATE 50 MCG/ACT NA SUSP: 2.0000 | Freq: Every day | NASAL | 6 refills | Status: DC

## 2020-07-18 NOTE — Assessment & Plan Note (Addendum)
Controlled at home and patient declines increase of losartan today. Continue losartan 50mg , amlodipine 10mg . Patient advised to continue blood pressure monitoring at home and to bring BP machine to next visit.

## 2020-07-18 NOTE — Progress Notes (Signed)
Subjective:    Patient ID: Diamond Hill, female    DOB: Aug 18, 1950, 70 y.o.   MRN: [id number]  CC: Diamond Hill is a 70 y.o. female who presents today for follow up.   HPI: Feels well today No new complaints  HTN- started losartan 50mg  at last visit.she is compliant with amlodipine 10mg . At home BP 127/80 up to 130/80.   No cp, sob.   HLD-she decided not to start crestor   Colonoscopy scheduled     HISTORY:  Past Medical History:  Diagnosis Date  . Blood in stool   . Cancer [facility]) 2005   rectal, Dr. Claybon Hill, Dr. Mallie Hill, Dr. Leamon Hill, s/p XRT  . Colon polyp   . Diverticulitis   . Heart murmur   . Hypertension    Past Surgical History:  Procedure Laterality Date  . ABDOMINAL HYSTERECTOMY  1993   HAS CERVIX; ovaries intact; hysterectomy for non cancercous reasons, due to fibroid.   . rectal cancer  2005   removal of lesion   Family History  Problem Relation Age of Onset  . Cancer Mother        colon  . Hyperlipidemia Mother   . Hypertension Mother   . Hyperlipidemia Father   . Hypertension Father   . Diabetes Father   . Heart disease Father   . Hypertension Other   . Breast cancer Neg Hx     Allergies: Patient has no known allergies. Current Outpatient Medications on File Prior to Visit  Medication Sig Dispense Refill  . amLODipine (NORVASC) 10 MG tablet Take 1 tablet (10 mg total) by mouth daily. 90 tablet 1  . losartan (COZAAR) 50 MG tablet Take 1 tablet (50 mg total) by mouth daily. 90 tablet 3  . Cholecalciferol (VITAMIN D3) 125 MCG (5000 UT) CAPS Take 1 capsule by mouth daily. (Patient not taking: Reported on 07/18/2020)    . fish oil-omega-3 fatty acids 1000 MG capsule Take 2 g by mouth daily. (Patient not taking: Reported on 07/18/2020)    . Multiple Vitamin (MULTIVITAMIN) tablet Take 1 tablet by mouth daily. (Patient not taking: Reported on 07/18/2020)    . rosuvastatin (CRESTOR) 10 MG tablet Take 1 tablet (10 mg total) by mouth daily. (Patient not  taking: Reported on 07/18/2020) 30 tablet 3   No current facility-administered medications on file prior to visit.    Social History   Tobacco Use  . Smoking status: Never Smoker  . Smokeless tobacco: Never Used  Substance Use Topics  . Alcohol use: Yes    Comment: occasional wine  . Drug use: No    Review of Systems  Constitutional: Negative for chills and fever.  Respiratory: Negative for cough.   Cardiovascular: Negative for chest pain and palpitations.  Gastrointestinal: Negative for nausea and vomiting.      Objective:    BP 140/90 (BP Location: Left Arm, Patient Position: Sitting, Cuff Size: Large)   Pulse 79   Temp 97.6 F (36.4 C) (Oral)   Ht 5\' 3"  (1.6 m)   Wt 190 lb 3.2 oz (86.3 kg)   SpO2 99%   BMI 33.69 kg/m  BP Readings from Last 3 Encounters:  07/18/20 140/90  05/26/20 (!) 158/98  03/13/20 (!) 160/100   Wt Readings from Last 3 Encounters:  07/18/20 190 lb 3.2 oz (86.3 kg)  05/26/20 190 lb (86.2 kg)  04/18/20 191 lb (86.6 kg)    Physical Exam Vitals reviewed.  Constitutional:      Appearance:  She is well-developed.  Eyes:     Conjunctiva/sclera: Conjunctivae normal.  Cardiovascular:     Rate and Rhythm: Normal rate and regular rhythm.     Pulses: Normal pulses.     Heart sounds: Normal heart sounds.  Pulmonary:     Effort: Pulmonary effort is normal.     Breath sounds: Normal breath sounds. No wheezing, rhonchi or rales.  Skin:    General: Skin is warm and dry.  Neurological:     Mental Status: She is alert.  Psychiatric:        Speech: Speech normal.        Behavior: Behavior normal.        Thought Content: Thought content normal.        Assessment & Plan:   Problem List Items Addressed This Visit      Cardiovascular and Mediastinum   Hypertension    Controlled at home and patient declines increase of losartan today. Continue losartan 50mg , amlodipine 10mg . Patient advised to continue blood pressure monitoring at home and to  bring BP machine to next visit.         Endocrine   DM (diabetes mellitus) with complications [facility])    Lab Results  Component Value Date   HGBA1C 6.5 06/03/2020   Controlled. Patient declines metformin and wants to treat with lifestyle. Complicated by HLD, advised LDL goal < 70 to reduce her elevated CVD risk. She declines and would like to monitor, focus on weight loss. Close follow up.        Other Visit Diagnoses    Screening for osteoporosis    -  Primary   Relevant Orders   DG Bone Density   Encounter for screening mammogram for malignant neoplasm of breast       Relevant Orders   MM 3D SCREEN BREAST BILATERAL       I am having Diamond Hill maintain her multivitamin, Vitamin D3, fish oil-omega-3 fatty acids, losartan, amLODipine, and rosuvastatin.   No orders of the defined types were placed in this encounter.   Return precautions given.   Risks, benefits, and alternatives of the medications and treatment plan prescribed today were discussed, and patient expressed understanding.   Education regarding symptom management and diagnosis given to patient on AVS.  Continue to follow with Diamond Hawthorne, FNP for routine health maintenance.   Diamond Hill and I agreed with plan.   Diamond Paris, FNP

## 2020-07-18 NOTE — Assessment & Plan Note (Addendum)
Lab Results  Component Value Date   HGBA1C 6.5 06/03/2020   Controlled. Patient declines metformin and wants to treat with lifestyle. Complicated by HLD, advised LDL goal < 70 to reduce her elevated CVD risk. She declines and would like to monitor, focus on weight loss. Close follow up.

## 2020-07-18 NOTE — Patient Instructions (Signed)
Let's focus on weight loss  It is imperative that you are seen AT least twice per year for labs and monitoring. Monitor blood pressure at home and me 5-6 reading on separate days. Goal is less than 120/80, based on newest guidelines, however we certainly want to be less than 130/80;  if persistently higher, please make sooner follow up appointment so we can recheck you blood pressure and manage/ adjust medications.

## 2020-08-01 DIAGNOSIS — K5520 Angiodysplasia of colon without hemorrhage: Secondary | ICD-10-CM | POA: Diagnosis not present

## 2020-08-01 DIAGNOSIS — Z1211 Encounter for screening for malignant neoplasm of colon: Secondary | ICD-10-CM | POA: Diagnosis not present

## 2020-08-01 DIAGNOSIS — D128 Benign neoplasm of rectum: Secondary | ICD-10-CM | POA: Diagnosis not present

## 2020-08-01 DIAGNOSIS — D124 Benign neoplasm of descending colon: Secondary | ICD-10-CM | POA: Diagnosis not present

## 2020-08-01 DIAGNOSIS — D120 Benign neoplasm of cecum: Secondary | ICD-10-CM | POA: Diagnosis not present

## 2020-08-01 DIAGNOSIS — Z9221 Personal history of antineoplastic chemotherapy: Secondary | ICD-10-CM | POA: Diagnosis not present

## 2020-08-01 DIAGNOSIS — C20 Malignant neoplasm of rectum: Secondary | ICD-10-CM | POA: Diagnosis not present

## 2020-08-01 DIAGNOSIS — Y842 Radiological procedure and radiotherapy as the cause of abnormal reaction of the patient, or of later complication, without mention of misadventure at the time of the procedure: Secondary | ICD-10-CM | POA: Diagnosis not present

## 2020-08-01 DIAGNOSIS — Z85048 Personal history of other malignant neoplasm of rectum, rectosigmoid junction, and anus: Secondary | ICD-10-CM | POA: Diagnosis not present

## 2020-08-01 DIAGNOSIS — Z923 Personal history of irradiation: Secondary | ICD-10-CM | POA: Diagnosis not present

## 2020-08-01 DIAGNOSIS — K627 Radiation proctitis: Secondary | ICD-10-CM | POA: Diagnosis not present

## 2020-08-01 DIAGNOSIS — K648 Other hemorrhoids: Secondary | ICD-10-CM | POA: Diagnosis not present

## 2020-08-01 DIAGNOSIS — D123 Benign neoplasm of transverse colon: Secondary | ICD-10-CM | POA: Diagnosis not present

## 2020-08-01 DIAGNOSIS — D122 Benign neoplasm of ascending colon: Secondary | ICD-10-CM | POA: Diagnosis not present

## 2020-08-14 DIAGNOSIS — M9904 Segmental and somatic dysfunction of sacral region: Secondary | ICD-10-CM | POA: Diagnosis not present

## 2020-08-14 DIAGNOSIS — M9903 Segmental and somatic dysfunction of lumbar region: Secondary | ICD-10-CM | POA: Diagnosis not present

## 2020-08-14 DIAGNOSIS — M5441 Lumbago with sciatica, right side: Secondary | ICD-10-CM | POA: Diagnosis not present

## 2020-08-14 DIAGNOSIS — M7918 Myalgia, other site: Secondary | ICD-10-CM | POA: Diagnosis not present

## 2020-08-14 DIAGNOSIS — M5137 Other intervertebral disc degeneration, lumbosacral region: Secondary | ICD-10-CM | POA: Diagnosis not present

## 2020-08-14 DIAGNOSIS — M5451 Vertebrogenic low back pain: Secondary | ICD-10-CM | POA: Diagnosis not present

## 2020-10-14 DIAGNOSIS — R69 Illness, unspecified: Secondary | ICD-10-CM | POA: Diagnosis not present

## 2020-10-14 DIAGNOSIS — G8929 Other chronic pain: Secondary | ICD-10-CM | POA: Diagnosis not present

## 2020-10-14 DIAGNOSIS — Z008 Encounter for other general examination: Secondary | ICD-10-CM | POA: Diagnosis not present

## 2020-10-14 DIAGNOSIS — I951 Orthostatic hypotension: Secondary | ICD-10-CM | POA: Diagnosis not present

## 2020-10-14 DIAGNOSIS — E669 Obesity, unspecified: Secondary | ICD-10-CM | POA: Diagnosis not present

## 2020-10-14 DIAGNOSIS — E1142 Type 2 diabetes mellitus with diabetic polyneuropathy: Secondary | ICD-10-CM | POA: Diagnosis not present

## 2020-10-14 DIAGNOSIS — H269 Unspecified cataract: Secondary | ICD-10-CM | POA: Diagnosis not present

## 2020-10-14 DIAGNOSIS — Z6832 Body mass index (BMI) 32.0-32.9, adult: Secondary | ICD-10-CM | POA: Diagnosis not present

## 2020-10-14 DIAGNOSIS — Z7722 Contact with and (suspected) exposure to environmental tobacco smoke (acute) (chronic): Secondary | ICD-10-CM | POA: Diagnosis not present

## 2020-10-14 DIAGNOSIS — I10 Essential (primary) hypertension: Secondary | ICD-10-CM | POA: Diagnosis not present

## 2020-10-14 DIAGNOSIS — J45909 Unspecified asthma, uncomplicated: Secondary | ICD-10-CM | POA: Diagnosis not present

## 2020-10-16 LAB — HM DIABETES EYE EXAM

## 2020-10-20 ENCOUNTER — Telehealth: Payer: Self-pay | Admitting: Family

## 2020-10-20 NOTE — Telephone Encounter (Signed)
Received patient Retinal Scan from [facility] . Please results and notify patient once completed abstract to chart.

## 2020-10-21 ENCOUNTER — Ambulatory Visit (INDEPENDENT_AMBULATORY_CARE_PROVIDER_SITE_OTHER): Payer: Medicare HMO | Admitting: Family

## 2020-10-21 ENCOUNTER — Encounter: Payer: Self-pay | Admitting: Family

## 2020-10-21 ENCOUNTER — Other Ambulatory Visit: Payer: Self-pay

## 2020-10-21 VITALS — BP 130/90 | HR 80 | Temp 95.5°F | Ht 63.0 in | Wt 185.6 lb

## 2020-10-21 DIAGNOSIS — R7303 Prediabetes: Secondary | ICD-10-CM | POA: Diagnosis not present

## 2020-10-21 DIAGNOSIS — I10 Essential (primary) hypertension: Secondary | ICD-10-CM

## 2020-10-21 DIAGNOSIS — E785 Hyperlipidemia, unspecified: Secondary | ICD-10-CM

## 2020-10-21 DIAGNOSIS — E118 Type 2 diabetes mellitus with unspecified complications: Secondary | ICD-10-CM

## 2020-10-21 LAB — LIPID PANEL
Cholesterol: 199 mg/dL (ref 0–200)
HDL: 99.4 mg/dL (ref >39.00)
LDL Cholesterol: 87 mg/dL (ref 0–99)
NonHDL: 100.08
Total CHOL/HDL Ratio: 2
Triglycerides: 65 mg/dL (ref 0.0–149.0)
VLDL: 13 mg/dL (ref 0.0–40.0)

## 2020-10-21 LAB — COMPREHENSIVE METABOLIC PANEL
ALT: 16 U/L (ref 0–35)
AST: 14 U/L (ref 0–37)
Albumin: 4.4 g/dL (ref 3.5–5.2)
Alkaline Phosphatase: 60 U/L (ref 39–117)
BUN: 18 mg/dL (ref 6–23)
CO2: 29 mEq/L (ref 19–32)
Calcium: 9.8 mg/dL (ref 8.4–10.5)
Chloride: 104 mEq/L (ref 96–112)
Creatinine, Ser: 0.81 mg/dL (ref 0.40–1.20)
GFR: 74.75 mL/min (ref >60.00)
Glucose, Bld: 106 mg/dL — ABNORMAL HIGH (ref 70–99)
Potassium: 3.8 mEq/L (ref 3.5–5.1)
Sodium: 143 mEq/L (ref 135–145)
Total Bilirubin: 0.5 mg/dL (ref 0.2–1.2)
Total Protein: 7.7 g/dL (ref 6.0–8.3)

## 2020-10-21 LAB — HEMOGLOBIN A1C: Hgb A1c MFr Bld: 6.5 % (ref 4.6–6.5)

## 2020-10-21 NOTE — Patient Instructions (Signed)
Please continue to monitor your blood pressure It is imperative that you are seen AT least twice per year for labs and monitoring. Monitor blood pressure at home and me 5-6 reading on separate days. Goal is less than 120/80, based on newest guidelines, however we certainly want to be less than 130/80;  if persistently higher, please make sooner follow up appointment so we can recheck you blood pressure and manage/ adjust medications.  Referral for annual eye exam Let us know if you dont hear back within a week in regards to an appointment being scheduled.

## 2020-10-21 NOTE — Assessment & Plan Note (Addendum)
Elevated today.  Advised patient to increase losartan to 100 mg.  She politely declines as she wants to focus even more so on lifestyle modifications, weight loss.  Advised her to monitor blood pressure at home.  For now, will continue losartan 50 mg, amlodipine 10 mg.

## 2020-10-21 NOTE — Telephone Encounter (Signed)
Call pt  Received iris retinal scan result.  Normal study return for follow-up exam in 12 months. However, I still advised patient to have annual eye exam with ophthalmology.  That referral was placed today as well

## 2020-10-21 NOTE — Assessment & Plan Note (Signed)
Pending lipid panel.  Likely will recommend again resuming Crestor 10 mg

## 2020-10-21 NOTE — Assessment & Plan Note (Signed)
Lab Results  Component Value Date   HGBA1C 6.5 06/03/2020

## 2020-10-21 NOTE — Progress Notes (Signed)
Subjective:    Patient ID: Diamond Hill, female    DOB: 1950/10/13, 70 y.o.   MRN: [id number]  CC: Diamond Hill is a 70 y.o. female who presents today for follow up.   HPI: Feels well today  No complaints.   HLD- She has stopped crestor '10mg'$ .  She is working on cholesterol with lifestyle modifications including walking.  HTN-compliant losartan 50 mg, amlodipine 10 mg DM- managing with low glycemic diet. She is due eye exam.    She has lost weight and has increased exercise.  Walks daily for 30 minutes. No cp, sob.     Colonoscopy done 08/01/20, with [facility], Dr Teresa Pelton, told to repeat in 3 years. She reports she had polyps.  HISTORY:  Past Medical History:  Diagnosis Date   Blood in stool    Cancer [facility]) 2005   rectal, Dr. Claybon Jabs, Dr. Mallie Snooks, Dr. Leamon Arnt, s/p XRT   Colon polyp    Diverticulitis    Heart murmur    Hypertension    Past Surgical History:  Procedure Laterality Date   ABDOMINAL HYSTERECTOMY  1993   HAS CERVIX; ovaries intact; hysterectomy for non cancercous reasons, due to fibroid.    rectal cancer  2005   removal of lesion   Family History  Problem Relation Age of Onset   Cancer Mother        colon   Hyperlipidemia Mother    Hypertension Mother    Hyperlipidemia Father    Hypertension Father    Diabetes Father    Heart disease Father    Hypertension Other    Breast cancer Neg Hx     Allergies: Patient has no known allergies. Current Outpatient Medications on File Prior to Visit  Medication Sig Dispense Refill   amLODipine (NORVASC) 10 MG tablet Take 1 tablet (10 mg total) by mouth daily. 90 tablet 1   b complex vitamins capsule Take 1 capsule by mouth daily.     budesonide-formoterol (SYMBICORT) 80-4.5 MCG/ACT inhaler Inhale 2 puffs into the lungs in the morning and at bedtime. 1 each 12   Cholecalciferol (VITAMIN D3) 125 MCG (5000 UT) CAPS Take 1 capsule by mouth daily.     fluticasone (FLONASE) 50 MCG/ACT nasal spray Place 2  sprays into both nostrils daily. 16 g 6   losartan (COZAAR) 50 MG tablet Take 1 tablet (50 mg total) by mouth daily. 90 tablet 3   fish oil-omega-3 fatty acids 1000 MG capsule Take 2 g by mouth daily. (Patient not taking: Reported on 10/21/2020)     rosuvastatin (CRESTOR) 10 MG tablet Take 1 tablet (10 mg total) by mouth daily. (Patient not taking: Reported on 10/21/2020) 30 tablet 3   No current facility-administered medications on file prior to visit.    Social History   Tobacco Use   Smoking status: Never   Smokeless tobacco: Never  Substance Use Topics   Alcohol use: Yes    Comment: occasional wine   Drug use: No    Review of Systems  Constitutional:  Negative for chills and fever.  Respiratory:  Negative for cough.   Cardiovascular:  Negative for chest pain and palpitations.  Gastrointestinal:  Negative for nausea and vomiting.     Objective:    BP 130/90   Pulse 80   Temp (!) 95.5 F (35.3 C) (Temporal)   Ht '5\' 3"'$  (1.6 m)   Wt 185 lb 9.6 oz (84.2 kg)   SpO2 99%   BMI 32.88  kg/m  BP Readings from Last 3 Encounters:  10/21/20 130/90  07/18/20 140/90  05/26/20 (!) 158/98   Wt Readings from Last 3 Encounters:  10/21/20 185 lb 9.6 oz (84.2 kg)  07/18/20 190 lb 3.2 oz (86.3 kg)  05/26/20 190 lb (86.2 kg)    Physical Exam Vitals reviewed.  Constitutional:      Appearance: She is well-developed.  Eyes:     Conjunctiva/sclera: Conjunctivae normal.  Cardiovascular:     Rate and Rhythm: Normal rate and regular rhythm.     Pulses: Normal pulses.     Heart sounds: Normal heart sounds.  Pulmonary:     Effort: Pulmonary effort is normal.     Breath sounds: Normal breath sounds. No wheezing, rhonchi or rales.  Skin:    General: Skin is warm and dry.  Neurological:     Mental Status: She is alert.  Psychiatric:        Speech: Speech normal.        Behavior: Behavior normal.        Thought Content: Thought content normal.       Assessment & Plan:   Problem  List Items Addressed This Visit       Cardiovascular and Mediastinum   Hypertension    Elevated today.  Advised patient to increase losartan to 100 mg.  She politely declines as she wants to focus even more so on lifestyle modifications, weight loss.  Advised her to monitor blood pressure at home.  For now, will continue losartan 50 mg, amlodipine 10 mg.        Endocrine   DM (diabetes mellitus) with complications ([place]) - Primary    Pending A1c.  She is currently managing diabetes with low glycemic diet, exercise.  Will monitor      Relevant Orders   Hemoglobin A1c   Comprehensive metabolic panel   Lipid panel   Ambulatory referral to Ophthalmology     Other   HLD (hyperlipidemia)    Pending lipid panel.  Likely will recommend again resuming Crestor 10 mg      Prediabetes    Lab Results  Component Value Date   HGBA1C 6.5 06/03/2020          I have discontinued Sahasra W. Heffron's multivitamin. I am also having her maintain her Vitamin D3, fish oil-omega-3 fatty acids, losartan, amLODipine, rosuvastatin, budesonide-formoterol, fluticasone, and b complex vitamins.   No orders of the defined types were placed in this encounter.   Return precautions given.   Risks, benefits, and alternatives of the medications and treatment plan prescribed today were discussed, and patient expressed understanding.   Education regarding symptom management and diagnosis given to patient on AVS.  Continue to follow with Burnard Hawthorne, FNP for routine health maintenance.   Diamond Hill and I agreed with plan.   [name], FNP

## 2020-10-21 NOTE — Assessment & Plan Note (Signed)
Pending A1c.  She is currently managing diabetes with low glycemic diet, exercise.  Will monitor

## 2020-10-24 NOTE — Telephone Encounter (Signed)
Called and spoke with [facility]. Char verbalized understanding and states that she has her ophthalmology appointment in November. Pt asked for labs and verbalized understanding to her labs. Pt declines starting Crestor and states that she will continue to work on her LDL. Breleigh had no further questions.

## 2020-10-27 ENCOUNTER — Ambulatory Visit
Admission: RE | Admit: 2020-10-27 | Discharge: 2020-10-27 | Disposition: A | Discharge status: Home / Self Care | Payer: Medicare HMO | Source: Ambulatory Visit | Attending: Family | Admitting: Family

## 2020-10-27 ENCOUNTER — Other Ambulatory Visit: Payer: Self-pay

## 2020-10-27 DIAGNOSIS — Z780 Asymptomatic menopausal state: Secondary | ICD-10-CM | POA: Diagnosis not present

## 2020-10-27 DIAGNOSIS — Z13820 Encounter for screening for osteoporosis: Secondary | ICD-10-CM | POA: Insufficient documentation

## 2020-10-27 DIAGNOSIS — Z1231 Encounter for screening mammogram for malignant neoplasm of breast: Secondary | ICD-10-CM

## 2020-11-17 ENCOUNTER — Other Ambulatory Visit: Payer: Self-pay | Admitting: Family

## 2020-11-17 DIAGNOSIS — I10 Essential (primary) hypertension: Secondary | ICD-10-CM

## 2020-11-24 DIAGNOSIS — M9903 Segmental and somatic dysfunction of lumbar region: Secondary | ICD-10-CM | POA: Diagnosis not present

## 2020-11-24 DIAGNOSIS — M7918 Myalgia, other site: Secondary | ICD-10-CM | POA: Diagnosis not present

## 2020-11-24 DIAGNOSIS — M5451 Vertebrogenic low back pain: Secondary | ICD-10-CM | POA: Diagnosis not present

## 2020-11-24 DIAGNOSIS — M5137 Other intervertebral disc degeneration, lumbosacral region: Secondary | ICD-10-CM | POA: Diagnosis not present

## 2020-11-24 DIAGNOSIS — M5441 Lumbago with sciatica, right side: Secondary | ICD-10-CM | POA: Diagnosis not present

## 2020-11-24 DIAGNOSIS — M9904 Segmental and somatic dysfunction of sacral region: Secondary | ICD-10-CM | POA: Diagnosis not present

## 2020-12-15 DIAGNOSIS — M7918 Myalgia, other site: Secondary | ICD-10-CM | POA: Diagnosis not present

## 2020-12-15 DIAGNOSIS — M5451 Vertebrogenic low back pain: Secondary | ICD-10-CM | POA: Diagnosis not present

## 2020-12-15 DIAGNOSIS — M9904 Segmental and somatic dysfunction of sacral region: Secondary | ICD-10-CM | POA: Diagnosis not present

## 2020-12-15 DIAGNOSIS — M5441 Lumbago with sciatica, right side: Secondary | ICD-10-CM | POA: Diagnosis not present

## 2020-12-15 DIAGNOSIS — M5137 Other intervertebral disc degeneration, lumbosacral region: Secondary | ICD-10-CM | POA: Diagnosis not present

## 2020-12-15 DIAGNOSIS — M9903 Segmental and somatic dysfunction of lumbar region: Secondary | ICD-10-CM | POA: Diagnosis not present

## 2021-01-21 ENCOUNTER — Ambulatory Visit: Payer: Medicare HMO | Admitting: Family

## 2021-01-26 DIAGNOSIS — H47093 Other disorders of optic nerve, not elsewhere classified, bilateral: Secondary | ICD-10-CM | POA: Diagnosis not present

## 2021-01-26 DIAGNOSIS — E119 Type 2 diabetes mellitus without complications: Secondary | ICD-10-CM | POA: Diagnosis not present

## 2021-01-26 LAB — HM DIABETES EYE EXAM

## 2021-03-06 ENCOUNTER — Ambulatory Visit (INDEPENDENT_AMBULATORY_CARE_PROVIDER_SITE_OTHER): Payer: Medicare HMO | Admitting: Family

## 2021-03-06 ENCOUNTER — Encounter: Payer: Self-pay | Admitting: Family

## 2021-03-06 ENCOUNTER — Other Ambulatory Visit: Payer: Self-pay

## 2021-03-06 VITALS — BP 120/72 | HR 85 | Temp 98.1°F | Ht 63.0 in | Wt 187.6 lb

## 2021-03-06 DIAGNOSIS — E118 Type 2 diabetes mellitus with unspecified complications: Secondary | ICD-10-CM | POA: Diagnosis not present

## 2021-03-06 DIAGNOSIS — I10 Essential (primary) hypertension: Secondary | ICD-10-CM | POA: Diagnosis not present

## 2021-03-06 LAB — POCT GLYCOSYLATED HEMOGLOBIN (HGB A1C): Hemoglobin A1C: 6 % — AB (ref 4.0–5.6)

## 2021-03-06 NOTE — Patient Instructions (Addendum)
We recommend you get the updated bivalent COVID-19 booster, at least 2 months after any prior doses. You may consider delaying a booster dose by 3 months from a prior episode of COVID-19 per the CDC.   You can find pharmacies that have this formulation in stock at [email or web address]    Nice to see you!

## 2021-03-06 NOTE — Assessment & Plan Note (Signed)
Chronic, stable.  Continue losartan 50 mg, amlodipine 10 mg

## 2021-03-06 NOTE — Progress Notes (Signed)
Subjective:    Patient ID: Diamond Hill, female    DOB: Oct 21, 1950, 70 y.o.   MRN: [id number]  CC: Diamond Hill is a 70 y.o. female who presents today for follow up.   HPI: Feels well today No complaints.   HTN- compliant with losartan 50mg , amlodipine 10mg  At home, BP 127/85. No cp.   DM- managing with diet.   She is not taking crestor and prefers to work on lifestyle    HISTORY:  Past Medical History:  Diagnosis Date   Blood in stool    Cancer [facility]) 2005   rectal, Dr. Claybon Hill, Dr. Mallie Hill, Dr. Leamon Hill, s/p XRT   Colon polyp    Diverticulitis    Heart murmur    Hypertension    Past Surgical History:  Procedure Laterality Date   ABDOMINAL HYSTERECTOMY  1993   HAS CERVIX; ovaries intact; hysterectomy for non cancercous reasons, due to fibroid.    rectal cancer  2005   removal of lesion   Family History  Problem Relation Age of Onset   Cancer Mother        colon   Hyperlipidemia Mother    Hypertension Mother    Hyperlipidemia Father    Hypertension Father    Diabetes Father    Heart disease Father    Hypertension Other    Breast cancer Neg Hx     Allergies: Patient has no known allergies. Current Outpatient Medications on File Prior to Visit  Medication Sig Dispense Refill   amLODipine (NORVASC) 10 MG tablet TAKE 1 TABLET BY MOUTH EVERY DAY 90 tablet 1   b complex vitamins capsule Take 1 capsule by mouth daily.     budesonide-formoterol (SYMBICORT) 80-4.5 MCG/ACT inhaler Inhale 2 puffs into the lungs in the morning and at bedtime. 1 each 12   Cholecalciferol (VITAMIN D3) 125 MCG (5000 UT) CAPS Take 1 capsule by mouth daily.     fish oil-omega-3 fatty acids 1000 MG capsule Take 2 g by mouth daily.     fluticasone (FLONASE) 50 MCG/ACT nasal spray Place 2 sprays into both nostrils daily. 16 g 6   losartan (COZAAR) 50 MG tablet Take 1 tablet (50 mg total) by mouth daily. 90 tablet 3   No current facility-administered medications on file prior to visit.     Social History   Tobacco Use   Smoking status: Never   Smokeless tobacco: Never  Substance Use Topics   Alcohol use: Yes    Comment: occasional wine   Drug use: No    Review of Systems  Constitutional:  Negative for chills and fever.  Respiratory:  Negative for cough.   Cardiovascular:  Negative for chest pain and palpitations.  Gastrointestinal:  Negative for nausea and vomiting.     Objective:    BP 120/72    Pulse 85    Temp 98.1 F (36.7 C) (Oral)    Ht 5\' 3"  (1.6 m)    Wt 187 lb 9.6 oz (85.1 kg)    SpO2 97%    BMI 33.23 kg/m  BP Readings from Last 3 Encounters:  03/06/21 120/72  10/21/20 130/90  07/18/20 140/90   Wt Readings from Last 3 Encounters:  03/06/21 187 lb 9.6 oz (85.1 kg)  10/21/20 185 lb 9.6 oz (84.2 kg)  07/18/20 190 lb 3.2 oz (86.3 kg)    Physical Exam Vitals reviewed.  Constitutional:      Appearance: She is well-developed.  Eyes:     Conjunctiva/sclera: Conjunctivae  normal.  Cardiovascular:     Rate and Rhythm: Normal rate and regular rhythm.     Pulses: Normal pulses.     Heart sounds: Normal heart sounds.  Pulmonary:     Effort: Pulmonary effort is normal.     Breath sounds: Normal breath sounds. No wheezing, rhonchi or rales.  Skin:    General: Skin is warm and dry.  Neurological:     Mental Status: She is alert.  Psychiatric:        Speech: Speech normal.        Behavior: Behavior normal.        Thought Content: Thought content normal.       Assessment & Plan:   Problem List Items Addressed This Visit       Cardiovascular and Mediastinum   Hypertension    Chronic, stable.  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  Congratulated patient on hard work.  We will continue to monitor A1c every 3 or 4 months for now. Once stable, will check twice per year.        Relevant Orders   POCT HgB A1C (Completed)     I have discontinued Diamond Hill's rosuvastatin. I am also having her maintain her Vitamin D3, fish oil-omega-3 fatty acids, losartan, budesonide-formoterol, fluticasone, b complex vitamins, and amLODipine.   No orders of the defined types were placed in this encounter.   Return precautions given.   Risks, benefits, and alternatives of the medications and treatment plan prescribed today were discussed, and patient expressed understanding.   Education regarding symptom management and diagnosis given to patient on AVS.  Continue to follow with Diamond Hawthorne, FNP for routine health maintenance.   Diamond Hill and I agreed with plan.   Diamond Paris, FNP

## 2021-03-06 NOTE — Assessment & Plan Note (Signed)
Lab Results  Component Value Date   HGBA1C 6.0 (A) 03/06/2021   Improved.  Congratulated patient on hard work.  We will continue to monitor A1c every 3 or 4 months for now. Once stable, will check twice per year.

## 2021-03-31 ENCOUNTER — Ambulatory Visit (INDEPENDENT_AMBULATORY_CARE_PROVIDER_SITE_OTHER): Payer: Medicare HMO | Admitting: Nurse Practitioner

## 2021-03-31 ENCOUNTER — Encounter: Payer: Self-pay | Admitting: Nurse Practitioner

## 2021-03-31 VITALS — Temp 98.1°F

## 2021-03-31 DIAGNOSIS — J069 Acute upper respiratory infection, unspecified: Secondary | ICD-10-CM

## 2021-03-31 DIAGNOSIS — J4521 Mild intermittent asthma with (acute) exacerbation: Secondary | ICD-10-CM | POA: Insufficient documentation

## 2021-03-31 DIAGNOSIS — R051 Acute cough: Secondary | ICD-10-CM

## 2021-03-31 MED ORDER — BENZONATATE 100 MG PO CAPS: 100.0000 mg | ORAL_CAPSULE | Freq: Three times a day (TID) | ORAL | 0 refills | Status: AC | PRN

## 2021-03-31 MED ORDER — AZITHROMYCIN 250 MG PO TABS: ORAL_TABLET | ORAL | 0 refills | Status: AC

## 2021-03-31 MED ORDER — PREDNISONE 20 MG PO TABS: 20.0000 mg | ORAL_TABLET | Freq: Two times a day (BID) | ORAL | 0 refills | Status: AC

## 2021-03-31 NOTE — Assessment & Plan Note (Signed)
We will start patient on Tessalon Perles 100 mg 3 times daily as needed cough.

## 2021-03-31 NOTE — Progress Notes (Addendum)
Patient ID: Diamond Hill, female    DOB: 06-Oct-1950, 71 y.o.   MRN: [id number]  Virtual visit completed through [employer], a video enabled telemedicine application. Due to national recommendations of social distancing due to COVID-19, a virtual visit is felt to be most appropriate for this patient at this time. Reviewed limitations, risks, security and privacy concerns of performing a virtual visit and the availability of in person appointments. I also reviewed that there may be a patient responsible charge related to this service. The patient agreed to proceed.   Attempted to connect via video enabled program but was unsuccessful. Reverted to telephone encounter  Phone call lasted 8 mins and 9 seconds  Patient location: home Provider location: [place] at [facility] [place], office Persons participating in this virtual visit: patient, provider   If any vitals were documented, they were collected by patient at home unless specified below.    Temp 98.1 F (36.7 C) Comment: per patient this morning   CC: Cough Subjective:   HPI: Diamond Hill is a 71 y.o. female presenting on 03/31/2021 for Cough (Started on 03/28/21-got worse on 03/29/21, has to set up at night because of so much coughing  and coughing up yellowish/phlegm, has some wheezing, SOB. Covid test negative at home on 03/30/21. No fever or body aches, no sore throat. Has not tried anything for cough except the regular Symbicort inhaler use/)  Symptoms on 03/28/2021 Covid test was negative yesterday  Moderna x2 and 2 boosters Granddaughter was sneezing and coughing  No OTC Has used honey and tea with out great relief      Relevant past medical, surgical, family and social history reviewed and updated as indicated. Interim medical history since our last visit reviewed. Allergies and medications reviewed and updated. Outpatient Medications Prior to Visit  Medication Sig Dispense Refill   amLODipine (NORVASC) 10 MG tablet TAKE  1 TABLET BY MOUTH EVERY DAY 90 tablet 1   budesonide-formoterol (SYMBICORT) 80-4.5 MCG/ACT inhaler Inhale 2 puffs into the lungs in the morning and at bedtime. 1 each 12   fluticasone (FLONASE) 50 MCG/ACT nasal spray Place 2 sprays into both nostrils daily. 16 g 6   losartan (COZAAR) 50 MG tablet Take 1 tablet (50 mg total) by mouth daily. 90 tablet 3   b complex vitamins capsule Take 1 capsule by mouth daily. (Patient not taking: Reported on 03/31/2021)     Cholecalciferol (VITAMIN D3) 125 MCG (5000 UT) CAPS Take 1 capsule by mouth daily. (Patient not taking: Reported on 03/31/2021)     fish oil-omega-3 fatty acids 1000 MG capsule Take 2 g by mouth daily. (Patient not taking: Reported on 03/31/2021)     No facility-administered medications prior to visit.     Per HPI unless specifically indicated in ROS section below Review of Systems  Constitutional:  Positive for fatigue. Negative for chills and fever.  HENT:  Positive for ear pain (full). Negative for congestion, ear discharge, sinus pressure, sinus pain and sore throat.   Respiratory:  Positive for cough (thick yellowish) and shortness of breath (doe).   Cardiovascular:  Negative for chest pain.  Gastrointestinal:  Negative for abdominal pain, diarrhea, nausea and vomiting.  Neurological:  Positive for light-headedness. Negative for dizziness and headaches.  Objective:  Temp 98.1 F (36.7 C) Comment: per patient this morning  Wt Readings from Last 3 Encounters:  03/06/21 187 lb 9.6 oz (85.1 kg)  10/21/20 185 lb 9.6 oz (84.2 kg)  07/18/20 190 lb 3.2  oz (86.3 kg)       Physical exam: Gen: alert, NAD, not ill appearing Pulm: speaks in complete sentences without increased work of breathing Psych: normal mood, normal thought content      Results for orders placed or performed in visit on 03/06/21  POCT HgB A1C  Result Value Ref Range   Hemoglobin A1C 6.0 (A) 4.0 - 5.6 %   HbA1c POC (<> result, manual entry)     HbA1c, POC  (prediabetic range)     HbA1c, POC (controlled diabetic range)     Assessment & Plan:   Problem List Items Addressed This Visit       Respiratory   Viral upper respiratory tract infection - Primary    COVID test was negative.  Likely call upper respiratory infection from granddaughter who had a cold.  We will treat for acute asthma exacerbation      Relevant Medications   benzonatate (TESSALON) 100 MG capsule   azithromycin (ZITHROMAX) 250 MG tablet   Mild intermittent asthma with exacerbation    Patient currently on Symbicort for wheezing.  Patient was non-smoker chest x-ray did not show any signs of emphysema/COPD.  Patient likely has URI that has exacerbated her asthma will cover with azithromycin to 50 mg pack take as directed.  Prednisone 40 mg daily for 5 days.  Discussed signs and symptoms when to seek urgent or emergent health care.      Relevant Medications   azithromycin (ZITHROMAX) 250 MG tablet   predniSONE (DELTASONE) 20 MG tablet     Other   Acute cough    We will start patient on Tessalon Perles 100 mg 3 times daily as needed cough.      Relevant Medications   benzonatate (TESSALON) 100 MG capsule     Meds ordered this encounter  Medications   benzonatate (TESSALON) 100 MG capsule    Sig: Take 1 capsule (100 mg total) by mouth 3 (three) times daily as needed for up to 10 days for cough.    Dispense:  30 capsule    Refill:  0    Order Specific Question:   Supervising Provider    Answer:   Loura Pardon A [[id number]]   azithromycin (ZITHROMAX) 250 MG tablet    Sig: Take 2 tablets on day 1, then 1 tablet daily on days 2 through 5    Dispense:  6 tablet    Refill:  0    Order Specific Question:   Supervising Provider    Answer:   Loura Pardon A [[id number]]   predniSONE (DELTASONE) 20 MG tablet    Sig: Take 1 tablet (20 mg total) by mouth 2 (two) times daily with a meal for 5 days.    Dispense:  10 tablet    Refill:  0    Order Specific Question:   Supervising  Provider    Answer:   TOWER, MARNE A [[id number]]   No orders of the defined types were placed in this encounter.   I discussed the assessment and treatment plan with the patient. The patient was provided an opportunity to ask questions and all were answered. The patient agreed with the plan and demonstrated an understanding of the instructions. The patient was advised to call back or seek an in-person evaluation if the symptoms worsen or if the condition fails to improve as anticipated.  Follow up plan: No follow-ups on file.  Romilda Garret, NP

## 2021-03-31 NOTE — Assessment & Plan Note (Signed)
COVID test was negative.  Likely call upper respiratory infection from granddaughter who had a cold.  We will treat for acute asthma exacerbation

## 2021-03-31 NOTE — Assessment & Plan Note (Signed)
Patient currently on Symbicort for wheezing.  Patient was non-smoker chest x-ray did not show any signs of emphysema/COPD.  Patient likely has URI that has exacerbated her asthma will cover with azithromycin to 50 mg pack take as directed.  Prednisone 40 mg daily for 5 days.  Discussed signs and symptoms when to seek urgent or emergent health care.

## 2021-04-08 ENCOUNTER — Ambulatory Visit (INDEPENDENT_AMBULATORY_CARE_PROVIDER_SITE_OTHER): Payer: Medicare HMO | Admitting: Adult Health

## 2021-04-08 ENCOUNTER — Encounter: Payer: Self-pay | Admitting: Adult Health

## 2021-04-08 ENCOUNTER — Other Ambulatory Visit: Payer: Self-pay

## 2021-04-08 VITALS — BP 139/72 | HR 61 | Temp 98.1°F | Resp 14 | Ht 63.0 in | Wt 185.6 lb

## 2021-04-08 DIAGNOSIS — J029 Acute pharyngitis, unspecified: Secondary | ICD-10-CM

## 2021-04-08 DIAGNOSIS — H6693 Otitis media, unspecified, bilateral: Secondary | ICD-10-CM | POA: Diagnosis not present

## 2021-04-08 LAB — POCT RAPID STREP A (OFFICE): Rapid Strep A Screen: NEGATIVE

## 2021-04-08 LAB — POC COVID19 BINAXNOW: SARS Coronavirus 2 Ag: NEGATIVE

## 2021-04-08 MED ORDER — AMOXICILLIN-POT CLAVULANATE 875-125 MG PO TABS: 1.0000 | ORAL_TABLET | Freq: Two times a day (BID) | ORAL | 0 refills | Status: DC

## 2021-04-08 NOTE — Patient Instructions (Signed)
Amoxicillin; Clavulanic Acid Tablets What is this medication? AMOXICILLIN; CLAVULANIC ACID (a mox i SIL in; KLAV yoo lan ic AS id) treats infections caused by bacteria. It belongs to a group of medications called penicillin antibiotics. It will not treat colds, the flu, or infections caused by viruses. This medicine may be used for other purposes; ask your health care provider or pharmacist if you have questions. COMMON BRAND NAME(S): Augmentin What should I tell my care team before I take this medication? They need to know if you have any of these conditions: Kidney disease Liver disease Mononucleosis Stomach or intestine problems such as colitis An unusual or allergic reaction to amoxicillin, other penicillin or cephalosporin antibiotics, clavulanic acid, other medications, foods, dyes, or preservatives Pregnant or trying to get pregnant Breast-feeding How should I use this medication? Take this medication by mouth. Take it as directed on the prescription label at the same time every day. Take it with food at the start of a meal or snack. Take all of this medication unless your care team tells you to stop it early. Keep taking it even if you think you are better. Talk to your care team about the use of this medication in children. While it may be prescribed for selected conditions, precautions do apply. Overdosage: If you think you have taken too much of this medicine contact a poison control center or emergency room at once. NOTE: This medicine is only for you. Do not share this medicine with others. What if I miss a dose? If you miss a dose, take it as soon as you can. If it is almost time for your next dose, take only that dose. Do not take double or extra doses. What may interact with this medication? Allopurinol Anticoagulants Birth control pills Methotrexate Probenecid This list may not describe all possible interactions. Give your health care provider a list of all the medicines,  herbs, non-prescription drugs, or dietary supplements you use. Also tell them if you smoke, drink alcohol, or use illegal drugs. Some items may interact with your medicine. What should I watch for while using this medication? Tell your care team if your symptoms do not start to get better or if they get worse. This medication may cause serious skin reactions. They can happen weeks to months after starting the medication. Contact your care team right away if you notice fevers or flu-like symptoms with a rash. The rash may be red or purple and then turn into blisters or peeling of the skin. Or, you might notice a red rash with swelling of the face, lips or lymph nodes in your neck or under your arms. Do not treat diarrhea with over the counter products. Contact your care team if you have diarrhea that lasts more than 2 days or if it is severe and watery. If you have diabetes, you may get a false-positive result for sugar in your urine. Check with your care team. Birth control may not work properly while you are taking this medication. Talk to your care team about using an extra method of birth control. What side effects may I notice from receiving this medication? Side effects that you should report to your care team as soon as possible: Allergic reactions--skin rash, itching, hives, swelling of the face, lips, tongue, or throat Liver injury--right upper belly pain, loss of appetite, nausea, light-colored stool, dark yellow or brown urine, yellowing skin or eyes, unusual weakness or fatigue Redness, blistering, peeling, or loosening of the skin, including inside  the mouth Severe diarrhea, fever Unusual vaginal discharge, itching, or odor Side effects that usually do not require medical attention (report to your care team if they continue or are bothersome): Diarrhea Nausea Vomiting This list may not describe all possible side effects. Call your doctor for medical advice about side effects. You may  report side effects to FDA at 1-800-FDA-1088. Where should I keep my medication? Keep out of the reach of children and pets. Store at room temperature between 20 and 25 degrees C (68 and 77 degrees F). Throw away any unused medication after the expiration date. NOTE: This sheet is a summary. It may not cover all possible information. If you have questions about this medicine, talk to your doctor, pharmacist, or health care provider.  2022 Elsevier/Gold Standard (2020-02-17 00:00:00) Otitis Media, Adult Otitis media occurs when there is inflammation and fluid in the middle ear with signs and symptoms of an acute infection. The middle ear is a part of the ear that contains bones for hearing as well as air that helps send sounds to the brain. When infected fluid builds up in this space, it causes pressure and can lead to an ear infection. The eustachian tube connects the middle ear to the back of the nose (nasopharynx) and normally allows air into the middle ear. If the eustachian tube becomes blocked, fluid can build up and become infected. What are the causes? This condition is caused by a blockage in the eustachian tube. This can be caused by mucus or by swelling of the tube. Problems that can cause a blockage include: A cold or other upper respiratory infection. Allergies. An irritant, such as tobacco smoke. Enlarged adenoids. The adenoids are areas of soft tissue located high in the back of the throat, behind the nose and the roof of the mouth. They are part of the body's defense system (immune system). A mass in the nasopharynx. Damage to the ear caused by pressure changes (barotrauma). What increases the risk? You are more likely to develop this condition if you: Smoke or are exposed to tobacco smoke. Have an opening in the roof of your mouth (cleft palate). Have gastroesophageal reflux. Have an immune system disorder. What are the signs or symptoms? Symptoms of this condition  include: Ear pain. Fever. Decreased hearing. Tiredness (lethargy). Fluid leaking from the ear, if the eardrum is ruptured or has burst. Ringing in the ear. How is this diagnosed? This condition is diagnosed with a physical exam. During the exam, your health care provider will use an instrument called an otoscope to look in your ear and check for redness, swelling, and fluid. He or she will also ask about your symptoms. Your health care provider may also order tests, such as: A pneumatic otoscopy. This is a test to check the movement of the eardrum. It is done by squeezing a small amount of air into the ear. A tympanogram. This is a test that shows how well the eardrum moves in response to air pressure in the ear canal. It provides a graph for your health care provider to review. How is this treated? This condition can go away on its own within 3-5 days. But if the condition is caused by a bacterial infection and does not go away on its own, or if it keeps coming back, your health care provider may: Prescribe antibiotic medicine to treat the infection. Prescribe or recommend medicines to control pain. Follow these instructions at home: Take over-the-counter and prescription medicines only as told  by your health care provider. If you were prescribed an antibiotic medicine, take it as told by your health care provider. Do not stop taking the antibiotic even if you start to feel better. Keep all follow-up visits. This is important. Contact a health care provider if: You have bleeding from your nose. There is a lump on your neck. You are not feeling better in 5 days. You feel worse instead of better. Get help right away if: You have severe pain that is not controlled with medicine. You have swelling, redness, or pain around your ear. You have stiffness in your neck. A part of your face is not moving (paralyzed). The bone behind your ear (mastoid bone) is tender when you touch it. You  develop a severe headache. Summary Otitis media is redness, soreness, and swelling of the middle ear, usually resulting in pain and decreased hearing. This condition can go away on its own within 3-5 days. If the problem does not go away in 3-5 days, your health care provider may give you medicines to treat the infection. If you were prescribed an antibiotic medicine, take it as told by your health care provider. Follow all instructions that were given to you by your health care provider. This information is not intended to replace advice given to you by your health care provider. Make sure you discuss any questions you have with your health care provider. Document Revised: 06/02/2020 Document Reviewed: 06/02/2020 Elsevier Patient Education  [employer].

## 2021-04-08 NOTE — Progress Notes (Signed)
Patient aware in office negative covid/ strep patient aware in office.

## 2021-04-08 NOTE — Progress Notes (Signed)
Acute Office Visit  Subjective:    Patient ID: Diamond Hill, female    DOB: 10-Jul-1950, 71 y.o.   MRN: [id number]  Chief Complaint  Patient presents with   Acute Visit    Will like to discuss about sore throat and ear fullness.    Otalgia  This is a new problem. The current episode started 1 to 4 weeks ago (ear pain over 2 weeks both ears.). The problem occurs every few minutes. The problem has been gradually worsening. There has been no fever. The pain is moderate. Associated symptoms include a sore throat. Pertinent negatives include no abdominal pain, coughing, diarrhea, ear discharge, headaches, hearing loss, neck pain, rash, rhinorrhea or vomiting. She has tried nothing for the symptoms. There is no history of a chronic ear infection or hearing loss.  She had a z- pack and benzonatate, prednisone dose pack for a URI about a week ago and all symptoms resolved other than some mild post nasal drip at night. Throat is sore.  Cough has improved  resolved ,non productive. Denies any abdominal or back pain.   Patient  denies any fever, body aches,chills, rash, chest pain, shortness of breath, nausea, vomiting, or diarrhea.  Denies dizziness, lightheadedness, pre syncopal or syncopal episodes.    Past Medical History:  Diagnosis Date   Blood in stool    Cancer [facility]) 2005   rectal, Dr. Claybon Jabs, Dr. Mallie Snooks, Dr. Leamon Arnt, s/p XRT   Colon polyp    Diverticulitis    Heart murmur    Hypertension     Past Surgical History:  Procedure Laterality Date   ABDOMINAL HYSTERECTOMY  1993   HAS CERVIX; ovaries intact; hysterectomy for non cancercous reasons, due to fibroid.    rectal cancer  2005   removal of lesion    Family History  Problem Relation Age of Onset   Cancer Mother        colon   Hyperlipidemia Mother    Hypertension Mother    Hyperlipidemia Father    Hypertension Father    Diabetes Father    Heart disease Father    Hypertension Other    Breast cancer Neg Hx      Social History   Socioeconomic History   Marital status: Divorced    Spouse name: Not on file   Number of children: Not on file   Years of education: Not on file   Highest education level: Not on file  Occupational History   Not on file  Tobacco Use   Smoking status: Never   Smokeless tobacco: Never  Substance and Sexual Activity   Alcohol use: Yes    Comment: occasional wine   Drug use: No   Sexual activity: Not on file  Other Topics Concern   Not on file  Social History Narrative   Lives in [place]. Separate from husband. 2 children.      Work - retired   Diet - Healthy   Exercise - none regular   Social Determinants of Radio broadcast assistant Strain: Low Risk    Difficulty of Paying Living Expenses: Not hard at all  Food Insecurity: No Food Insecurity   Worried About Charity fundraiser in the Last Year: Never true   Arboriculturist in the Last Year: Never true  Transportation Needs: No Transportation Needs   Lack of Transportation (Medical): No   Lack of Transportation (Non-Medical): No  Physical Activity: Unknown   Days of Exercise per  Week: 0 days   Minutes of Exercise per Session: Not on file  Stress: No Stress Concern Present   Feeling of Stress : Not at all  Social Connections: Not on file  Intimate Partner Violence: Not At Risk   Fear of Current or Ex-Partner: No   Emotionally Abused: No   Physically Abused: No   Sexually Abused: No    Outpatient Medications Prior to Visit  Medication Sig Dispense Refill   amLODipine (NORVASC) 10 MG tablet TAKE 1 TABLET BY MOUTH EVERY DAY 90 tablet 1   benzonatate (TESSALON) 100 MG capsule Take 1 capsule (100 mg total) by mouth 3 (three) times daily as needed for up to 10 days for cough. 30 capsule 0   budesonide-formoterol (SYMBICORT) 80-4.5 MCG/ACT inhaler Inhale 2 puffs into the lungs in the morning and at bedtime. 1 each 12   Cholecalciferol (VITAMIN D3) 125 MCG (5000 UT) CAPS Take 1 capsule by mouth  daily.     fish oil-omega-3 fatty acids 1000 MG capsule Take 2 g by mouth daily.     fluticasone (FLONASE) 50 MCG/ACT nasal spray Place 2 sprays into both nostrils daily. 16 g 6   losartan (COZAAR) 50 MG tablet Take 1 tablet (50 mg total) by mouth daily. 90 tablet 3   b complex vitamins capsule Take 1 capsule by mouth daily. (Patient not taking: Reported on 03/31/2021)     No facility-administered medications prior to visit.    No Known Allergies  Review of Systems  HENT:  Positive for ear pain and sore throat. Negative for congestion, dental problem, drooling, ear discharge, facial swelling, hearing loss, mouth sores, nosebleeds, postnasal drip, rhinorrhea, sinus pressure, sinus pain, sneezing, tinnitus, trouble swallowing and voice change.   Respiratory: Negative.  Negative for apnea, cough, choking, chest tightness, shortness of breath, wheezing and stridor.   Cardiovascular: Negative.   Gastrointestinal: Negative.  Negative for abdominal pain, diarrhea and vomiting.  Genitourinary: Negative.   Musculoskeletal: Negative.  Negative for neck pain.  Skin:  Negative for rash.  Neurological: Negative.  Negative for headaches.  Psychiatric/Behavioral: Negative.        Objective:    Physical Exam Constitutional:      Appearance: Normal appearance. She is well-developed. She is not ill-appearing or diaphoretic.  HENT:     Head: Normocephalic and atraumatic.     Jaw: There is normal jaw occlusion.     Right Ear: Ear canal normal. A middle ear effusion is present. No mastoid tenderness. Tympanic membrane is injected and erythematous. Tympanic membrane is not perforated or bulging.     Left Ear: Ear canal normal. A middle ear effusion is present. No mastoid tenderness. Tympanic membrane is injected. Tympanic membrane is not perforated, erythematous or bulging.     Ears:     Comments: Right partial tympanic membrane visualized is dark  fluid behind tympanic membrane and dull, cerumen impeding  view, part viewed is intact. Soft cerumen.       Nose: Nose normal. No nasal tenderness, mucosal edema, congestion or rhinorrhea.     Right Turbinates: Not enlarged or swollen.     Left Turbinates: Not enlarged or swollen.     Right Sinus: No maxillary sinus tenderness or frontal sinus tenderness.     Left Sinus: No maxillary sinus tenderness or frontal sinus tenderness.     Mouth/Throat:     Lips: Pink.     Mouth: Mucous membranes are moist.     Tongue: No lesions. Tongue does not  deviate from midline.     Pharynx: Posterior oropharyngeal erythema present. No oropharyngeal exudate.     Tonsils: Tonsillar exudate present. No tonsillar abscesses. 1+ on the right. 1+ on the left.     Comments: No blisters. Oral pharynx patent, no edema.  Eyes:     General: Lids are normal. Vision grossly intact.     Conjunctiva/sclera:     Right eye: Right conjunctiva is not injected.     Left eye: Left conjunctiva is not injected.     Pupils: Pupils are equal, round, and reactive to light.  Cardiovascular:     Rate and Rhythm: Normal rate and regular rhythm.     Pulses: Normal pulses.     Heart sounds: Normal heart sounds. No murmur heard.   No friction rub. No gallop.  Pulmonary:     Effort: Pulmonary effort is normal. No respiratory distress.     Breath sounds: Normal breath sounds and air entry. No stridor, decreased air movement or transmitted upper airway sounds. No decreased breath sounds, wheezing, rhonchi or rales.  Chest:     Chest wall: No tenderness.  Abdominal:     Palpations: Abdomen is soft.  Musculoskeletal:     Cervical back: Normal range of motion and neck supple.  Skin:    General: Skin is warm.     Findings: No erythema or rash.  Neurological:     Mental Status: She is alert and oriented to person, place, and time.     Gait: Gait normal.  Psychiatric:        Mood and Affect: Mood normal.        Behavior: Behavior normal.        Thought Content: Thought content normal.         Judgment: Judgment normal.    BP 139/72    Pulse 61    Temp 98.1 F (36.7 C) (Oral)    Resp 14    Ht 5\' 3"  (1.6 m)    Wt 185 lb 9.6 oz (84.2 kg)    SpO2 98%    BMI 32.88 kg/m  Wt Readings from Last 3 Encounters:  04/08/21 185 lb 9.6 oz (84.2 kg)  03/06/21 187 lb 9.6 oz (85.1 kg)  10/21/20 185 lb 9.6 oz (84.2 kg)    Health Maintenance Due  Topic Date Due   COVID-19 Vaccine (5 - Booster for Moderna series) 12/05/2020    There are no preventive care reminders to display for this patient.   Lab Results  Component Value Date   TSH 2.74 09/25/2019   Lab Results  Component Value Date   WBC 7.3 09/25/2019   HGB 14.0 09/25/2019   HCT 41.8 09/25/2019   MCV 92.0 09/25/2019   PLT 276.0 09/25/2019   Lab Results  Component Value Date   NA 143 10/21/2020   K 3.8 10/21/2020   CO2 29 10/21/2020   GLUCOSE 106 (H) 10/21/2020   BUN 18 10/21/2020   CREATININE 0.81 10/21/2020   BILITOT 0.5 10/21/2020   ALKPHOS 60 10/21/2020   AST 14 10/21/2020   ALT 16 10/21/2020   PROT 7.7 10/21/2020   ALBUMIN 4.4 10/21/2020   CALCIUM 9.8 10/21/2020   GFR 74.75 10/21/2020   Lab Results  Component Value Date   CHOL 199 10/21/2020   Lab Results  Component Value Date   HDL 99.40 10/21/2020   Lab Results  Component Value Date   LDLCALC 87 10/21/2020   Lab Results  Component Value Date  TRIG 65.0 10/21/2020   Lab Results  Component Value Date   CHOLHDL 2 10/21/2020   Lab Results  Component Value Date   HGBA1C 6.0 (A) 03/06/2021       Assessment & Plan:   Problem List Items Addressed This Visit   None Visit Diagnoses     Acute otitis media, bilateral    -  Primary   Relevant Medications   amoxicillin-clavulanate (AUGMENTIN) 875-125 MG tablet   Sore throat       Relevant Orders   POCT rapid strep A (Completed)   POC COVID-19 BinaxNow (Completed)      POCT strep/ covid negative  Meds ordered this encounter  Medications   amoxicillin-clavulanate (AUGMENTIN)  875-125 MG tablet    Sig: Take 1 tablet by mouth 2 (two) times daily.    Dispense:  20 tablet    Refill:  0   Discussed probiotics or yogurt while on antibiotic and at least one week post. Discussed side effects of medication.    Red Flags discussed. The patient was given clear instructions to go to ER or return to medical center if any red flags develop, symptoms do not improve, worsen or new problems develop. They verbalized understanding.  Return if symptoms worsen or fail to improve, for at any time for any worsening symptoms, Go to Emergency room/ urgent care if worse.   Marcille Buffy, FNP

## 2021-04-10 DIAGNOSIS — H6690 Otitis media, unspecified, unspecified ear: Secondary | ICD-10-CM | POA: Diagnosis not present

## 2021-04-10 DIAGNOSIS — G8929 Other chronic pain: Secondary | ICD-10-CM | POA: Diagnosis not present

## 2021-04-10 DIAGNOSIS — E119 Type 2 diabetes mellitus without complications: Secondary | ICD-10-CM | POA: Diagnosis not present

## 2021-04-10 DIAGNOSIS — Z6834 Body mass index (BMI) 34.0-34.9, adult: Secondary | ICD-10-CM | POA: Diagnosis not present

## 2021-04-10 DIAGNOSIS — Z7951 Long term (current) use of inhaled steroids: Secondary | ICD-10-CM | POA: Diagnosis not present

## 2021-04-10 DIAGNOSIS — Z79899 Other long term (current) drug therapy: Secondary | ICD-10-CM | POA: Diagnosis not present

## 2021-04-10 DIAGNOSIS — J45909 Unspecified asthma, uncomplicated: Secondary | ICD-10-CM | POA: Diagnosis not present

## 2021-04-10 DIAGNOSIS — I10 Essential (primary) hypertension: Secondary | ICD-10-CM | POA: Diagnosis not present

## 2021-04-10 DIAGNOSIS — Z809 Family history of malignant neoplasm, unspecified: Secondary | ICD-10-CM | POA: Diagnosis not present

## 2021-04-10 DIAGNOSIS — Z008 Encounter for other general examination: Secondary | ICD-10-CM | POA: Diagnosis not present

## 2021-04-10 DIAGNOSIS — E669 Obesity, unspecified: Secondary | ICD-10-CM | POA: Diagnosis not present

## 2021-04-10 DIAGNOSIS — Z7722 Contact with and (suspected) exposure to environmental tobacco smoke (acute) (chronic): Secondary | ICD-10-CM | POA: Diagnosis not present

## 2021-04-10 DIAGNOSIS — R69 Illness, unspecified: Secondary | ICD-10-CM | POA: Diagnosis not present

## 2021-04-21 ENCOUNTER — Ambulatory Visit (INDEPENDENT_AMBULATORY_CARE_PROVIDER_SITE_OTHER): Payer: Medicare HMO

## 2021-04-21 VITALS — Ht 63.0 in | Wt 185.0 lb

## 2021-04-21 DIAGNOSIS — Z0000 Encounter for general adult medical examination without abnormal findings: Secondary | ICD-10-CM

## 2021-04-21 NOTE — Patient Instructions (Addendum)
Diamond Hill , Thank you for taking time to come for your Medicare Wellness Visit. I appreciate your ongoing commitment to your health goals. Please review the following plan we discussed and let me know if I can assist you in the future.   These are the goals we discussed:  Goals       Patient Stated     DIET - REDUCE SUGAR INTAKE (pt-stated)      Portion control Maintain weight  Stay active        This is a list of the screening recommended for you and due dates:  Health Maintenance  Topic Date Due   COVID-19 Vaccine (5 - Booster for Moderna series) 12/05/2020   Flu Shot  06/05/2021*   Complete foot exam   05/26/2021   Hemoglobin A1C  09/04/2021   Eye exam for diabetics  01/26/2022   Tetanus Vaccine  10/13/2022   Mammogram  10/28/2022   Colon Cancer Screening  08/02/2023   DEXA scan (bone density measurement)  Completed   Hepatitis C Screening: USPSTF Recommendation to screen - Ages 31-79 yo.  Completed   HPV Vaccine  Aged Out   Pneumonia Vaccine  Discontinued   Zoster (Shingles) Vaccine  Discontinued  *Topic was postponed. The date shown is not the original due date.   Advanced directives: not yet completed  Conditions/risks identified: none new  Follow up in one year for your annual wellness visit    Preventive Care 65 Years and Older, Female Preventive care refers to lifestyle choices and visits with your health care provider that can promote health and wellness. What does preventive care include? A yearly physical exam. This is also called an annual well check. Dental exams once or twice a year. Routine eye exams. Ask your health care provider how often you should have your eyes checked. Personal lifestyle choices, including: Daily care of your teeth and gums. Regular physical activity. Eating a healthy diet. Avoiding tobacco and drug use. Limiting alcohol use. Practicing safe sex. Taking low-dose aspirin every day. Taking vitamin and mineral supplements  as recommended by your health care provider. What happens during an annual well check? The services and screenings done by your health care provider during your annual well check will depend on your age, overall health, lifestyle risk factors, and family history of disease. Counseling  Your health care provider may ask you questions about your: Alcohol use. Tobacco use. Drug use. Emotional well-being. Home and relationship well-being. Sexual activity. Eating habits. History of falls. Memory and ability to understand (cognition). Work and work Statistician. Reproductive health. Screening  You may have the following tests or measurements: Height, weight, and BMI. Blood pressure. Lipid and cholesterol levels. These may be checked every 5 years, or more frequently if you are over 38 years old. Skin check. Lung cancer screening. You may have this screening every year starting at age 50 if you have a 30-pack-year history of smoking and currently smoke or have quit within the past 15 years. Fecal occult blood test (FOBT) of the stool. You may have this test every year starting at age 33. Flexible sigmoidoscopy or colonoscopy. You may have a sigmoidoscopy every 5 years or a colonoscopy every 10 years starting at age 46. Hepatitis C blood test. Hepatitis B blood test. Sexually transmitted disease (STD) testing. Diabetes screening. This is done by checking your blood sugar (glucose) after you have not eaten for a while (fasting). You may have this done every 1-3 years. Bone density scan.  This is done to screen for osteoporosis. You may have this done starting at age 13. Mammogram. This may be done every 1-2 years. Talk to your health care provider about how often you should have regular mammograms. Talk with your health care provider about your test results, treatment options, and if necessary, the need for more tests. Vaccines  Your health care provider may recommend certain vaccines, such  as: Influenza vaccine. This is recommended every year. Tetanus, diphtheria, and acellular pertussis (Tdap, Td) vaccine. You may need a Td booster every 10 years. Zoster vaccine. You may need this after age 8. Pneumococcal 13-valent conjugate (PCV13) vaccine. One dose is recommended after age 87. Pneumococcal polysaccharide (PPSV23) vaccine. One dose is recommended after age 24. Talk to your health care provider about which screenings and vaccines you need and how often you need them. This information is not intended to replace advice given to you by your health care provider. Make sure you discuss any questions you have with your health care provider. Document Released: 03/21/2015 Document Revised: 11/12/2015 Document Reviewed: 12/24/2014 Elsevier Interactive Patient Education  2017 [place] Prevention in the Home Falls can cause injuries. They can happen to people of all ages. There are many things you can do to make your home safe and to help prevent falls. What can I do on the outside of my home? Regularly fix the edges of walkways and driveways and fix any cracks. Remove anything that might make you trip as you walk through a door, such as a raised step or threshold. Trim any bushes or trees on the path to your home. Use bright outdoor lighting. Clear any walking paths of anything that might make someone trip, such as rocks or tools. Regularly check to see if handrails are loose or broken. Make sure that both sides of any steps have handrails. Any raised decks and porches should have guardrails on the edges. Have any leaves, snow, or ice cleared regularly. Use sand or salt on walking paths during winter. Clean up any spills in your garage right away. This includes oil or grease spills. What can I do in the bathroom? Use night lights. Install grab bars by the toilet and in the tub and shower. Do not use towel bars as grab bars. Use non-skid mats or decals in the tub or  shower. If you need to sit down in the shower, use a plastic, non-slip stool. Keep the floor dry. Clean up any water that spills on the floor as soon as it happens. Remove soap buildup in the tub or shower regularly. Attach bath mats securely with double-sided non-slip rug tape. Do not have throw rugs and other things on the floor that can make you trip. What can I do in the bedroom? Use night lights. Make sure that you have a light by your bed that is easy to reach. Do not use any sheets or blankets that are too big for your bed. They should not hang down onto the floor. Have a firm chair that has side arms. You can use this for support while you get dressed. Do not have throw rugs and other things on the floor that can make you trip. What can I do in the kitchen? Clean up any spills right away. Avoid walking on wet floors. Keep items that you use a lot in easy-to-reach places. If you need to reach something above you, use a strong step stool that has a grab bar. Keep electrical cords  out of the way. Do not use floor polish or wax that makes floors slippery. If you must use wax, use non-skid floor wax. Do not have throw rugs and other things on the floor that can make you trip. What can I do with my stairs? Do not leave any items on the stairs. Make sure that there are handrails on both sides of the stairs and use them. Fix handrails that are broken or loose. Make sure that handrails are as long as the stairways. Check any carpeting to make sure that it is firmly attached to the stairs. Fix any carpet that is loose or worn. Avoid having throw rugs at the top or bottom of the stairs. If you do have throw rugs, attach them to the floor with carpet tape. Make sure that you have a light switch at the top of the stairs and the bottom of the stairs. If you do not have them, ask someone to add them for you. What else can I do to help prevent falls? Wear shoes that: Do not have high heels. Have  rubber bottoms. Are comfortable and fit you well. Are closed at the toe. Do not wear sandals. If you use a stepladder: Make sure that it is fully opened. Do not climb a closed stepladder. Make sure that both sides of the stepladder are locked into place. Ask someone to hold it for you, if possible. Clearly mark and make sure that you can see: Any grab bars or handrails. First and last steps. Where the edge of each step is. Use tools that help you move around (mobility aids) if they are needed. These include: Canes. Walkers. Scooters. Crutches. Turn on the lights when you go into a dark area. Replace any light bulbs as soon as they burn out. Set up your furniture so you have a clear path. Avoid moving your furniture around. If any of your floors are uneven, fix them. If there are any pets around you, be aware of where they are. Review your medicines with your doctor. Some medicines can make you feel dizzy. This can increase your chance of falling. Ask your doctor what other things that you can do to help prevent falls. This information is not intended to replace advice given to you by your health care provider. Make sure you discuss any questions you have with your health care provider. Document Released: 12/19/2008 Document Revised: 07/31/2015 Document Reviewed: 03/29/2014 Elsevier Interactive Patient Education  2017 [employer].

## 2021-04-21 NOTE — Progress Notes (Signed)
Subjective:   Diamond Hill is a 71 y.o. female who presents for Medicare Annual (Subsequent) preventive examination.  Review of Systems    No ROS.  Medicare Wellness Virtual Visit.  Visual/audio telehealth visit, UTA vital signs.   See social history for additional risk factors.   Cardiac Risk Factors include: advanced age (>2men, >65 women);diabetes mellitus;hypertension     Objective:    Today's Vitals   04/21/21 1414  Weight: 185 lb (83.9 kg)  Height: 5\' 3"  (1.6 m)   Body mass index is 32.77 kg/m.  Advanced Directives 04/18/2020  Does Patient Have a Medical Advance Directive? No  Would patient like information on creating a medical advance directive? No - Patient declined   Current Medications (verified) Outpatient Encounter Medications as of 04/21/2021  Medication Sig   amLODipine (NORVASC) 10 MG tablet TAKE 1 TABLET BY MOUTH EVERY DAY   b complex vitamins capsule Take 1 capsule by mouth daily. (Patient not taking: Reported on 03/31/2021)   budesonide-formoterol (SYMBICORT) 80-4.5 MCG/ACT inhaler Inhale 2 puffs into the lungs in the morning and at bedtime.   Cholecalciferol (VITAMIN D3) 125 MCG (5000 UT) CAPS Take 1 capsule by mouth daily.   fish oil-omega-3 fatty acids 1000 MG capsule Take 2 g by mouth daily.   fluticasone (FLONASE) 50 MCG/ACT nasal spray Place 2 sprays into both nostrils daily.   losartan (COZAAR) 50 MG tablet Take 1 tablet (50 mg total) by mouth daily.   [DISCONTINUED] amoxicillin-clavulanate (AUGMENTIN) 875-125 MG tablet Take 1 tablet by mouth 2 (two) times daily.   No facility-administered encounter medications on file as of 04/21/2021.   Allergies (verified) Patient has no known allergies.   History: Past Medical History:  Diagnosis Date   Blood in stool    Cancer [facility]) 2005   rectal, Dr. Claybon Jabs, Dr. Mallie Snooks, Dr. Leamon Arnt, s/p XRT   Colon polyp    Diverticulitis    Heart murmur    Hypertension    Past Surgical History:  Procedure  Laterality Date   ABDOMINAL HYSTERECTOMY  1993   HAS CERVIX; ovaries intact; hysterectomy for non cancercous reasons, due to fibroid.    rectal cancer  2005   removal of lesion   Family History  Problem Relation Age of Onset   Cancer Mother        colon   Hyperlipidemia Mother    Hypertension Mother    Hyperlipidemia Father    Hypertension Father    Diabetes Father    Heart disease Father    Hypertension Other    Breast cancer Neg Hx    Social History   Socioeconomic History   Marital status: Divorced    Spouse name: Not on file   Number of children: Not on file   Years of education: Not on file   Highest education level: Not on file  Occupational History   Not on file  Tobacco Use   Smoking status: Never   Smokeless tobacco: Never  Substance and Sexual Activity   Alcohol use: Yes    Comment: occasional wine   Drug use: No   Sexual activity: Not on file  Other Topics Concern   Not on file  Social History Narrative   Lives in [place]. Separate from husband. 2 children.      Work - retired   Diet - Healthy   Exercise - none regular   Social Determinants of Radio broadcast assistant Strain: Low Risk    Difficulty of Paying  Living Expenses: Not hard at all  Food Insecurity: No Food Insecurity   Worried About [place] in the Last Year: Never true   Ran Out of Food in the Last Year: Never true  Transportation Needs: No Transportation Needs   Lack of Transportation (Medical): No   Lack of Transportation (Non-Medical): No  Physical Activity: Not on file  Stress: No Stress Concern Present   Feeling of Stress : Not at all  Social Connections: Unknown   Frequency of Communication with Friends and Family: More than three times a week   Frequency of Social Gatherings with Friends and Family: More than three times a week   Attends Religious Services: Not on Electrical engineer or Organizations: Not on file   Attends Archivist  Meetings: Not on file   Marital Status: Not on file    Tobacco Counseling Counseling given: Not Answered   Clinical Intake:  Pre-visit preparation completed: Yes        Diabetes: No  How often do you need to have someone help you when you read instructions, pamphlets, or other written materials from your doctor or pharmacy?: 1 - Never   Interpreter Needed?: No      Activities of Daily Living In your present state of health, do you have any difficulty performing the following activities: 04/21/2021  Hearing? N  Vision? N  Difficulty concentrating or making decisions? N  Walking or climbing stairs? N  Dressing or bathing? N  Doing errands, shopping? N  Preparing Food and eating ? N  Using the Toilet? N  In the past six months, have you accidently leaked urine? N  Do you have problems with loss of bowel control? N  Managing your Medications? N  Managing your Finances? N  Housekeeping or managing your Housekeeping? N  Some recent data might be hidden   Patient Care Team: Burnard Hawthorne, FNP as PCP - General (Family Medicine)  Indicate any recent Medical Services you may have received from other than [facility] providers in the past year (date may be approximate).     Assessment:   This is a routine wellness examination for [facility]  Virtual Visit via Telephone Note  I connected with  Diamond Hill on 04/21/21 at  2:00 PM EST by telephone and verified that I am speaking with the correct person using two identifiers.  Persons participating in the virtual visit: patient/Nurse Health Advisor   I discussed the limitations, risks, security and privacy concerns of performing an evaluation and management service by telephone and the availability of in person appointments. The patient expressed understanding and agreed to proceed.  Interactive audio and video telecommunications were attempted between this nurse and patient, however failed, due to patient having  technical difficulties OR patient did not have access to video capability.  We continued and completed visit with audio only.  Some vital signs may be absent or patient reported.   Hearing/Vision screen Hearing Screening - Comments:: Patient is able to hear conversational tones without difficulty.  No issues reported. Vision Screening - Comments:: Followed by Dr. Ellin Mayhew Wears corrective lenses   Dietary issues and exercise activities discussed: Current Exercise Habits: Home exercise routine, Type of exercise: walking, Intensity: Mild Healthy diet  Good water intake   Goals Addressed               This Visit's Progress     Patient Stated     DIET - REDUCE  SUGAR INTAKE (pt-stated)        Portion control Maintain weight  Stay active       Depression Screen PHQ 2/9 Scores 04/21/2021 04/08/2021 10/21/2020 07/18/2020 04/18/2020 01/30/2020 12/19/2019  PHQ - 2 Score 0 0 0 2 0 1 4  PHQ- 9 Score - - - 6 - 4 12    Fall Risk Fall Risk  04/21/2021 04/08/2021 10/21/2020 04/18/2020 09/17/2019  Falls in the past year? 0 0 0 0 0  Number falls in past yr: 0 0 - 0 0  Injury with Fall? - 0 - 0 0  Risk for fall due to : - No Fall Risks - - -  Follow up Falls evaluation completed Falls evaluation completed Falls evaluation completed Falls evaluation completed Falls evaluation completed   [place]: Home free of loose throw rugs in walkways, pet beds, electrical cords, etc? Yes  Adequate lighting in your home to reduce risk of falls? Yes   ASSISTIVE DEVICES UTILIZED TO PREVENT FALLS: Life alert? No  Use of a cane, walker or w/c? No   TIMED UP AND GO: Was the test performed? No .   Cognitive Function:  Patient is alert and oriented x3.   Enjoys crossword puzzles and other brain health stimulating games.   Immunizations Immunization History  Administered Date(s) Administered   Influenza Split 12/06/2013   Influenza-Unspecified 12/25/2012   Moderna  Sars-Covid-2 Vaccination 05/05/2019, 06/02/2019, 02/28/2020, 10/10/2020   Tdap 10/12/2012   Screening Tests Health Maintenance  Topic Date Due   COVID-19 Vaccine (5 - Booster for Moderna series) 12/05/2020   INFLUENZA VACCINE  06/05/2021 (Originally 10/06/2020)   FOOT EXAM  05/26/2021   HEMOGLOBIN A1C  09/04/2021   OPHTHALMOLOGY EXAM  01/26/2022   TETANUS/TDAP  10/13/2022   MAMMOGRAM  10/28/2022   COLONOSCOPY (Pts 45-65yrs Insurance coverage will need to be confirmed)  08/02/2023   DEXA SCAN  Completed   Hepatitis C Screening  Completed   HPV VACCINES  Aged Out   Pneumonia Vaccine 60+ Years old  Discontinued   Zoster Vaccines- Shingrix  Discontinued   Health Maintenance Health Maintenance Due  Topic Date Due   COVID-19 Vaccine (5 - Booster for Moderna series) 12/05/2020   Lung Cancer Screening: (Low Dose CT Chest recommended if Age 22-80 years, 30 pack-year currently smoking OR have quit w/in 15years.) does not qualify.   Vision Screening: Recommended annual ophthalmology exams for early detection of glaucoma and other disorders of the eye.  Dental Screening: Recommended annual dental exams for proper oral hygiene  Community Resource Referral / Chronic Care Management: CRR required this visit?  No   CCM required this visit?  No      Plan:   Keep all routine maintenance appointments.   I have personally reviewed and noted the following in the patients chart:   Medical and social history Use of alcohol, tobacco or illicit drugs  Current medications and supplements including opioid prescriptions. Not taking opioid.  Functional ability and status Nutritional status Physical activity Advanced directives List of other physicians Hospitalizations, surgeries, and ER visits in previous 12 months Vitals Screenings to include cognitive, depression, and falls Referrals and appointments  In addition, I have reviewed and discussed with patient certain preventive protocols,  quality metrics, and best practice recommendations. A written personalized care plan for preventive services as well as general preventive health recommendations were provided to patient via mychart.     Varney Biles, LPN   04/01/5807

## 2021-05-05 ENCOUNTER — Other Ambulatory Visit: Payer: Self-pay | Admitting: Family

## 2021-05-05 DIAGNOSIS — I10 Essential (primary) hypertension: Secondary | ICD-10-CM

## 2021-06-05 ENCOUNTER — Ambulatory Visit (INDEPENDENT_AMBULATORY_CARE_PROVIDER_SITE_OTHER): Payer: Medicare HMO

## 2021-06-05 ENCOUNTER — Encounter: Payer: Self-pay | Admitting: Family

## 2021-06-05 ENCOUNTER — Ambulatory Visit (INDEPENDENT_AMBULATORY_CARE_PROVIDER_SITE_OTHER): Payer: Medicare HMO | Admitting: Family

## 2021-06-05 VITALS — BP 130/78 | HR 86 | Temp 97.9°F | Ht 63.0 in | Wt 188.0 lb

## 2021-06-05 DIAGNOSIS — I10 Essential (primary) hypertension: Secondary | ICD-10-CM | POA: Diagnosis not present

## 2021-06-05 DIAGNOSIS — R062 Wheezing: Secondary | ICD-10-CM

## 2021-06-05 DIAGNOSIS — E118 Type 2 diabetes mellitus with unspecified complications: Secondary | ICD-10-CM

## 2021-06-05 DIAGNOSIS — R059 Cough, unspecified: Secondary | ICD-10-CM | POA: Diagnosis not present

## 2021-06-05 DIAGNOSIS — J4521 Mild intermittent asthma with (acute) exacerbation: Secondary | ICD-10-CM | POA: Diagnosis not present

## 2021-06-05 DIAGNOSIS — E559 Vitamin D deficiency, unspecified: Secondary | ICD-10-CM | POA: Diagnosis not present

## 2021-06-05 LAB — CBC WITH DIFFERENTIAL/PLATELET
Basophils Absolute: 0.1 10*3/uL (ref 0.0–0.1)
Basophils Relative: 1.6 % (ref 0.0–3.0)
Eosinophils Absolute: 0.6 10*3/uL (ref 0.0–0.7)
Eosinophils Relative: 8.1 % — ABNORMAL HIGH (ref 0.0–5.0)
HCT: 42.7 % (ref 36.0–46.0)
Hemoglobin: 14.1 g/dL (ref 12.0–15.0)
Lymphocytes Relative: 44.3 % (ref 12.0–46.0)
Lymphs Abs: 3.1 10*3/uL (ref 0.7–4.0)
MCHC: 33 g/dL (ref 30.0–36.0)
MCV: 92 fl (ref 78.0–100.0)
Monocytes Absolute: 0.5 10*3/uL (ref 0.1–1.0)
Monocytes Relative: 7.1 % (ref 3.0–12.0)
Neutro Abs: 2.7 10*3/uL (ref 1.4–7.7)
Neutrophils Relative %: 38.9 % — ABNORMAL LOW (ref 43.0–77.0)
Platelets: 276 10*3/uL (ref 150.0–400.0)
RBC: 4.64 Mil/uL (ref 3.87–5.11)
RDW: 14.3 % (ref 11.5–15.5)
WBC: 6.9 10*3/uL (ref 4.0–10.5)

## 2021-06-05 LAB — COMPREHENSIVE METABOLIC PANEL
ALT: 14 U/L (ref 0–35)
AST: 16 U/L (ref 0–37)
Albumin: 4.4 g/dL (ref 3.5–5.2)
Alkaline Phosphatase: 52 U/L (ref 39–117)
BUN: 16 mg/dL (ref 6–23)
CO2: 30 mEq/L (ref 19–32)
Calcium: 9.5 mg/dL (ref 8.4–10.5)
Chloride: 105 mEq/L (ref 96–112)
Creatinine, Ser: 0.86 mg/dL (ref 0.40–1.20)
GFR: 69.26 mL/min (ref >60.00)
Glucose, Bld: 99 mg/dL (ref 70–99)
Potassium: 3.7 mEq/L (ref 3.5–5.1)
Sodium: 142 mEq/L (ref 135–145)
Total Bilirubin: 0.5 mg/dL (ref 0.2–1.2)
Total Protein: 7.5 g/dL (ref 6.0–8.3)

## 2021-06-05 LAB — HEMOGLOBIN A1C: Hgb A1c MFr Bld: 6.6 % — ABNORMAL HIGH (ref 4.6–6.5)

## 2021-06-05 LAB — VITAMIN D 25 HYDROXY (VIT D DEFICIENCY, FRACTURES): VITD: 26.26 ng/mL — ABNORMAL LOW (ref 30.00–100.00)

## 2021-06-05 MED ORDER — BUDESONIDE-FORMOTEROL FUMARATE 160-4.5 MCG/ACT IN AERO: 2.0000 | INHALATION_SPRAY | Freq: Two times a day (BID) | RESPIRATORY_TRACT | 3 refills | Status: DC

## 2021-06-05 MED ORDER — ALBUTEROL SULFATE HFA 108 (90 BASE) MCG/ACT IN AERS: 2.0000 | INHALATION_SPRAY | Freq: Four times a day (QID) | RESPIRATORY_TRACT | 0 refills | Status: DC | PRN

## 2021-06-05 NOTE — Patient Instructions (Signed)
Increased dose of your daily maintenance inhaler symbicort ? ?I have also sent in a rescue inhaler to be used as needed, albuterol, for wheezing, cough ? ?Please let me know how you are doing ? ?Asthma, Adult ?Asthma is a long-term (chronic) condition that causes recurrent episodes in which the airways become tight and narrow. The airways are the passages that lead from the nose and mouth down into the lungs. Asthma episodes, also called asthma attacks, can cause coughing, wheezing, shortness of breath, and chest pain. The airways can also fill with mucus. During an attack, it can be difficult to breathe. Asthma attacks can range from minor to life threatening. ?Asthma cannot be cured, but medicines and lifestyle changes can help control it and treat acute attacks. ?What are the causes? ?This condition is believed to be caused by inherited (genetic) and environmental factors, but its exact cause is not known. ?There are many things that can bring on an asthma attack or make asthma symptoms worse (triggers). Asthma triggers are different for each person. Common triggers include: ?Mold. ?Dust. ?Cigarette smoke. ?Cockroaches. ?Things that can cause allergy symptoms (allergens), such as animal dander or pollen from trees or grass. ?Air pollutants such as household cleaners, wood smoke, smog, or Advertising account planner. ?Cold air, weather changes, and winds (which increase molds and pollen in the air). ?Strong emotional expressions such as crying or laughing hard. ?Stress. ?Certain medicines (such as aspirin) or types of medicines (such as beta-blockers). ?Sulfites in foods and drinks. Foods and drinks that may contain sulfites include dried fruit, potato chips, and sparkling grape juice. ?Infections or inflammatory conditions such as the flu, a cold, or inflammation of the nasal membranes (rhinitis). ?Gastroesophageal reflux disease (GERD). ?Exercise or strenuous activity. ?What are the signs or symptoms? ?Symptoms of this  condition may occur right after asthma is triggered or many hours later. Symptoms include: ?Wheezing. This can sound like whistling when you breathe. ?Excessive nighttime or early morning coughing. ?Frequent or severe coughing with a common cold. ?Chest tightness. ?Shortness of breath. ?Tiredness (fatigue) with minimal activity. ?How is this diagnosed? ?This condition is diagnosed based on: ?Your medical history. ?A physical exam. ?Tests, which may include: ?Lung function studies and pulmonary studies (spirometry). These tests can evaluate the flow of air in your lungs. ?Allergy tests. ?Imaging tests, such as X-rays. ?How is this treated? ?There is no cure for this condition, but treatment can help control your symptoms. Treatment for asthma usually involves: ?Identifying and avoiding your asthma triggers. ?Using medicines to control your symptoms. Generally, two types of medicines are used to treat asthma: ?Controller medicines. These help prevent asthma symptoms from occurring. They are usually taken every day. ?Fast-acting reliever or rescue medicines. These quickly relieve asthma symptoms by widening the narrow and tight airways. They are used as needed and provide short-term relief. ?Using supplemental oxygen. This may be needed during a severe episode. ?Using other medicines, such as: ?Allergy medicines, such as antihistamines, if your asthma attacks are triggered by allergens. ?Immune medicines (immunomodulators). These are medicines that help control the immune system. ?Creating an asthma action plan. An asthma action plan is a written plan for managing and treating your asthma attacks. This plan includes: ?A list of your asthma triggers and how to avoid them. ?Information about when medicines should be taken and when their dosage should be changed. ?Instructions about using a device called a peak flow meter. A peak flow meter measures how well the lungs are working and the severity of your  asthma. It helps  you monitor your condition. ?Follow these instructions at home: ?Controlling your home environment ?Control your home environment in the following ways to help avoid triggers and prevent asthma attacks: ?Change your heating and air conditioning filter regularly. ?Limit your use of fireplaces and wood stoves. ?Get rid of pests (such as roaches and mice) and their droppings. ?Throw away plants if you see mold on them. ?Clean floors and dust surfaces regularly. Use unscented cleaning products. ?Try to have someone else vacuum for you regularly. Stay out of rooms while they are being vacuumed and for a short while afterward. If you vacuum, use a dust mask from a hardware store, a double-layered or microfilter vacuum cleaner bag, or a vacuum cleaner with a HEPA filter. ?Replace carpet with wood, tile, or vinyl flooring. Carpet can trap dander and dust. ?Use allergy-proof pillows, mattress covers, and box spring covers. ?Keep your bedroom a trigger-free room. ?Avoid pets and keep windows closed when allergens are in the air. ?Wash beddings every week in hot water and dry them in a dryer. ?Use blankets that are made of polyester or cotton. ?Clean bathrooms and kitchens with bleach. If possible, have someone repaint the walls in these rooms with mold-resistant paint. Stay out of the rooms that are being cleaned and painted. ?Wash your hands often with soap and water. If soap and water are not available, use hand sanitizer. ?Do not allow anyone to smoke in your home. ?General instructions ?Take over-the-counter and prescription medicines only as told by your health care provider. ?Speak with your health care provider if you have questions about how or when to take the medicines. ?Make note if you are requiring more frequent dosages. ?Do not use any products that contain nicotine or tobacco, such as cigarettes and e-cigarettes. If you need help quitting, ask your health care provider. Also, avoid being exposed to secondhand  smoke. ?Use a peak flow meter as told by your health care provider. Record and keep track of the readings. ?Understand and use the asthma action plan to help minimize, or stop an asthma attack, without needing to seek medical care. ?Make sure you stay up to date on your yearly vaccinations as told by your health care provider. This may include vaccines for the flu and pneumonia. ?Avoid outdoor activities when allergen counts are high and when air quality is low. ?Wear a ski mask that covers your nose and mouth during outdoor winter activities. Exercise indoors on cold days if you can. ?Warm up before exercising, and take time for a cool-down period after exercise. ?Keep all follow-up visits as told by your health care provider. This is important. ?Where to find more information ?For information about asthma, turn to the Centers for Disease Control and Prevention at http://www.clark.net/ ?For air quality information, turn to [employer] at https://www.miller-reyes.info/ ?Contact a health care provider if: ?You have wheezing, shortness of breath, or a cough even while you are taking medicine to prevent attacks. ?The mucus you cough up (sputum) is thicker than usual. ?Your sputum changes from clear or white to yellow, green, gray, or bloody. ?Your medicines are causing side effects, such as a rash, itching, swelling, or trouble breathing. ?You need to use a reliever medicine more than 2-3 times a week. ?Your peak flow reading is still at 50-79% of your personal best after following your action plan for 1 hour. ?You have a fever. ?Get help right away if: ?You are getting worse and do not respond to treatment during an  asthma attack. ?You are short of breath when at rest or when doing very little physical activity. ?You have difficulty eating, drinking, or talking. ?You have chest pain or tightness. ?You develop a fast heartbeat or palpitations. ?You have a bluish color to your lips or fingernails. ?You are light-headed or dizzy, or you  faint. ?Your peak flow reading is less than 50% of your personal best. ?You feel too tired to breathe normally. ?Summary ?Asthma is a long-term (chronic) condition that causes recurrent episodes in which the airw

## 2021-06-05 NOTE — Assessment & Plan Note (Signed)
No acute respiratory distress.  Expiratory wheezes on exam.  Suspect allergies and/or virus contributed to cough.  No formal lung  testing however suspect more restrictive airway in nature.  She has never been a smoker.  Increase Symbicort to 160 mg. I also provided her with albuterol rescue inhaler.  Pending chest x-ray.  Counseled her on how to use maintenance versus rescue inhalers.  She will continue Flonase, Zyrtec.  Close follow-up and we will consider adding Singulair and/or formal consult with pulmonology ?

## 2021-06-05 NOTE — Assessment & Plan Note (Signed)
Chronic, stable.  Continue losartan 50 mg, amlodipine 10 mg ?

## 2021-06-05 NOTE — Progress Notes (Signed)
? ?Subjective:  ? ? Patient ID: Diamond Hill, female    DOB: 12/22/1950, 71 y.o.   MRN: [id number] ? ?CC: Diamond Hill is a 71 y.o. female who presents today for follow up.  ? ?HPI: Complains of productive cough x 8 weeks, improved.  She coughs in the morning.  Endorses occasional wheezing, PND.  She is compliant with Zyrtec, Flonase ?Sinus pressure and ear pain resolved after augmentin .  ?No fever, sob, leg swelling.  ?Worse when walking outside. She has seasonal allergies ? ?nonsmoker ? ?Treated for ear infection 04/08/21 ? ? ? ?Hypertension-compliant with losartan 50 mg, amlodipine 10 mg. No cp ?Diabetes-diet controlled ?HISTORY:  ?Past Medical History:  ?Diagnosis Date  ? Blood in stool   ? Cancer [facility]) 2005  ? rectal, Dr. Claybon Jabs, Dr. Mallie Snooks, Dr. Leamon Arnt, s/p XRT  ? Colon polyp   ? Diverticulitis   ? Heart murmur   ? Hypertension   ? ?Past Surgical History:  ?Procedure Laterality Date  ? ABDOMINAL HYSTERECTOMY  1993  ? HAS CERVIX; ovaries intact; hysterectomy for non cancercous reasons, due to fibroid.   ? rectal cancer  2005  ? removal of lesion  ? ?Family History  ?Problem Relation Age of Onset  ? Cancer Mother   ?     colon  ? Hyperlipidemia Mother   ? Hypertension Mother   ? Hyperlipidemia Father   ? Hypertension Father   ? Diabetes Father   ? Heart disease Father   ? Hypertension Other   ? Breast cancer Neg Hx   ? ? ?Allergies: Patient has no known allergies. ?Current Outpatient Medications on File Prior to Visit  ?Medication Sig Dispense Refill  ? amLODipine (NORVASC) 10 MG tablet TAKE 1 TABLET BY MOUTH EVERY DAY 90 tablet 1  ? b complex vitamins capsule Take 1 capsule by mouth daily.    ? cetirizine (ZYRTEC) 10 MG chewable tablet Chew 10 mg by mouth daily.    ? Cholecalciferol (VITAMIN D3) 125 MCG (5000 UT) CAPS Take 1 capsule by mouth daily.    ? fish oil-omega-3 fatty acids 1000 MG capsule Take 2 g by mouth daily.    ? fluticasone (FLONASE) 50 MCG/ACT nasal spray Place 2 sprays into both nostrils  daily. 16 g 6  ? losartan (COZAAR) 50 MG tablet TAKE 1 TABLET BY MOUTH EVERY DAY 90 tablet 3  ? ?No current facility-administered medications on file prior to visit.  ? ? ?Social History  ? ?Tobacco Use  ? Smoking status: Never  ? Smokeless tobacco: Never  ?Substance Use Topics  ? Alcohol use: Yes  ?  Comment: occasional wine  ? Drug use: No  ? ? ?Review of Systems  ?Constitutional:  Negative for chills and fever.  ?HENT:  Positive for postnasal drip. Negative for congestion and ear pain.   ?Respiratory:  Positive for cough and wheezing. Negative for shortness of breath.   ?Cardiovascular:  Negative for chest pain, palpitations and leg swelling.  ?Gastrointestinal:  Negative for nausea and vomiting.  ?   ?Objective:  ?  ?BP 130/78 (BP Location: Left Arm, Patient Position: Sitting, Cuff Size: Large)   Pulse 86   Temp 97.9 ?F (36.6 ?C) (Oral)   Ht '5\' 3"'$  (1.6 m)   Wt 188 lb (85.3 kg)   SpO2 97%   BMI 33.30 kg/m?  ?BP Readings from Last 3 Encounters:  ?06/05/21 130/78  ?04/08/21 139/72  ?03/06/21 120/72  ? ?Wt Readings from Last 3 Encounters:  ?06/05/21  188 lb (85.3 kg)  ?04/21/21 185 lb (83.9 kg)  ?04/08/21 185 lb 9.6 oz (84.2 kg)  ? ? ?Physical Exam ?Vitals reviewed.  ?Constitutional:   ?   Appearance: She is well-developed.  ?Eyes:  ?   Conjunctiva/sclera: Conjunctivae normal.  ?Cardiovascular:  ?   Rate and Rhythm: Normal rate and regular rhythm.  ?   Pulses: Normal pulses.  ?   Heart sounds: Normal heart sounds.  ?Pulmonary:  ?   Effort: Pulmonary effort is normal.  ?   Breath sounds: Normal breath sounds. No wheezing, rhonchi or rales.  ?   Comments: Few expiratory wheezes on exam ?Skin: ?   General: Skin is warm and dry.  ?Neurological:  ?   Mental Status: She is alert.  ?Psychiatric:     ?   Speech: Speech normal.     ?   Behavior: Behavior normal.     ?   Thought Content: Thought content normal.  ? ? ?   ?Assessment & Plan:  ? ?Problem List Items Addressed This Visit   ? ?  ? Cardiovascular and  Mediastinum  ? Hypertension  ?  Chronic, stable.  Continue losartan 50 mg, amlodipine 10 mg ?  ?  ?  ? Respiratory  ? Mild intermittent asthma with exacerbation  ?  No acute respiratory distress.  Expiratory wheezes on exam.  Suspect allergies and/or virus contributed to cough.  No formal lung  testing however suspect more restrictive airway in nature.  She has never been a smoker.  Increase Symbicort to 160 mg. I also provided her with albuterol rescue inhaler.  Pending chest x-ray.  Counseled her on how to use maintenance versus rescue inhalers.  She will continue Flonase, Zyrtec.  Close follow-up and we will consider adding Singulair and/or formal consult with pulmonology ?  ?  ? Relevant Medications  ? budesonide-formoterol (SYMBICORT) 160-4.5 MCG/ACT inhaler  ? albuterol (VENTOLIN HFA) 108 (90 Base) MCG/ACT inhaler  ? Other Relevant Orders  ? DG Chest 2 View  ?  ? Endocrine  ? 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 Relevant Orders  ? Comprehensive metabolic panel  ? Hemoglobin A1c  ? CBC with Differential/Platelet  ?  ? Other  ? Wheezing  ? ?Other Visit Diagnoses   ? ? Vitamin D deficiency      ? Relevant Orders  ? VITAMIN D 25 Hydroxy (Vit-D Deficiency, Fractures)  ? ?  ? ? ? ?I have discontinued Diamond Hill's budesonide-formoterol. I am also having her start on budesonide-formoterol and albuterol. Additionally, I am having her maintain her Vitamin D3, fish oil-omega-3 fatty acids, fluticasone, b complex vitamins, amLODipine, losartan, and cetirizine. ? ? ?Meds ordered this encounter  ?Medications  ? budesonide-formoterol (SYMBICORT) 160-4.5 MCG/ACT inhaler  ?  Sig: Inhale 2 puffs into the lungs 2 (two) times daily.  ?  Dispense:  1 each  ?  Refill:  3  ?  Order Specific Question:   Supervising Provider  ?  Answer:   Crecencio Mc [[id number]]  ? albuterol (VENTOLIN HFA) 108 (90 Base) MCG/ACT inhaler  ?  Sig: Inhale 2 puffs into the lungs every 6 (six) hours as needed for wheezing or  shortness of breath.  ?  Dispense:  8 g  ?  Refill:  0  ?  Order Specific Question:   Supervising Provider  ?  Answer:   Crecencio Mc [[id number]]  ? ? ?Return precautions given.  ? ?Risks, benefits,  and alternatives of the medications and treatment plan prescribed today were discussed, and patient expressed understanding.  ? ?Education regarding symptom management and diagnosis given to patient on AVS. ? ?Continue to follow with Burnard Hawthorne, FNP for routine health maintenance.  ? ?Diamond Hill and I agreed with plan.  ? ?[name], FNP ? ? ?

## 2021-06-07 ENCOUNTER — Other Ambulatory Visit: Payer: Self-pay | Admitting: Family

## 2021-06-07 DIAGNOSIS — R899 Unspecified abnormal finding in specimens from other organs, systems and tissues: Secondary | ICD-10-CM

## 2021-09-04 ENCOUNTER — Encounter: Payer: Self-pay | Admitting: Family

## 2021-09-04 ENCOUNTER — Ambulatory Visit (INDEPENDENT_AMBULATORY_CARE_PROVIDER_SITE_OTHER): Payer: Medicare HMO | Admitting: Family

## 2021-09-04 VITALS — BP 126/70 | HR 78 | Temp 98.5°F | Ht 63.0 in | Wt 186.0 lb

## 2021-09-04 DIAGNOSIS — Z1231 Encounter for screening mammogram for malignant neoplasm of breast: Secondary | ICD-10-CM

## 2021-09-04 DIAGNOSIS — I10 Essential (primary) hypertension: Secondary | ICD-10-CM | POA: Diagnosis not present

## 2021-09-04 DIAGNOSIS — J4521 Mild intermittent asthma with (acute) exacerbation: Secondary | ICD-10-CM

## 2021-09-04 DIAGNOSIS — E118 Type 2 diabetes mellitus with unspecified complications: Secondary | ICD-10-CM

## 2021-09-04 NOTE — Assessment & Plan Note (Signed)
Chronic, stable.  Continue losartan 50 mg

## 2021-09-04 NOTE — Patient Instructions (Signed)
Please let me know if vaginal bleeding persists   please call  and schedule your 3D mammogram and /or bone density scan as we discussed.   [facility]  ( new location in 2023)  [address], [place], [place] [address]  [place], [place]  [phone]   I have ordered transvaginal ultrasound.  Let us know if you dont hear back within a week in regards to an appointment being scheduled.   So that you are aware, if you are [facility] MyChart user , please pay attention to your MyChart messages as you may receive a MyChart message with a phone number to call and schedule this test/appointment own your own from our referral coordinator. This is a new process so I do not want you to miss this message.  If you are not a MyChart user, you will receive a phone call.   

## 2021-09-04 NOTE — Progress Notes (Signed)
Subjective:    Patient ID: Diamond Hill, female    DOB: [birth year], 71 y.o.   MRN: [id number]  CC: Diamond Hill is a 71 y.o. female who presents today for follow up.   HPI: Feels well today No new complaints   Asthma-compliant with Symbicort twice daily which is very helpful for her.  Rare use of albuterol.  No chest pain shortness of breath or wheezing. HTN- compliant with losartan '50mg'$   She is following DM diet Colonoscopy UTD , Dr Teresa Pelton, polyps seen. Repeat 3 years HISTORY:  Past Medical History:  Diagnosis Date   Blood in stool    Cancer [facility]) 2005   rectal, Dr. Claybon Jabs, Dr. Mallie Snooks, Dr. Leamon Arnt, s/p XRT   Colon polyp    Diverticulitis    Heart murmur    Hypertension    Past Surgical History:  Procedure Laterality Date   ABDOMINAL HYSTERECTOMY  1993   HAS CERVIX; ovaries intact; hysterectomy for non cancercous reasons, due to fibroid.    rectal cancer  2005   removal of lesion   Family History  Problem Relation Age of Onset   Cancer Mother        colon   Hyperlipidemia Mother    Hypertension Mother    Hyperlipidemia Father    Hypertension Father    Diabetes Father    Heart disease Father    Hypertension Other    Breast cancer Neg Hx     Allergies: Patient has no known allergies. Current Outpatient Medications on File Prior to Visit  Medication Sig Dispense Refill   albuterol (VENTOLIN HFA) 108 (90 Base) MCG/ACT inhaler Inhale 2 puffs into the lungs every 6 (six) hours as needed for wheezing or shortness of breath. 8 g 0   amLODipine (NORVASC) 10 MG tablet TAKE 1 TABLET BY MOUTH EVERY DAY 90 tablet 1   b complex vitamins capsule Take 1 capsule by mouth daily.     budesonide-formoterol (SYMBICORT) 160-4.5 MCG/ACT inhaler Inhale 2 puffs into the lungs 2 (two) times daily. 1 each 3   cetirizine (ZYRTEC) 10 MG chewable tablet Chew 10 mg by mouth daily.     Cholecalciferol (VITAMIN D3) 125 MCG (5000 UT) CAPS Take 1 capsule by mouth daily.     fish  oil-omega-3 fatty acids 1000 MG capsule Take 2 g by mouth daily.     fluticasone (FLONASE) 50 MCG/ACT nasal spray Place 2 sprays into both nostrils daily. 16 g 6   losartan (COZAAR) 50 MG tablet TAKE 1 TABLET BY MOUTH EVERY DAY 90 tablet 3   No current facility-administered medications on file prior to visit.    Social History   Tobacco Use   Smoking status: Never   Smokeless tobacco: Never  Substance Use Topics   Alcohol use: Yes    Comment: occasional wine   Drug use: No    Review of Systems  Constitutional:  Negative for chills and fever.  Respiratory:  Negative for cough.   Cardiovascular:  Negative for chest pain and palpitations.  Gastrointestinal:  Negative for nausea and vomiting.      Objective:    BP 126/70 (BP Location: Left Arm, Patient Position: Sitting, Cuff Size: Normal)   Pulse 78   Temp 98.5 F (36.9 C) (Oral)   Ht '5\' 3"'$  (1.6 m)   Wt 186 lb (84.4 kg)   SpO2 95%   BMI 32.95 kg/m  BP Readings from Last 3 Encounters:  09/04/21 126/70  06/05/21 130/78  04/08/21  139/72   Wt Readings from Last 3 Encounters:  09/04/21 186 lb (84.4 kg)  06/05/21 188 lb (85.3 kg)  04/21/21 185 lb (83.9 kg)    Physical Exam Vitals reviewed.  Constitutional:      Appearance: She is well-developed.  Eyes:     Conjunctiva/sclera: Conjunctivae normal.  Cardiovascular:     Rate and Rhythm: Normal rate and regular rhythm.     Pulses: Normal pulses.     Heart sounds: Normal heart sounds.  Pulmonary:     Effort: Pulmonary effort is normal.     Breath sounds: Normal breath sounds. No wheezing, rhonchi or rales.  Skin:    General: Skin is warm and dry.  Neurological:     Mental Status: She is alert.  Psychiatric:        Speech: Speech normal.        Behavior: Behavior normal.        Thought Content: Thought content normal.        Assessment & Plan:   Problem List Items Addressed This Visit       Cardiovascular and Mediastinum   Hypertension    Chronic,  stable.  Continue losartan 50 mg        Respiratory   Mild intermittent asthma with exacerbation    Chronic, well controlled at this time.  Continue Symbicort 160-4.5, rare use albuterol.         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  Pending A1c      Relevant Orders   Hemoglobin A1c   Lipid panel   Microalbumin / creatinine urine ratio   Other Visit Diagnoses     Encounter for screening mammogram for malignant neoplasm of breast       Relevant Orders   MM 3D SCREEN BREAST BILATERAL        I am having Diamond Hill maintain her Vitamin D3, fish oil-omega-3 fatty acids, fluticasone, b complex vitamins, amLODipine, losartan, cetirizine, budesonide-formoterol, and albuterol.   No orders of the defined types were placed in this encounter.   Return precautions given.   Risks, benefits, and alternatives of the medications and treatment plan prescribed today were discussed, and patient expressed understanding.   Education regarding symptom management and diagnosis given to patient on AVS.  Continue to follow with Burnard Hawthorne, FNP for routine health maintenance.   Diamond Hill and I agreed with plan.   [name], FNP

## 2021-09-04 NOTE — Assessment & Plan Note (Signed)
Chronic, well controlled at this time.  Continue Symbicort 160-4.5, rare use albuterol.

## 2021-09-04 NOTE — Assessment & Plan Note (Signed)
Diet managed.  Pending A1c

## 2021-10-09 ENCOUNTER — Other Ambulatory Visit (INDEPENDENT_AMBULATORY_CARE_PROVIDER_SITE_OTHER): Payer: Medicare HMO

## 2021-10-09 DIAGNOSIS — E118 Type 2 diabetes mellitus with unspecified complications: Secondary | ICD-10-CM | POA: Diagnosis not present

## 2021-10-09 DIAGNOSIS — R899 Unspecified abnormal finding in specimens from other organs, systems and tissues: Secondary | ICD-10-CM | POA: Diagnosis not present

## 2021-10-09 LAB — CBC WITH DIFFERENTIAL/PLATELET
Basophils Absolute: 0.1 10*3/uL (ref 0.0–0.1)
Basophils Relative: 1.2 % (ref 0.0–3.0)
Eosinophils Absolute: 0.4 10*3/uL (ref 0.0–0.7)
Eosinophils Relative: 6.7 % — ABNORMAL HIGH (ref 0.0–5.0)
HCT: 42.7 % (ref 36.0–46.0)
Hemoglobin: 14.2 g/dL (ref 12.0–15.0)
Lymphocytes Relative: 43.5 % (ref 12.0–46.0)
Lymphs Abs: 2.5 10*3/uL (ref 0.7–4.0)
MCHC: 33.3 g/dL (ref 30.0–36.0)
MCV: 91.3 fl (ref 78.0–100.0)
Monocytes Absolute: 0.5 10*3/uL (ref 0.1–1.0)
Monocytes Relative: 8.2 % (ref 3.0–12.0)
Neutro Abs: 2.3 10*3/uL (ref 1.4–7.7)
Neutrophils Relative %: 40.4 % — ABNORMAL LOW (ref 43.0–77.0)
Platelets: 255 10*3/uL (ref 150.0–400.0)
RBC: 4.68 Mil/uL (ref 3.87–5.11)
RDW: 14.4 % (ref 11.5–15.5)
WBC: 5.8 10*3/uL (ref 4.0–10.5)

## 2021-10-09 LAB — LIPID PANEL
Cholesterol: 192 mg/dL (ref 0–200)
HDL: 94.1 mg/dL (ref >39.00)
LDL Cholesterol: 87 mg/dL (ref 0–99)
NonHDL: 97.61
Total CHOL/HDL Ratio: 2
Triglycerides: 53 mg/dL (ref 0.0–149.0)
VLDL: 10.6 mg/dL (ref 0.0–40.0)

## 2021-10-09 LAB — MICROALBUMIN / CREATININE URINE RATIO
Creatinine,U: 65.6 mg/dL
Microalb Creat Ratio: 2.1 mg/g (ref 0.0–30.0)
Microalb, Ur: 1.4 mg/dL (ref 0.0–1.9)

## 2021-10-09 LAB — HEMOGLOBIN A1C: Hgb A1c MFr Bld: 6.7 % — ABNORMAL HIGH (ref 4.6–6.5)

## 2021-10-13 ENCOUNTER — Other Ambulatory Visit: Payer: Self-pay | Admitting: Family

## 2021-10-13 DIAGNOSIS — R899 Unspecified abnormal finding in specimens from other organs, systems and tissues: Secondary | ICD-10-CM

## 2021-11-11 ENCOUNTER — Ambulatory Visit
Admission: RE | Admit: 2021-11-11 | Discharge: 2021-11-11 | Disposition: A | Discharge status: Home / Self Care | Payer: Medicare HMO | Source: Ambulatory Visit | Attending: Family | Admitting: Family

## 2021-11-11 DIAGNOSIS — Z1231 Encounter for screening mammogram for malignant neoplasm of breast: Secondary | ICD-10-CM | POA: Insufficient documentation

## 2021-11-13 ENCOUNTER — Other Ambulatory Visit: Payer: Self-pay | Admitting: Family

## 2021-11-13 DIAGNOSIS — I10 Essential (primary) hypertension: Secondary | ICD-10-CM

## 2022-02-18 ENCOUNTER — Other Ambulatory Visit: Payer: Self-pay | Admitting: Family

## 2022-04-07 DIAGNOSIS — D808 Other immunodeficiencies with predominantly antibody defects: Secondary | ICD-10-CM | POA: Diagnosis not present

## 2022-04-23 ENCOUNTER — Ambulatory Visit (INDEPENDENT_AMBULATORY_CARE_PROVIDER_SITE_OTHER): Payer: Medicare HMO

## 2022-04-23 VITALS — Ht 63.0 in | Wt 186.0 lb

## 2022-04-23 DIAGNOSIS — Z0000 Encounter for general adult medical examination without abnormal findings: Secondary | ICD-10-CM

## 2022-04-23 NOTE — Patient Instructions (Addendum)
Diamond Hill , Thank you for taking time to come for your Medicare Wellness Visit. I appreciate your ongoing commitment to your health goals. Please review the following plan we discussed and let me know if I can assist you in the future.   These are the goals we discussed:  Goals       Patient Stated     DIET - REDUCE SUGAR INTAKE (pt-stated)      Portion control Maintain weight  Stay active      Increase physical activity (pt-stated)      Schedule eye exam        This is a list of the screening recommended for you and due dates:  Health Maintenance  Topic Date Due   Eye exam for diabetics  01/26/2022   Hemoglobin A1C  04/11/2022   COVID-19 Vaccine (5 - 2023-24 season) 05/09/2022*   Flu Shot  06/06/2022*   Yearly kidney function blood test for diabetes  06/06/2022   Complete foot exam   06/06/2022   Yearly kidney health urinalysis for diabetes  10/10/2022   DTaP/Tdap/Td vaccine (2 - Td or Tdap) 10/13/2022   Medicare Annual Wellness Visit  04/24/2023   Colon Cancer Screening  08/02/2023   Mammogram  11/12/2023   DEXA scan (bone density measurement)  Completed   Hepatitis C Screening: USPSTF Recommendation to screen - Ages 62-79 yo.  Completed   HPV Vaccine  Aged Out   Pneumonia Vaccine  Discontinued   Zoster (Shingles) Vaccine  Discontinued  *Topic was postponed. The date shown is not the original due date.    Advanced directives: not yet completed  Conditions/risks identified: none new  Next appointment: Follow up in one year for your annual wellness visit    Preventive Care 65 Years and Older, Female Preventive care refers to lifestyle choices and visits with your health care provider that can promote health and wellness. What does preventive care include? A yearly physical exam. This is also called an annual well check. Dental exams once or twice a year. Routine eye exams. Ask your health care provider how often you should have your eyes checked. Personal  lifestyle choices, including: Daily care of your teeth and gums. Regular physical activity. Eating a healthy diet. Avoiding tobacco and drug use. Limiting alcohol use. Practicing safe sex. Taking low-dose aspirin every day. Taking vitamin and mineral supplements as recommended by your health care provider. What happens during an annual well check? The services and screenings done by your health care provider during your annual well check will depend on your age, overall health, lifestyle risk factors, and family history of disease. Counseling  Your health care provider may ask you questions about your: Alcohol use. Tobacco use. Drug use. Emotional well-being. Home and relationship well-being. Sexual activity. Eating habits. History of falls. Memory and ability to understand (cognition). Work and work Statistician. Reproductive health. Screening  You may have the following tests or measurements: Height, weight, and BMI. Blood pressure. Lipid and cholesterol levels. These may be checked every 5 years, or more frequently if you are over 63 years old. Skin check. Lung cancer screening. You may have this screening every year starting at age 15 if you have a 30-pack-year history of smoking and currently smoke or have quit within the past 15 years. Fecal occult blood test (FOBT) of the stool. You may have this test every year starting at age 59. Flexible sigmoidoscopy or colonoscopy. You may have a sigmoidoscopy every 5 years or a colonoscopy  every 10 years starting at age 13. Hepatitis C blood test. Hepatitis B blood test. Sexually transmitted disease (STD) testing. Diabetes screening. This is done by checking your blood sugar (glucose) after you have not eaten for a while (fasting). You may have this done every 1-3 years. Bone density scan. This is done to screen for osteoporosis. You may have this done starting at age 51. Mammogram. This may be done every 1-2 years. Talk to your  health care provider about how often you should have regular mammograms. Talk with your health care provider about your test results, treatment options, and if necessary, the need for more tests. Vaccines  Your health care provider may recommend certain vaccines, such as: Influenza vaccine. This is recommended every year. Tetanus, diphtheria, and acellular pertussis (Tdap, Td) vaccine. You may need a Td booster every 10 years. Zoster vaccine. You may need this after age 33. Pneumococcal 13-valent conjugate (PCV13) vaccine. One dose is recommended after age 21. Pneumococcal polysaccharide (PPSV23) vaccine. One dose is recommended after age 39. Talk to your health care provider about which screenings and vaccines you need and how often you need them. This information is not intended to replace advice given to you by your health care provider. Make sure you discuss any questions you have with your health care provider. Document Released: 03/21/2015 Document Revised: 11/12/2015 Document Reviewed: 12/24/2014 Elsevier Interactive Patient Education  2017 [place] Prevention in the Home Falls can cause injuries. They can happen to people of all ages. There are many things you can do to make your home safe and to help prevent falls. What can I do on the outside of my home? Regularly fix the edges of walkways and driveways and fix any cracks. Remove anything that might make you trip as you walk through a door, such as a raised step or threshold. Trim any bushes or trees on the path to your home. Use bright outdoor lighting. Clear any walking paths of anything that might make someone trip, such as rocks or tools. Regularly check to see if handrails are loose or broken. Make sure that both sides of any steps have handrails. Any raised decks and porches should have guardrails on the edges. Have any leaves, snow, or ice cleared regularly. Use sand or salt on walking paths during winter. Clean  up any spills in your garage right away. This includes oil or grease spills. What can I do in the bathroom? Use night lights. Install grab bars by the toilet and in the tub and shower. Do not use towel bars as grab bars. Use non-skid mats or decals in the tub or shower. If you need to sit down in the shower, use a plastic, non-slip stool. Keep the floor dry. Clean up any water that spills on the floor as soon as it happens. Remove soap buildup in the tub or shower regularly. Attach bath mats securely with double-sided non-slip rug tape. Do not have throw rugs and other things on the floor that can make you trip. What can I do in the bedroom? Use night lights. Make sure that you have a light by your bed that is easy to reach. Do not use any sheets or blankets that are too big for your bed. They should not hang down onto the floor. Have a firm chair that has side arms. You can use this for support while you get dressed. Do not have throw rugs and other things on the floor that can make  you trip. What can I do in the kitchen? Clean up any spills right away. Avoid walking on wet floors. Keep items that you use a lot in easy-to-reach places. If you need to reach something above you, use a strong step stool that has a grab bar. Keep electrical cords out of the way. Do not use floor polish or wax that makes floors slippery. If you must use wax, use non-skid floor wax. Do not have throw rugs and other things on the floor that can make you trip. What can I do with my stairs? Do not leave any items on the stairs. Make sure that there are handrails on both sides of the stairs and use them. Fix handrails that are broken or loose. Make sure that handrails are as long as the stairways. Check any carpeting to make sure that it is firmly attached to the stairs. Fix any carpet that is loose or worn. Avoid having throw rugs at the top or bottom of the stairs. If you do have throw rugs, attach them to the  floor with carpet tape. Make sure that you have a light switch at the top of the stairs and the bottom of the stairs. If you do not have them, ask someone to add them for you. What else can I do to help prevent falls? Wear shoes that: Do not have high heels. Have rubber bottoms. Are comfortable and fit you well. Are closed at the toe. Do not wear sandals. If you use a stepladder: Make sure that it is fully opened. Do not climb a closed stepladder. Make sure that both sides of the stepladder are locked into place. Ask someone to hold it for you, if possible. Clearly mark and make sure that you can see: Any grab bars or handrails. First and last steps. Where the edge of each step is. Use tools that help you move around (mobility aids) if they are needed. These include: Canes. Walkers. Scooters. Crutches. Turn on the lights when you go into a dark area. Replace any light bulbs as soon as they burn out. Set up your furniture so you have a clear path. Avoid moving your furniture around. If any of your floors are uneven, fix them. If there are any pets around you, be aware of where they are. Review your medicines with your doctor. Some medicines can make you feel dizzy. This can increase your chance of falling. Ask your doctor what other things that you can do to help prevent falls. This information is not intended to replace advice given to you by your health care provider. Make sure you discuss any questions you have with your health care provider. Document Released: 12/19/2008 Document Revised: 07/31/2015 Document Reviewed: 03/29/2014 Elsevier Interactive Patient Education  2017 [employer].

## 2022-04-23 NOTE — Progress Notes (Signed)
Subjective:   Diamond Hill is a 72 y.o. female who presents for Medicare Annual (Subsequent) preventive examination.  Review of Systems    No ROS.  Medicare Wellness Virtual Visit.  Visual/audio telehealth visit, UTA vital signs.   See social history for additional risk factors.   Cardiac Risk Factors include: advanced age (>60mn, >>40women);hypertension;diabetes mellitus     Objective:    Today's Vitals   04/23/22 1446  Weight: 186 lb (84.4 kg)  Height: 5' 3"$  (1.6 m)   Body mass index is 32.95 kg/m.     04/23/2022    2:35 PM 04/18/2020    2:16 PM  Advanced Directives  Does Patient Have a Medical Advance Directive? No No  Would patient like information on creating a medical advance directive? No - Patient declined No - Patient declined    Current Medications (verified) Outpatient Encounter Medications as of 04/23/2022  Medication Sig   albuterol (VENTOLIN HFA) 108 (90 Base) MCG/ACT inhaler Inhale 2 puffs into the lungs every 6 (six) hours as needed for wheezing or shortness of breath.   amLODipine (NORVASC) 10 MG tablet TAKE 1 TABLET BY MOUTH EVERY DAY   b complex vitamins capsule Take 1 capsule by mouth daily.   budesonide-formoterol (SYMBICORT) 160-4.5 MCG/ACT inhaler Inhale 2 puffs into the lungs 2 (two) times daily.   budesonide-formoterol (SYMBICORT) 80-4.5 MCG/ACT inhaler INHALE 2 PUFFS INTO THE LUNGS IN THE MORNING AND 2 PUFFS AT BEDTIME.   cetirizine (ZYRTEC) 10 MG chewable tablet Chew 10 mg by mouth daily.   Cholecalciferol (VITAMIN D3) 125 MCG (5000 UT) CAPS Take 1 capsule by mouth daily.   fish oil-omega-3 fatty acids 1000 MG capsule Take 2 g by mouth daily.   fluticasone (FLONASE) 50 MCG/ACT nasal spray Place 2 sprays into both nostrils daily.   losartan (COZAAR) 50 MG tablet TAKE 1 TABLET BY MOUTH EVERY DAY   No facility-administered encounter medications on file as of 04/23/2022.    Allergies (verified) Patient has no known allergies.    History: Past Medical History:  Diagnosis Date   Blood in stool    Cancer ([facility] 2005   rectal, Dr. ZClaybon Jabs Dr. LMallie Snooks Dr. MLeamon Arnt s/p XRT   Colon polyp    Diverticulitis    Heart murmur    Hypertension    Past Surgical History:  Procedure Laterality Date   ABDOMINAL HYSTERECTOMY  1993   HAS CERVIX; ovaries intact; hysterectomy for non cancercous reasons, due to fibroid.    rectal cancer  2005   removal of lesion   Family History  Problem Relation Age of Onset   Cancer Mother        colon   Hyperlipidemia Mother    Hypertension Mother    Hyperlipidemia Father    Hypertension Father    Diabetes Father    Heart disease Father    Breast cancer Sister        stage 0   Hypertension Other    Social History   Socioeconomic History   Marital status: Divorced    Spouse name: Not on file   Number of children: Not on file   Years of education: Not on file   Highest education level: Not on file  Occupational History   Not on file  Tobacco Use   Smoking status: Never   Smokeless tobacco: Never  Substance and Sexual Activity   Alcohol use: Yes    Comment: occasional wine   Drug use: No   Sexual activity:  Not on file  Other Topics Concern   Not on file  Social History Narrative   Lives in [place]. Separate from husband. 2 children.      Work - retired   Diet - Healthy   Exercise - none regular   Social Determinants of Radio broadcast assistant Strain: Low Risk  (04/23/2022)   Overall Financial Resource Strain (CARDIA)    Difficulty of Paying Living Expenses: Not hard at all  Food Insecurity: No Food Insecurity (04/23/2022)   Hunger Vital Sign    Worried About Running Out of Food in the Last Year: Never true    [place] in the Last Year: Never true  Transportation Needs: No Transportation Needs (04/23/2022)   PRAPARE - Hydrologist (Medical): No    Lack of Transportation (Non-Medical): No  Physical Activity: Unknown  (04/18/2020)   Exercise Vital Sign    Days of Exercise per Week: 0 days    Minutes of Exercise per Session: Not on file  Stress: No Stress Concern Present (04/23/2022)   [place]    Feeling of Stress : Not at all  Social Connections: Unknown (04/23/2022)   Social Connection and Isolation Panel [NHANES]    Frequency of Communication with Friends and Family: More than three times a week    Frequency of Social Gatherings with Friends and Family: More than three times a week    Attends Religious Services: More than 4 times per year    Active Member of [employer] or Organizations: Yes    Attends Archivist Meetings: Not on file    Marital Status: Not on file    Tobacco Counseling Counseling given: Not Answered   Clinical Intake:  Pre-visit preparation completed: Yes        Diabetes:  (Followed by pcp)  How often do you need to have someone help you when you read instructions, pamphlets, or other written materials from your doctor or pharmacy?: 1 - Never    Interpreter Needed?: No      Activities of Daily Living    04/23/2022    2:41 PM  In your present state of health, do you have any difficulty performing the following activities:  Hearing? 0  Vision? 0  Difficulty concentrating or making decisions? 0  Walking or climbing stairs? 0  Dressing or bathing? 0  Doing errands, shopping? 0  Preparing Food and eating ? N  Using the Toilet? N  In the past six months, have you accidently leaked urine? N  Do you have problems with loss of bowel control? N  Managing your Medications? N  Managing your Finances? N  Housekeeping or managing your Housekeeping? N    Patient Care Team: Burnard Hawthorne, FNP as PCP - General (Family Medicine)  Indicate any recent Medical Services you may have received from other than [facility] providers in the past year (date may be approximate).     Assessment:   This is a  routine wellness examination for [facility].  I connected with  [name] on 04/23/22 by a audio enabled telemedicine application and verified that I am speaking with the correct person using two identifiers.  Patient Location: Home  Provider Location: Office/Clinic  I discussed the limitations of evaluation and management by telemedicine. The patient expressed understanding and agreed to proceed.   Hearing/Vision screen Hearing Screening - Comments:: Patient is able to hear conversational tones  without difficulty.  No issues reported.   Vision Screening - Comments:: Followed by Dr. Ellin Mayhew Wears corrective lenses     Dietary issues and exercise activities discussed:     Goals Addressed               This Visit's Progress     Patient Stated     Increase physical activity (pt-stated)        Schedule eye exam       Depression Screen    04/23/2022    2:37 PM 09/04/2021    4:01 PM 06/04/2021    3:13 PM 04/21/2021    2:19 PM 04/08/2021    8:07 AM 10/21/2020    8:16 AM 07/18/2020   12:25 PM  PHQ 2/9 Scores  PHQ - 2 Score 0 0 0 0 0 0 2  PHQ- 9 Score   0    6    Fall Risk    04/23/2022    2:36 PM 09/04/2021    4:01 PM 06/05/2021    8:03 AM 04/21/2021    2:24 PM 04/08/2021    8:07 AM  [place] in the past year? 0 0 0 0 0  Number falls in past yr: 0 0 0 0 0  Injury with Fall? 0 0 0  0  Risk for fall due to :  No Fall Risks No Fall Risks  No Fall Risks  Follow up Falls evaluation completed;Falls prevention discussed Falls evaluation completed Falls evaluation completed Falls evaluation completed Falls evaluation completed    FALL RISK PREVENTION PERTAINING TO THE HOME: Home free of loose throw rugs in walkways, pet beds, electrical cords, etc? Yes  Adequate lighting in your home to reduce risk of falls? Yes   ASSISTIVE DEVICES UTILIZED TO PREVENT FALLS: Life alert? No  Use of a cane, walker or w/c? No   TIMED UP AND GO: Was the test performed? No .    Cognitive Function:        04/23/2022    3:04 PM  6CIT Screen  What Year? 0 points  What month? 0 points  What time? 0 points  Count back from 20 0 points  Months in reverse 0 points  Repeat phrase 0 points  Total Score 0 points    Immunizations Immunization History  Administered Date(s) Administered   Influenza Split 12/06/2013   Influenza-Unspecified 12/25/2012   Moderna Sars-Covid-2 Vaccination 05/05/2019, 06/02/2019, 02/28/2020, 10/10/2020   Tdap 10/12/2012   Flu Vaccine status: Declined, Education has been provided regarding the importance of this vaccine but patient still declined. Advised may receive this vaccine at local pharmacy or Health Dept. Aware to provide a copy of the vaccination record if obtained from local pharmacy or Health Dept. Verbalized acceptance and understanding.  Screening Tests Health Maintenance  Topic Date Due   OPHTHALMOLOGY EXAM  01/26/2022   HEMOGLOBIN A1C  04/11/2022   COVID-19 Vaccine (5 - 2023-24 season) 05/09/2022 (Originally 11/06/2021)   INFLUENZA VACCINE  06/06/2022 (Originally 10/06/2021)   Diabetic kidney evaluation - eGFR measurement  06/06/2022   FOOT EXAM  06/06/2022   Diabetic kidney evaluation - Urine ACR  10/10/2022   DTaP/Tdap/Td (2 - Td or Tdap) 10/13/2022   Medicare Annual Wellness (AWV)  04/24/2023   COLONOSCOPY (Pts 45-62yr Insurance coverage will need to be confirmed)  08/02/2023   MAMMOGRAM  11/12/2023   DEXA SCAN  Completed   Hepatitis C Screening  Completed   HPV VACCINES  Aged Out  Pneumonia Vaccine 82+ Years old  Discontinued   Zoster Vaccines- Shingrix  Discontinued    Health Maintenance Health Maintenance Due  Topic Date Due   OPHTHALMOLOGY EXAM  01/26/2022   HEMOGLOBIN A1C  04/11/2022   Lung Cancer Screening: (Low Dose CT Chest recommended if Age 45-80 years, 30 pack-year currently smoking OR have quit w/in 15years.) does not qualify.   Hepatitis C Screening: Completed 2017.  Vision Screening:  Recommended annual ophthalmology exams for early detection of glaucoma and other disorders of the eye.  Dental Screening: Recommended annual dental exams for proper oral hygiene  Community Resource Referral / Chronic Care Management: CRR required this visit?  No   CCM required this visit?  No      Plan:     I have personally reviewed and noted the following in the patient's chart:   Medical and social history Use of alcohol, tobacco or illicit drugs  Current medications and supplements including opioid prescriptions. Patient is not currently taking opioid prescriptions. Functional ability and status Nutritional status Physical activity Advanced directives List of other physicians Hospitalizations, surgeries, and ER visits in previous 12 months Vitals Screenings to include cognitive, depression, and falls Referrals and appointments  In addition, I have reviewed and discussed with patient certain preventive protocols, quality metrics, and best practice recommendations. A written personalized care plan for preventive services as well as general preventive health recommendations were provided to patient.     Leta Jungling, LPN   [id number]

## 2022-05-16 ENCOUNTER — Other Ambulatory Visit: Payer: Self-pay | Admitting: Family

## 2022-05-16 DIAGNOSIS — I10 Essential (primary) hypertension: Secondary | ICD-10-CM

## 2022-05-20 ENCOUNTER — Other Ambulatory Visit: Payer: Self-pay | Admitting: Family

## 2022-05-20 DIAGNOSIS — J4521 Mild intermittent asthma with (acute) exacerbation: Secondary | ICD-10-CM

## 2022-05-28 ENCOUNTER — Telehealth: Payer: Self-pay | Admitting: Family

## 2022-05-28 DIAGNOSIS — I10 Essential (primary) hypertension: Secondary | ICD-10-CM

## 2022-05-28 MED ORDER — AMLODIPINE BESYLATE 10 MG PO TABS: 10.0000 mg | ORAL_TABLET | Freq: Every day | ORAL | 1 refills | Status: DC

## 2022-05-28 NOTE — Telephone Encounter (Signed)
RX SENT.the patient AWARE

## 2022-05-28 NOTE — Telephone Encounter (Signed)
Prescription Request  05/28/2022  LOV: 09/04/2021  What is the name of the medication or equipment? amLODipine (NORVASC) 10 MG tablet  Have you contacted your pharmacy to request a refill? Yes   Which pharmacy would you like this sent to?   [id number] - [place], [place] - [address] [place] [address] Phone: [phone] Fax: [phone]    Patient notified that their request is being sent to the clinical staff for review and that they should receive a response within 2 business days.   Please advise at Mobile [phone] (mobile)

## 2022-06-16 ENCOUNTER — Other Ambulatory Visit: Payer: Self-pay | Admitting: Family

## 2022-06-16 DIAGNOSIS — I10 Essential (primary) hypertension: Secondary | ICD-10-CM

## 2022-07-05 ENCOUNTER — Ambulatory Visit: Payer: Medicare HMO | Admitting: Family

## 2022-07-12 ENCOUNTER — Encounter: Payer: Self-pay | Admitting: Family

## 2022-07-12 ENCOUNTER — Ambulatory Visit (INDEPENDENT_AMBULATORY_CARE_PROVIDER_SITE_OTHER): Payer: Medicare HMO | Admitting: Family

## 2022-07-12 VITALS — BP 130/74 | HR 78 | Temp 97.8°F | Ht 63.0 in | Wt 182.2 lb

## 2022-07-12 DIAGNOSIS — Z8639 Personal history of other endocrine, nutritional and metabolic disease: Secondary | ICD-10-CM | POA: Diagnosis not present

## 2022-07-12 DIAGNOSIS — E118 Type 2 diabetes mellitus with unspecified complications: Secondary | ICD-10-CM

## 2022-07-12 DIAGNOSIS — I10 Essential (primary) hypertension: Secondary | ICD-10-CM

## 2022-07-12 DIAGNOSIS — J4521 Mild intermittent asthma with (acute) exacerbation: Secondary | ICD-10-CM

## 2022-07-12 DIAGNOSIS — R7309 Other abnormal glucose: Secondary | ICD-10-CM

## 2022-07-12 DIAGNOSIS — Z1231 Encounter for screening mammogram for malignant neoplasm of breast: Secondary | ICD-10-CM

## 2022-07-12 LAB — POCT GLYCOSYLATED HEMOGLOBIN (HGB A1C): Hemoglobin A1C: 5.9 % — AB (ref 4.0–5.6)

## 2022-07-12 MED ORDER — BUDESONIDE-FORMOTEROL FUMARATE 160-4.5 MCG/ACT IN AERO: 2.0000 | INHALATION_SPRAY | Freq: Two times a day (BID) | RESPIRATORY_TRACT | 3 refills | Status: DC

## 2022-07-12 NOTE — Patient Instructions (Signed)
Please call  and schedule your 3D mammogram and /or bone density scan as we discussed. ? ? [facility]  ( new location in 2023) ? ?[address], [place], [place] [address] ? ?[place], [place]  ?[phone] ? ? ?

## 2022-07-12 NOTE — Progress Notes (Unsigned)
Assessment & Plan:  Primary hypertension Assessment & Plan: Chronic, stable.  Continue losartan 50 mg, amlodipine 10 mg daily  Orders: -     Comprehensive metabolic panel; Future  Elevated glucose -     POCT glycosylated hemoglobin (Hb A1C)  History of vitamin D deficiency -     VITAMIN D 25 Hydroxy (Vit-D Deficiency, Fractures); Future  Mild intermittent asthma with exacerbation -     Budesonide-Formoterol Fumarate; Inhale 2 puffs into the lungs 2 (two) times daily.  Dispense: 1 each; Refill: 3  Encounter for screening mammogram for malignant neoplasm of breast -     3D Screening Mammogram, Left and Right; Future  DM (diabetes mellitus) with complications [facility]) Assessment & Plan: Lab Results  Component Value Date   HGBA1C 5.9 (A) 07/12/2022   Excellent control.  Will continue to monitor      Return precautions given.   Risks, benefits, and alternatives of the medications and treatment plan prescribed today were discussed, and patient expressed understanding.   Education regarding symptom management and diagnosis given to patient on AVS either electronically or printed.  Return in about 4 months (around 11/12/2022) for Fasting labs in 2-3 weeks.  Rennie Plowman, FNP  Subjective:    Patient ID: Diamond Hill, female    DOB: 09/08/1950, 72 y.o.   MRN: [id number]  CC: Diamond Hill is a 72 y.o. female who presents today for follow up.   HPI: Feels well today.  No new complaints.  She is been eating less carbohydrates.    Allergies: Patient has no known allergies. Current Outpatient Medications on File Prior to Visit  Medication Sig Dispense Refill   albuterol (VENTOLIN HFA) 108 (90 Base) MCG/ACT inhaler TAKE 2 PUFFS BY MOUTH EVERY 6 HOURS AS NEEDED FOR WHEEZE OR SHORTNESS OF BREATH 18 each 1   amLODipine (NORVASC) 10 MG tablet Take 1 tablet (10 mg total) by mouth daily. 90 tablet 1   b complex vitamins capsule Take 1 capsule by mouth daily.     cetirizine  (ZYRTEC) 10 MG chewable tablet Chew 10 mg by mouth daily.     fish oil-omega-3 fatty acids 1000 MG capsule Take 2 g by mouth daily.     fluticasone (FLONASE) 50 MCG/ACT nasal spray Place 2 sprays into both nostrils daily. 16 g 6   losartan (COZAAR) 50 MG tablet TAKE 1 TABLET BY MOUTH EVERY DAY (Patient not taking: Reported on 07/12/2022) 90 tablet 1   No current facility-administered medications on file prior to visit.    Review of Systems  Constitutional:  Negative for chills and fever.  Respiratory:  Negative for cough.   Cardiovascular:  Negative for chest pain and palpitations.  Gastrointestinal:  Negative for nausea and vomiting.      Objective:    BP 130/74   Pulse 78   Temp 97.8 F (36.6 C) (Oral)   Ht 5\' 3"  (1.6 m)   Wt 182 lb 3.2 oz (82.6 kg)   SpO2 97%   BMI 32.28 kg/m  BP Readings from Last 3 Encounters:  07/12/22 130/74  09/04/21 126/70  06/05/21 130/78   Wt Readings from Last 3 Encounters:  07/12/22 182 lb 3.2 oz (82.6 kg)  04/23/22 186 lb (84.4 kg)  09/04/21 186 lb (84.4 kg)    Physical Exam Vitals reviewed.  Constitutional:      Appearance: She is well-developed.  Eyes:     Conjunctiva/sclera: Conjunctivae normal.  Cardiovascular:     Rate and Rhythm:  Normal rate and regular rhythm.     Pulses: Normal pulses.     Heart sounds: Normal heart sounds.  Pulmonary:     Effort: Pulmonary effort is normal.     Breath sounds: Normal breath sounds. No wheezing, rhonchi or rales.  Skin:    General: Skin is warm and dry.  Neurological:     Mental Status: She is alert.  Psychiatric:        Speech: Speech normal.        Behavior: Behavior normal.        Thought Content: Thought content normal.

## 2022-07-13 ENCOUNTER — Encounter: Payer: Self-pay | Admitting: Family

## 2022-07-13 NOTE — Assessment & Plan Note (Signed)
Chronic, stable.  Continue losartan 50 mg, amlodipine 10 mg daily

## 2022-07-13 NOTE — Assessment & Plan Note (Signed)
Lab Results  Component Value Date   HGBA1C 5.9 (A) 07/12/2022   Excellent control.  Will continue to monitor

## 2022-07-27 ENCOUNTER — Other Ambulatory Visit (INDEPENDENT_AMBULATORY_CARE_PROVIDER_SITE_OTHER): Payer: Medicare HMO

## 2022-07-27 DIAGNOSIS — I10 Essential (primary) hypertension: Secondary | ICD-10-CM

## 2022-07-27 DIAGNOSIS — Z8639 Personal history of other endocrine, nutritional and metabolic disease: Secondary | ICD-10-CM | POA: Diagnosis not present

## 2022-07-27 LAB — COMPREHENSIVE METABOLIC PANEL
ALT: 16 U/L (ref 0–35)
AST: 17 U/L (ref 0–37)
Albumin: 4.3 g/dL (ref 3.5–5.2)
Alkaline Phosphatase: 53 U/L (ref 39–117)
BUN: 17 mg/dL (ref 6–23)
CO2: 29 mEq/L (ref 19–32)
Calcium: 9.6 mg/dL (ref 8.4–10.5)
Chloride: 103 mEq/L (ref 96–112)
Creatinine, Ser: 0.82 mg/dL (ref 0.40–1.20)
GFR: 72.75 mL/min (ref >60.00)
Glucose, Bld: 98 mg/dL (ref 70–99)
Potassium: 3.8 mEq/L (ref 3.5–5.1)
Sodium: 143 mEq/L (ref 135–145)
Total Bilirubin: 0.5 mg/dL (ref 0.2–1.2)
Total Protein: 7.6 g/dL (ref 6.0–8.3)

## 2022-07-27 LAB — VITAMIN D 25 HYDROXY (VIT D DEFICIENCY, FRACTURES): VITD: 24.36 ng/mL — ABNORMAL LOW (ref 30.00–100.00)

## 2022-08-27 ENCOUNTER — Telehealth: Payer: Self-pay

## 2022-08-27 DIAGNOSIS — E559 Vitamin D deficiency, unspecified: Secondary | ICD-10-CM

## 2022-08-27 NOTE — Telephone Encounter (Signed)
Lab ordered per lab result note.  

## 2022-10-21 ENCOUNTER — Encounter (INDEPENDENT_AMBULATORY_CARE_PROVIDER_SITE_OTHER): Payer: Self-pay

## 2022-11-12 ENCOUNTER — Ambulatory Visit: Payer: Medicare HMO | Admitting: Family

## 2022-11-16 ENCOUNTER — Encounter: Payer: Self-pay | Admitting: Family

## 2022-11-16 ENCOUNTER — Ambulatory Visit (INDEPENDENT_AMBULATORY_CARE_PROVIDER_SITE_OTHER): Payer: Medicare HMO | Admitting: Family

## 2022-11-16 VITALS — BP 136/74 | HR 90 | Temp 97.9°F | Ht 63.0 in | Wt 185.0 lb

## 2022-11-16 DIAGNOSIS — E118 Type 2 diabetes mellitus with unspecified complications: Secondary | ICD-10-CM

## 2022-11-16 DIAGNOSIS — I10 Essential (primary) hypertension: Secondary | ICD-10-CM | POA: Diagnosis not present

## 2022-11-16 DIAGNOSIS — J309 Allergic rhinitis, unspecified: Secondary | ICD-10-CM

## 2022-11-16 DIAGNOSIS — E559 Vitamin D deficiency, unspecified: Secondary | ICD-10-CM

## 2022-11-16 NOTE — Assessment & Plan Note (Signed)
Chronic, stable.  Continue losartan 50 mg, amlodipine 10 mg daily

## 2022-11-16 NOTE — Progress Notes (Signed)
Assessment & Plan:  Primary hypertension Assessment & Plan: Chronic, stable.  Continue losartan 50 mg, amlodipine 10 mg daily  Orders: -     CBC with Differential/Platelet; Future -     Lipid panel; Future  DM (diabetes mellitus) with complications (HCC) -     Microalbumin / creatinine urine ratio -     Hemoglobin A1c; Future  Vitamin D deficiency -     VITAMIN D 25 Hydroxy (Vit-D Deficiency, Fractures); Future  Allergic rhinitis, unspecified seasonality, unspecified trigger Assessment & Plan: Chronic, symptomatically stable.  Continue Symbicort 160-4.5 mcg, Zyrtec 10 mg daily, Flonase, and as needed albuterol inhaler      Return precautions given.   Risks, benefits, and alternatives of the medications and treatment plan prescribed today were discussed, and patient expressed understanding.   Education regarding symptom management and diagnosis given to patient on AVS either electronically or printed.  Return in about 6 months (around 05/16/2023) for Complete Physical Exam, Fasting labs in 2-3 weeks.  Rennie Plowman, FNP  Subjective:    Patient ID: Diamond Hill, female    DOB: 08-30-1950, 72 y.o.   MRN: [id number]  CC: Diamond Hill is a 72 y.o. female who presents today for follow up.   HPI: Feels well today.  No new complaints.  She feels asthma is well-controlled on regimen.  She is compliant with Symbicort daily.   Occasional use of albuterol.  She wears a mask when outside, or driving with the windows down which is particularly helpful.  Denies cough, shortness of breath, wheezing    Allergies: Patient has no known allergies. Current Outpatient Medications on File Prior to Visit  Medication Sig Dispense Refill   albuterol (VENTOLIN HFA) 108 (90 Base) MCG/ACT inhaler TAKE 2 PUFFS BY MOUTH EVERY 6 HOURS AS NEEDED FOR WHEEZE OR SHORTNESS OF BREATH 18 each 1   amLODipine (NORVASC) 10 MG tablet Take 1 tablet (10 mg total) by mouth daily. 90 tablet 1   b  complex vitamins capsule Take 1 capsule by mouth daily.     budesonide-formoterol (SYMBICORT) 160-4.5 MCG/ACT inhaler Inhale 2 puffs into the lungs 2 (two) times daily. 1 each 3   cetirizine (ZYRTEC) 10 MG chewable tablet Chew 10 mg by mouth daily.     fish oil-omega-3 fatty acids 1000 MG capsule Take 2 g by mouth daily.     fluticasone (FLONASE) 50 MCG/ACT nasal spray Place 2 sprays into both nostrils daily. 16 g 6   losartan (COZAAR) 50 MG tablet TAKE 1 TABLET BY MOUTH EVERY DAY 90 tablet 1   No current facility-administered medications on file prior to visit.    Review of Systems  Constitutional:  Negative for chills and fever.  Respiratory:  Negative for cough, shortness of breath and wheezing.   Cardiovascular:  Negative for chest pain and palpitations.  Gastrointestinal:  Negative for nausea and vomiting.      Objective:    BP 136/74   Pulse 90   Temp 97.9 F (36.6 C) (Oral)   Ht 5\' 3"  (1.6 m)   Wt 185 lb (83.9 kg)   SpO2 98%   BMI 32.77 kg/m  BP Readings from Last 3 Encounters:  11/16/22 136/74  07/12/22 130/74  09/04/21 126/70   Wt Readings from Last 3 Encounters:  11/16/22 185 lb (83.9 kg)  07/12/22 182 lb 3.2 oz (82.6 kg)  04/23/22 186 lb (84.4 kg)    Physical Exam Vitals reviewed.  Constitutional:  Appearance: She is well-developed.  Eyes:     Conjunctiva/sclera: Conjunctivae normal.  Cardiovascular:     Rate and Rhythm: Normal rate and regular rhythm.     Pulses: Normal pulses.     Heart sounds: Normal heart sounds.  Pulmonary:     Effort: Pulmonary effort is normal.     Breath sounds: Normal breath sounds. No wheezing, rhonchi or rales.  Skin:    General: Skin is warm and dry.  Neurological:     Mental Status: She is alert.  Psychiatric:        Speech: Speech normal.        Behavior: Behavior normal.        Thought Content: Thought content normal.

## 2022-11-16 NOTE — Patient Instructions (Signed)
Please let me know if vaginal bleeding persists   please call  and schedule your 3D mammogram and /or bone density scan as we discussed.   [facility]  ( new location in 2023)  [address], [place], [place] [address]  [place], [place]  [phone]   I have ordered transvaginal ultrasound.  Let us know if you dont hear back within a week in regards to an appointment being scheduled.   So that you are aware, if you are [facility] MyChart user , please pay attention to your MyChart messages as you may receive a MyChart message with a phone number to call and schedule this test/appointment own your own from our referral coordinator. This is a new process so I do not want you to miss this message.  If you are not a MyChart user, you will receive a phone call.   

## 2022-11-16 NOTE — Assessment & Plan Note (Signed)
Chronic, symptomatically stable.  Continue Symbicort 160-4.5 mcg, Zyrtec 10 mg daily, Flonase, and as needed albuterol inhaler

## 2022-11-17 ENCOUNTER — Other Ambulatory Visit: Payer: Self-pay | Admitting: Family

## 2022-11-17 DIAGNOSIS — I10 Essential (primary) hypertension: Secondary | ICD-10-CM

## 2022-12-03 ENCOUNTER — Other Ambulatory Visit (INDEPENDENT_AMBULATORY_CARE_PROVIDER_SITE_OTHER): Payer: Medicare HMO

## 2022-12-03 DIAGNOSIS — E118 Type 2 diabetes mellitus with unspecified complications: Secondary | ICD-10-CM | POA: Diagnosis not present

## 2022-12-03 DIAGNOSIS — I10 Essential (primary) hypertension: Secondary | ICD-10-CM | POA: Diagnosis not present

## 2022-12-03 DIAGNOSIS — E559 Vitamin D deficiency, unspecified: Secondary | ICD-10-CM | POA: Diagnosis not present

## 2022-12-03 LAB — CBC WITH DIFFERENTIAL/PLATELET
Basophils Absolute: 0.1 10*3/uL (ref 0.0–0.1)
Basophils Relative: 0.9 % (ref 0.0–3.0)
Eosinophils Absolute: 0.5 10*3/uL (ref 0.0–0.7)
Eosinophils Relative: 6.5 % — ABNORMAL HIGH (ref 0.0–5.0)
HCT: 41.7 % (ref 36.0–46.0)
Hemoglobin: 13.6 g/dL (ref 12.0–15.0)
Lymphocytes Relative: 49.4 % — ABNORMAL HIGH (ref 12.0–46.0)
Lymphs Abs: 3.8 10*3/uL (ref 0.7–4.0)
MCHC: 32.6 g/dL (ref 30.0–36.0)
MCV: 92.1 fL (ref 78.0–100.0)
Monocytes Absolute: 0.6 10*3/uL (ref 0.1–1.0)
Monocytes Relative: 8.1 % (ref 3.0–12.0)
Neutro Abs: 2.7 10*3/uL (ref 1.4–7.7)
Neutrophils Relative %: 35.1 % — ABNORMAL LOW (ref 43.0–77.0)
Platelets: 257 10*3/uL (ref 150.0–400.0)
RBC: 4.53 Mil/uL (ref 3.87–5.11)
RDW: 14.1 % (ref 11.5–15.5)
WBC: 7.8 10*3/uL (ref 4.0–10.5)

## 2022-12-03 LAB — LIPID PANEL
Cholesterol: 198 mg/dL (ref 0–200)
HDL: 100.8 mg/dL (ref >39.00)
LDL Cholesterol: 86 mg/dL (ref 0–99)
NonHDL: 96.81
Total CHOL/HDL Ratio: 2
Triglycerides: 52 mg/dL (ref 0.0–149.0)
VLDL: 10.4 mg/dL (ref 0.0–40.0)

## 2022-12-03 LAB — VITAMIN D 25 HYDROXY (VIT D DEFICIENCY, FRACTURES): VITD: 30.1 ng/mL (ref 30.00–100.00)

## 2022-12-03 LAB — HEMOGLOBIN A1C: Hgb A1c MFr Bld: 6.4 % (ref 4.6–6.5)

## 2022-12-03 LAB — COMPREHENSIVE METABOLIC PANEL
ALT: 16 U/L (ref 0–35)
AST: 16 U/L (ref 0–37)
Albumin: 4.2 g/dL (ref 3.5–5.2)
Alkaline Phosphatase: 55 U/L (ref 39–117)
BUN: 13 mg/dL (ref 6–23)
CO2: 30 meq/L (ref 19–32)
Calcium: 9.3 mg/dL (ref 8.4–10.5)
Chloride: 104 meq/L (ref 96–112)
Creatinine, Ser: 0.79 mg/dL (ref 0.40–1.20)
GFR: 75.89 mL/min (ref >60.00)
Glucose, Bld: 103 mg/dL — ABNORMAL HIGH (ref 70–99)
Potassium: 3.8 meq/L (ref 3.5–5.1)
Sodium: 142 meq/L (ref 135–145)
Total Bilirubin: 0.6 mg/dL (ref 0.2–1.2)
Total Protein: 7 g/dL (ref 6.0–8.3)

## 2022-12-17 ENCOUNTER — Other Ambulatory Visit: Payer: Self-pay

## 2022-12-17 DIAGNOSIS — E118 Type 2 diabetes mellitus with unspecified complications: Secondary | ICD-10-CM

## 2022-12-17 MED ORDER — ROSUVASTATIN CALCIUM 10 MG PO TABS
10.0000 mg | ORAL_TABLET | Freq: Every day | ORAL | 3 refills | Status: DC
Start: 2022-12-17 — End: 2023-05-16

## 2022-12-23 ENCOUNTER — Other Ambulatory Visit: Payer: Self-pay | Admitting: Family

## 2022-12-23 DIAGNOSIS — I10 Essential (primary) hypertension: Secondary | ICD-10-CM

## 2023-01-07 ENCOUNTER — Encounter: Payer: Self-pay | Admitting: Nurse Practitioner

## 2023-01-07 ENCOUNTER — Telehealth (INDEPENDENT_AMBULATORY_CARE_PROVIDER_SITE_OTHER): Payer: Medicare HMO | Admitting: Nurse Practitioner

## 2023-01-07 VITALS — BP 139/75 | Temp 99.1°F | Ht 63.0 in | Wt 185.0 lb

## 2023-01-07 DIAGNOSIS — J069 Acute upper respiratory infection, unspecified: Secondary | ICD-10-CM | POA: Diagnosis not present

## 2023-01-07 DIAGNOSIS — J309 Allergic rhinitis, unspecified: Secondary | ICD-10-CM

## 2023-01-07 MED ORDER — FLUTICASONE PROPIONATE 50 MCG/ACT NA SUSP: 2.0000 | Freq: Every day | NASAL | 6 refills | Status: AC

## 2023-01-07 NOTE — Assessment & Plan Note (Addendum)
Symptoms consistent with viral URI. Do not feel that antibiotics are warranted at this time. Advised Mucinex twice daily as needed for congestion. She politely declined medication for her cough. She will continue her nasal spray daily. Refills sent. She has an inhaler at home to use as needed. She can taking Tylenol or Ibuprofen as needed. Encouraged adequate fluid intake. Strict return precautions given to patient.

## 2023-01-07 NOTE — Progress Notes (Signed)
MyChart Video Visit    Virtual Visit via Video Note   This visit type was conducted because this format is felt to be most appropriate for this patient at this time. Physical exam was limited by quality of the video and audio technology used for the visit. CMA was able to get the patient set up on a video visit.  Patient location: Home. Patient and provider in visit Provider location: Office  I discussed the limitations of evaluation and management by telemedicine and the availability of in person appointments. The patient expressed understanding and agreed to proceed.  Visit Date: 01/07/2023  Today's healthcare provider: Bethanie Dicker, NP     Subjective:    Patient ID: Diamond Hill, female    DOB: 01-27-1951, 72 y.o.   MRN: [id number]  Chief Complaint  Patient presents with   Sinus Problem    Started: Wednesday  Low grade fever started Thursday: 99.9-100.1 Tested for covid: no Exposure to covid: no  Both ears ache Coughing up phlegm  wheezing    HPI Patient with symptoms that started 2 days ago. She also notes bilateral ear fullness. She feels that her cough is improving. Her mucus has loosened over the last 2 days.  Respiratory illness:  Cough- Yes, productive  Congestion-    Sinus- Yes   Chest- No  Post nasal drip- Yes  Sore throat- No  Shortness of breath- No  Fever- Yes, low grade  Fatigue/Myalgia- No Headache- No Nausea/Vomiting- No Taste disturbance- No  Smell disturbance- No  Covid exposure- No  Covid vaccination- x 4  Flu vaccination- Due  Medications- Nothing  Past Medical History:  Diagnosis Date   Blood in stool    Cancer (HCC) 2005   rectal, Dr. Su Hoff, Dr. Gean Quint, Dr. Lattie Corns, s/p XRT   Colon polyp    Diverticulitis    Heart murmur    Hypertension     Past Surgical History:  Procedure Laterality Date   ABDOMINAL HYSTERECTOMY  1993   HAS CERVIX; ovaries intact; hysterectomy for non cancercous reasons, due to fibroid.    rectal  cancer  2005   removal of lesion    Family History  Problem Relation Age of Onset   Cancer Mother        colon   Hyperlipidemia Mother    Hypertension Mother    Hyperlipidemia Father    Hypertension Father    Diabetes Father    Heart disease Father    Breast cancer Sister        stage 0   Hypertension Other     Social History   Socioeconomic History   Marital status: Divorced    Spouse name: Not on file   Number of children: Not on file   Years of education: Not on file   Highest education level: Not on file  Occupational History   Not on file  Tobacco Use   Smoking status: Never   Smokeless tobacco: Never  Substance and Sexual Activity   Alcohol use: Yes    Comment: occasional wine   Drug use: No   Sexual activity: Not on file  Other Topics Concern   Not on file  Social History Narrative   Lives in [place]. Separate from husband. 2 children.      She had 3 sisters , 3 brothers. One sister has passed 12/02/2021   Work - retired   Diet - Healthy   Exercise - none regular   Social Determinants of Health  Financial Resource Strain: Low Risk  (04/23/2022)   Overall Financial Resource Strain (CARDIA)    Difficulty of Paying Living Expenses: Not hard at all  Food Insecurity: No Food Insecurity (04/23/2022)   Hunger Vital Sign    Worried About Running Out of Food in the Last Year: Never true    Ran Out of Food in the Last Year: Never true  Transportation Needs: No Transportation Needs (04/23/2022)   PRAPARE - Administrator, Civil Service (Medical): No    Lack of Transportation (Non-Medical): No  Physical Activity: Unknown (04/18/2020)   Exercise Vital Sign    Days of Exercise per Week: 0 days    Minutes of Exercise per Session: Not on file  Stress: No Stress Concern Present (04/23/2022)   [employer] of Occupational Health - Occupational Stress Questionnaire    Feeling of Stress : Not at all  Social Connections: Unknown (04/23/2022)   Social  Connection and Isolation Panel [NHANES]    Frequency of Communication with Friends and Family: More than three times a week    Frequency of Social Gatherings with Friends and Family: More than three times a week    Attends Religious Services: More than 4 times per year    Active Member of [employer] or Organizations: Yes    Attends Banker Meetings: Not on file    Marital Status: Not on file  Intimate Partner Violence: Not At Risk (04/23/2022)   Humiliation, Afraid, Rape, and Kick questionnaire    Fear of Current or Ex-Partner: No    Emotionally Abused: No    Physically Abused: No    Sexually Abused: No    Outpatient Medications Prior to Visit  Medication Sig Dispense Refill   amLODipine (NORVASC) 10 MG tablet TAKE 1 TABLET BY MOUTH EVERY DAY 90 tablet 1   albuterol (VENTOLIN HFA) 108 (90 Base) MCG/ACT inhaler TAKE 2 PUFFS BY MOUTH EVERY 6 HOURS AS NEEDED FOR WHEEZE OR SHORTNESS OF BREATH 18 each 1   b complex vitamins capsule Take 1 capsule by mouth daily.     budesonide-formoterol (SYMBICORT) 160-4.5 MCG/ACT inhaler Inhale 2 puffs into the lungs 2 (two) times daily. 1 each 3   cetirizine (ZYRTEC) 10 MG chewable tablet Chew 10 mg by mouth daily.     fish oil-omega-3 fatty acids 1000 MG capsule Take 2 g by mouth daily.     losartan (COZAAR) 50 MG tablet TAKE 1 TABLET BY MOUTH EVERY DAY 90 tablet 1   rosuvastatin (CRESTOR) 10 MG tablet Take 1 tablet (10 mg total) by mouth daily. 90 tablet 3   fluticasone (FLONASE) 50 MCG/ACT nasal spray Place 2 sprays into both nostrils daily. 16 g 6   No facility-administered medications prior to visit.    No Known Allergies  ROS See HPI    Objective:    Physical Exam  BP 139/75 Comment: Pt reported  Temp 99.1 F (37.3 C) Comment: pt reported  Ht 5\' 3"  (1.6 m)   Wt 185 lb (83.9 kg)   BMI 32.77 kg/m  Wt Readings from Last 3 Encounters:  01/07/23 185 lb (83.9 kg)  11/16/22 185 lb (83.9 kg)  07/12/22 182 lb 3.2 oz (82.6 kg)    GENERAL: alert, oriented, appears well and in no acute distress   HEENT: atraumatic, conjunttiva clear, no obvious abnormalities on inspection of external nose and ears   NECK: normal movements of the head and neck   LUNGS: on inspection no signs of respiratory distress,  breathing rate appears normal, no obvious gross SOB, gasping or wheezing   CV: no obvious cyanosis   MS: moves all visible extremities without noticeable abnormality   PSYCH/NEURO: pleasant and cooperative, no obvious depression or anxiety, speech and thought processing grossly intact     Assessment & Plan:   Problem List Items Addressed This Visit       Respiratory   Allergic rhinitis   Relevant Medications   fluticasone (FLONASE) 50 MCG/ACT nasal spray   Viral upper respiratory tract infection - Primary    Symptoms consistent with viral URI. Do not feel that antibiotics are warranted at this time. Advised Mucinex twice daily as needed for congestion. She politely declined medication for her cough. She will continue her nasal spray daily. Refills sent. She has an inhaler at home to use as needed. She can taking Tylenol or Ibuprofen as needed. Encouraged adequate fluid intake. Strict return precautions given to patient.        I am having Diamond Hill maintain her fish oil-omega-3 fatty acids, b complex vitamins, cetirizine, albuterol, budesonide-formoterol, amLODipine, rosuvastatin, losartan, and fluticasone.  Meds ordered this encounter  Medications   fluticasone (FLONASE) 50 MCG/ACT nasal spray    Sig: Place 2 sprays into both nostrils daily.    Dispense:  16 g    Refill:  6    Order Specific Question:   Supervising Provider    Answer:   Birdie Sons, ERIC G [[id number]]    I discussed the assessment and treatment plan with the patient. The patient was provided an opportunity to ask questions and all were answered. The patient agreed with the plan and demonstrated an understanding of the instructions.    The patient was advised to call back or seek an in-person evaluation if the symptoms worsen or if the condition fails to improve as anticipated.   Bethanie Dicker, NP [facility] [phone] (phone) [phone] (fax)  [facility]

## 2023-02-10 ENCOUNTER — Telehealth: Payer: Self-pay | Admitting: Family

## 2023-02-10 NOTE — Telephone Encounter (Signed)
Patient need lab orders.

## 2023-02-11 ENCOUNTER — Other Ambulatory Visit (INDEPENDENT_AMBULATORY_CARE_PROVIDER_SITE_OTHER): Payer: Medicare HMO

## 2023-02-11 ENCOUNTER — Other Ambulatory Visit: Payer: Self-pay

## 2023-02-11 DIAGNOSIS — E785 Hyperlipidemia, unspecified: Secondary | ICD-10-CM | POA: Diagnosis not present

## 2023-02-11 DIAGNOSIS — I10 Essential (primary) hypertension: Secondary | ICD-10-CM

## 2023-02-11 LAB — COMPREHENSIVE METABOLIC PANEL
ALT: 14 U/L (ref 0–35)
AST: 15 U/L (ref 0–37)
Albumin: 4.3 g/dL (ref 3.5–5.2)
Alkaline Phosphatase: 53 U/L (ref 39–117)
BUN: 16 mg/dL (ref 6–23)
CO2: 31 meq/L (ref 19–32)
Calcium: 9.3 mg/dL (ref 8.4–10.5)
Chloride: 104 meq/L (ref 96–112)
Creatinine, Ser: 0.84 mg/dL (ref 0.40–1.20)
GFR: 70.4 mL/min (ref >60.00)
Glucose, Bld: 115 mg/dL — ABNORMAL HIGH (ref 70–99)
Potassium: 3.8 meq/L (ref 3.5–5.1)
Sodium: 143 meq/L (ref 135–145)
Total Bilirubin: 0.6 mg/dL (ref 0.2–1.2)
Total Protein: 7.5 g/dL (ref 6.0–8.3)

## 2023-02-11 LAB — CBC WITH DIFFERENTIAL/PLATELET
Basophils Absolute: 0.1 10*3/uL (ref 0.0–0.1)
Basophils Relative: 0.8 % (ref 0.0–3.0)
Eosinophils Absolute: 0.8 10*3/uL — ABNORMAL HIGH (ref 0.0–0.7)
Eosinophils Relative: 9.6 % — ABNORMAL HIGH (ref 0.0–5.0)
HCT: 43.7 % (ref 36.0–46.0)
Hemoglobin: 14.4 g/dL (ref 12.0–15.0)
Lymphocytes Relative: 48.3 % — ABNORMAL HIGH (ref 12.0–46.0)
Lymphs Abs: 3.9 10*3/uL (ref 0.7–4.0)
MCHC: 32.8 g/dL (ref 30.0–36.0)
MCV: 93.2 fL (ref 78.0–100.0)
Monocytes Absolute: 0.5 10*3/uL (ref 0.1–1.0)
Monocytes Relative: 6.7 % (ref 3.0–12.0)
Neutro Abs: 2.8 10*3/uL (ref 1.4–7.7)
Neutrophils Relative %: 34.6 % — ABNORMAL LOW (ref 43.0–77.0)
Platelets: 264 10*3/uL (ref 150.0–400.0)
RBC: 4.69 Mil/uL (ref 3.87–5.11)
RDW: 14.4 % (ref 11.5–15.5)
WBC: 8.1 10*3/uL (ref 4.0–10.5)

## 2023-02-11 LAB — LIPID PANEL
Cholesterol: 206 mg/dL — ABNORMAL HIGH (ref 0–200)
HDL: 86.7 mg/dL (ref >39.00)
LDL Cholesterol: 107 mg/dL — ABNORMAL HIGH (ref 0–99)
NonHDL: 119.22
Total CHOL/HDL Ratio: 2
Triglycerides: 61 mg/dL (ref 0.0–149.0)
VLDL: 12.2 mg/dL (ref 0.0–40.0)

## 2023-02-17 ENCOUNTER — Other Ambulatory Visit: Payer: Self-pay | Admitting: Family

## 2023-02-17 ENCOUNTER — Other Ambulatory Visit: Payer: Self-pay

## 2023-02-17 DIAGNOSIS — R899 Unspecified abnormal finding in specimens from other organs, systems and tissues: Secondary | ICD-10-CM

## 2023-02-17 DIAGNOSIS — I10 Essential (primary) hypertension: Secondary | ICD-10-CM

## 2023-02-17 DIAGNOSIS — E785 Hyperlipidemia, unspecified: Secondary | ICD-10-CM

## 2023-02-17 MED ORDER — ROSUVASTATIN CALCIUM 20 MG PO TABS: 20.0000 mg | ORAL_TABLET | Freq: Every day | ORAL | 0 refills | Status: DC

## 2023-03-31 ENCOUNTER — Other Ambulatory Visit: Payer: Medicare HMO

## 2023-04-08 ENCOUNTER — Other Ambulatory Visit (INDEPENDENT_AMBULATORY_CARE_PROVIDER_SITE_OTHER): Payer: Medicare HMO

## 2023-04-08 DIAGNOSIS — R899 Unspecified abnormal finding in specimens from other organs, systems and tissues: Secondary | ICD-10-CM | POA: Diagnosis not present

## 2023-04-08 DIAGNOSIS — I10 Essential (primary) hypertension: Secondary | ICD-10-CM | POA: Diagnosis not present

## 2023-04-08 DIAGNOSIS — E785 Hyperlipidemia, unspecified: Secondary | ICD-10-CM | POA: Diagnosis not present

## 2023-04-08 LAB — CBC WITH DIFFERENTIAL/PLATELET
Basophils Absolute: 0.1 10*3/uL (ref 0.0–0.1)
Basophils Relative: 1 % (ref 0.0–3.0)
Eosinophils Absolute: 0.5 10*3/uL (ref 0.0–0.7)
Eosinophils Relative: 7.2 % — ABNORMAL HIGH (ref 0.0–5.0)
HCT: 43.3 % (ref 36.0–46.0)
Hemoglobin: 14.2 g/dL (ref 12.0–15.0)
Lymphocytes Relative: 48.5 % — ABNORMAL HIGH (ref 12.0–46.0)
Lymphs Abs: 3.4 10*3/uL (ref 0.7–4.0)
MCHC: 32.8 g/dL (ref 30.0–36.0)
MCV: 92.3 fL (ref 78.0–100.0)
Monocytes Absolute: 0.6 10*3/uL (ref 0.1–1.0)
Monocytes Relative: 8.6 % (ref 3.0–12.0)
Neutro Abs: 2.5 10*3/uL (ref 1.4–7.7)
Neutrophils Relative %: 34.7 % — ABNORMAL LOW (ref 43.0–77.0)
Platelets: 271 10*3/uL (ref 150.0–400.0)
RBC: 4.69 Mil/uL (ref 3.87–5.11)
RDW: 14.1 % (ref 11.5–15.5)
WBC: 7.1 10*3/uL (ref 4.0–10.5)

## 2023-04-08 LAB — HEPATIC FUNCTION PANEL
ALT: 15 U/L (ref 0–35)
AST: 16 U/L (ref 0–37)
Albumin: 4.2 g/dL (ref 3.5–5.2)
Alkaline Phosphatase: 58 U/L (ref 39–117)
Bilirubin, Direct: 0.1 mg/dL (ref 0.0–0.3)
Total Bilirubin: 0.5 mg/dL (ref 0.2–1.2)
Total Protein: 7.3 g/dL (ref 6.0–8.3)

## 2023-04-08 LAB — LIPID PANEL
Cholesterol: 201 mg/dL — ABNORMAL HIGH (ref 0–200)
HDL: 91.7 mg/dL (ref >39.00)
LDL Cholesterol: 96 mg/dL (ref 0–99)
NonHDL: 109.14
Total CHOL/HDL Ratio: 2
Triglycerides: 64 mg/dL (ref 0.0–149.0)
VLDL: 12.8 mg/dL (ref 0.0–40.0)

## 2023-04-14 ENCOUNTER — Encounter: Payer: Self-pay | Admitting: Family

## 2023-04-26 ENCOUNTER — Ambulatory Visit (INDEPENDENT_AMBULATORY_CARE_PROVIDER_SITE_OTHER): Payer: Medicare HMO | Admitting: *Deleted

## 2023-04-26 VITALS — BP 124/75 | Ht 63.0 in | Wt 175.0 lb

## 2023-04-26 DIAGNOSIS — Z0000 Encounter for general adult medical examination without abnormal findings: Secondary | ICD-10-CM

## 2023-04-26 NOTE — Patient Instructions (Signed)
 Diamond Hill , Thank you for taking time to come for your Medicare Wellness Visit. I appreciate your ongoing commitment to your health goals. Please review the following plan we discussed and let me know if I can assist you in the future.   Referrals/Orders/Follow-Ups/Clinician Recommendations: Remember to call and schedule your diabetic eye exam. Consider updating your vaccines.  This is a list of the screening recommended for you and due dates:  Health Maintenance  Topic Date Due   Eye exam for diabetics  01/26/2022   Yearly kidney health urinalysis for diabetes  10/10/2022   DTaP/Tdap/Td vaccine (2 - Td or Tdap) 10/13/2022   COVID-19 Vaccine (5 - 2024-25 season) 11/07/2022   Colon Cancer Screening  08/02/2023   Flu Shot  06/06/2023*   Hemoglobin A1C  06/02/2023   Complete foot exam   07/12/2023   Mammogram  11/12/2023   Yearly kidney function blood test for diabetes  02/11/2024   Medicare Annual Wellness Visit  04/25/2024   DEXA scan (bone density measurement)  Completed   Hepatitis C Screening  Completed   HPV Vaccine  Aged Out   Pneumonia Vaccine  Discontinued   Zoster (Shingles) Vaccine  Discontinued  *Topic was postponed. The date shown is not the original due date.    Advanced directives: (Declined) Advance directive discussed with you today. Even though you declined this today, please call our office should you change your mind, and we can give you the proper paperwork for you to fill out.  Next Medicare Annual Wellness Visit scheduled for next year: Yes 05/01/24 @ 3:00

## 2023-04-26 NOTE — Progress Notes (Signed)
 Subjective:   Diamond Hill is a 73 y.o. female who presents for Medicare Annual (Subsequent) preventive examination.  Visit Complete: Virtual I connected with  Diamond Hill on 04/26/23 by a audio enabled telemedicine application and verified that I am speaking with the correct person using two identifiers.This patient declined Interactive audio and Acupuncturist. Therefore the visit was completed with audio only.   Patient Location: Home  Provider Location: Office/Clinic  I discussed the limitations of evaluation and management by telemedicine. The patient expressed understanding and agreed to proceed.  Vital Signs: Because this visit was a virtual/telehealth visit, some criteria may be missing or patient reported. Any vitals not documented were not able to be obtained and vitals that have been documented are patient reported.  Cardiac Risk Factors include: advanced age (>50men, >84 women);diabetes mellitus;dyslipidemia;hypertension;obesity (BMI >30kg/m2)     Objective:    Today's Vitals   04/26/23 1453  BP: 124/75  Weight: 175 lb (79.4 kg)  Height: 5\' 3"  (1.6 m)   Body mass index is 31 kg/m.     04/26/2023    3:14 PM 04/23/2022    2:35 PM 04/18/2020    2:16 PM  Advanced Directives  Does Patient Have a Medical Advance Directive? No No No  Would patient like information on creating a medical advance directive? No - Patient declined No - Patient declined No - Patient declined    Current Medications (verified) Outpatient Encounter Medications as of 04/26/2023  Medication Sig   albuterol (VENTOLIN HFA) 108 (90 Base) MCG/ACT inhaler TAKE 2 PUFFS BY MOUTH EVERY 6 HOURS AS NEEDED FOR WHEEZE OR SHORTNESS OF BREATH   amLODipine (NORVASC) 10 MG tablet TAKE 1 TABLET BY MOUTH EVERY DAY   budesonide-formoterol (SYMBICORT) 160-4.5 MCG/ACT inhaler Inhale 2 puffs into the lungs 2 (two) times daily.   cetirizine (ZYRTEC) 10 MG chewable tablet Chew 10 mg by mouth daily.    fluticasone (FLONASE) 50 MCG/ACT nasal spray Place 2 sprays into both nostrils daily.   losartan (COZAAR) 50 MG tablet TAKE 1 TABLET BY MOUTH EVERY DAY   rosuvastatin (CRESTOR) 20 MG tablet Take 1 tablet (20 mg total) by mouth daily. (Patient not taking: Reported on 04/26/2023)   b complex vitamins capsule Take 1 capsule by mouth daily. (Patient not taking: Reported on 04/26/2023)   fish oil-omega-3 fatty acids 1000 MG capsule Take 2 g by mouth daily. (Patient not taking: Reported on 04/26/2023)   rosuvastatin (CRESTOR) 10 MG tablet Take 1 tablet (10 mg total) by mouth daily. (Patient not taking: Reported on 04/26/2023)   No facility-administered encounter medications on file as of 04/26/2023.    Allergies (verified) Patient has no known allergies.   History: Past Medical History:  Diagnosis Date   Blood in stool    Cancer [facility]) 2005   rectal, Dr. Su Hoff, Dr. Gean Quint, Dr. Lattie Corns, s/p XRT   Colon polyp    Diverticulitis    Heart murmur    Hypertension    Past Surgical History:  Procedure Laterality Date   ABDOMINAL HYSTERECTOMY  1993   HAS CERVIX; ovaries intact; hysterectomy for non cancercous reasons, due to fibroid.    rectal cancer  2005   removal of lesion   Family History  Problem Relation Age of Onset   Cancer Mother        colon   Hyperlipidemia Mother    Hypertension Mother    Hyperlipidemia Father    Hypertension Father    Diabetes Father  Heart disease Father    Breast cancer Sister        stage 0   Hypertension Other    Social History   Socioeconomic History   Marital status: Divorced    Spouse name: Not on file   Number of children: Not on file   Years of education: Not on file   Highest education level: Not on file  Occupational History   Not on file  Tobacco Use   Smoking status: Never   Smokeless tobacco: Never  Substance and Sexual Activity   Alcohol use: Yes    Comment: occasional wine   Drug use: No   Sexual activity: Not on file  Other  Topics Concern   Not on file  Social History Narrative   Lives in [place]. Separate from husband. 2 children.      She had 3 sisters , 3 brothers. One sister has passed 12-06-2021   Work - retired   Diet - Healthy   Exercise - none regular   Social Drivers of Corporate investment banker Strain: Low Risk  (04/26/2023)   Overall Financial Resource Strain (CARDIA)    Difficulty of Paying Living Expenses: Not hard at all  Food Insecurity: No Food Insecurity (04/26/2023)   Hunger Vital Sign    Worried About Running Out of Food in the Last Year: Never true    Ran Out of Food in the Last Year: Never true  Transportation Needs: No Transportation Needs (04/26/2023)   PRAPARE - Administrator, Civil Service (Medical): No    Lack of Transportation (Non-Medical): No  Physical Activity: Inactive (04/26/2023)   Exercise Vital Sign    Days of Exercise per Week: 0 days    Minutes of Exercise per Session: 0 min  Stress: No Stress Concern Present (04/26/2023)   [employer] of Occupational Health - Occupational Stress Questionnaire    Feeling of Stress : Only a little  Social Connections: Moderately Isolated (04/26/2023)   Social Connection and Isolation Panel [NHANES]    Frequency of Communication with Friends and Family: More than three times a week    Frequency of Social Gatherings with Friends and Family: More than three times a week    Attends Religious Services: More than 4 times per year    Active Member of [employer] or Organizations: No    Attends Banker Meetings: Never    Marital Status: Separated    Tobacco Counseling Counseling given: Not Answered   Clinical Intake:  Pre-visit preparation completed: Yes  Pain : No/denies pain     BMI - recorded: 31 Nutritional Status: BMI > 30  Obese Nutritional Risks: None Diabetes: Yes CBG done?: No Did pt. bring in CBG monitor from home?: No  How often do you need to have someone help you when you read  instructions, pamphlets, or other written materials from your doctor or pharmacy?: 1 - Never  Interpreter Needed?: No  Information entered by :: R. Catrice Zuleta LPN   Activities of Daily Living    04/26/2023    3:03 PM  In your present state of health, do you have any difficulty performing the following activities:  Hearing? 0  Vision? 0  Difficulty concentrating or making decisions? 0  Walking or climbing stairs? 0  Dressing or bathing? 0  Doing errands, shopping? 0  Preparing Food and eating ? N  Using the Toilet? N  In the past six months, have you accidently leaked urine? N  Do you have problems with loss of bowel control? N  Managing your Medications? N  Managing your Finances? N  Housekeeping or managing your Housekeeping? N    Patient Care Team: Allegra Grana, FNP as PCP - General (Family Medicine)  Indicate any recent Medical Services you may have received from other than [facility] providers in the past year (date may be approximate).     Assessment:   This is a routine wellness examination for [facility].  Hearing/Vision screen Hearing Screening - Comments:: No issues Vision Screening - Comments:: readers   Goals Addressed             This Visit's Progress    Patient Stated       Wants to start back walking and exercising       Depression Screen    04/26/2023    3:09 PM 11/16/2022    8:08 AM 07/12/2022    9:15 AM 04/23/2022    2:37 PM 09/04/2021    4:01 PM 06/04/2021    3:13 PM 04/21/2021    2:19 PM  PHQ 2/9 Scores  PHQ - 2 Score 0 0 0 0 0 0 0  PHQ- 9 Score 1  2   0     Fall Risk    04/26/2023    3:04 PM 11/16/2022    8:07 AM 07/12/2022    9:15 AM 04/23/2022    2:36 PM 09/04/2021    4:01 PM  Fall Risk   Falls in the past year? 0 0 0 0 0  Number falls in past yr: 0 0 0 0 0  Injury with Fall? 0 0 0 0 0  Risk for fall due to : No Fall Risks No Fall Risks No Fall Risks  No Fall Risks  Follow up Falls prevention discussed;Falls evaluation completed Falls  evaluation completed Falls evaluation completed Falls evaluation completed;Falls prevention discussed Falls evaluation completed    MEDICARE RISK AT HOME: Medicare Risk at Home Any stairs in or around the home?: Yes If so, are there any without handrails?: Yes Home free of loose throw rugs in walkways, pet beds, electrical cords, etc?: Yes Adequate lighting in your home to reduce risk of falls?: Yes Life alert?: No Use of a cane, walker or w/c?: No Grab bars in the bathroom?: No Shower chair or bench in shower?: No Elevated toilet seat or a handicapped toilet?: No   Cognitive Function:        04/26/2023    3:15 PM 04/23/2022    3:04 PM  6CIT Screen  What Year? 0 points 0 points  What month? 0 points 0 points  What time? 0 points 0 points  Count back from 20 0 points 0 points  Months in reverse 0 points 0 points  Repeat phrase 0 points 0 points  Total Score 0 points 0 points    Immunizations Immunization History  Administered Date(s) Administered   Influenza Split 12/06/2013   Influenza-Unspecified 12/25/2012   Moderna Sars-Covid-2 Vaccination 05/05/2019, 06/02/2019, 02/28/2020, 10/10/2020   Tdap 10/12/2012    TDAP status: Due, Education has been provided regarding the importance of this vaccine. Advised may receive this vaccine at local pharmacy or Health Dept. Aware to provide a copy of the vaccination record if obtained from local pharmacy or Health Dept. Verbalized acceptance and understanding.  Flu Vaccine status: Declined, Education has been provided regarding the importance of this vaccine but patient still declined. Advised may receive this vaccine at local pharmacy or Health Dept.  Aware to provide a copy of the vaccination record if obtained from local pharmacy or Health Dept. Verbalized acceptance and understanding.  Pneumococcal vaccine status: Declined,  Education has been provided regarding the importance of this vaccine but patient still declined. Advised may  receive this vaccine at local pharmacy or Health Dept. Aware to provide a copy of the vaccination record if obtained from local pharmacy or Health Dept. Verbalized acceptance and understanding.   Covid-19 vaccine status: Declined, Education has been provided regarding the importance of this vaccine but patient still declined. Advised may receive this vaccine at local pharmacy or Health Dept.or vaccine clinic. Aware to provide a copy of the vaccination record if obtained from local pharmacy or Health Dept. Verbalized acceptance and understanding.  Qualifies for Shingles Vaccine? Yes   Zostavax completed No   Shingrix Completed?: No.    Education has been provided regarding the importance of this vaccine. Patient has been advised to call insurance company to determine out of pocket expense if they have not yet received this vaccine. Advised may also receive vaccine at local pharmacy or Health Dept. Verbalized acceptance and understanding.  Screening Tests Health Maintenance  Topic Date Due   OPHTHALMOLOGY EXAM  01/26/2022   Diabetic kidney evaluation - Urine ACR  10/10/2022   DTaP/Tdap/Td (2 - Td or Tdap) 10/13/2022   COVID-19 Vaccine (5 - 2024-25 season) 11/07/2022   Medicare Annual Wellness (AWV)  04/24/2023   Colonoscopy  08/02/2023   INFLUENZA VACCINE  06/06/2023 (Originally 10/07/2022)   HEMOGLOBIN A1C  06/02/2023   FOOT EXAM  07/12/2023   MAMMOGRAM  11/12/2023   Diabetic kidney evaluation - eGFR measurement  02/11/2024   DEXA SCAN  Completed   Hepatitis C Screening  Completed   HPV VACCINES  Aged Out   Pneumonia Vaccine 70+ Years old  Discontinued   Zoster Vaccines- Shingrix  Discontinued    Health Maintenance  Health Maintenance Due  Topic Date Due   OPHTHALMOLOGY EXAM  01/26/2022   Diabetic kidney evaluation - Urine ACR  10/10/2022   DTaP/Tdap/Td (2 - Td or Tdap) 10/13/2022   COVID-19 Vaccine (5 - 2024-25 season) 11/07/2022   Medicare Annual Wellness (AWV)  04/24/2023    Colonoscopy  08/02/2023    Colorectal cancer screening: Type of screening: Colonoscopy. Completed 07/2020. Repeat every 3 years  Mammogram status: Completed 11/2021. Repeat every year Patient gets Mammogram every 2 years and prefers this  Bone Density status: Completed 10/2020. Results reflect: Bone density results: NORMAL. Repeat every 2 years. Patient wants to discuss with PCP at next visit  Lung Cancer Screening: (Low Dose CT Chest recommended if Age 35-80 years, 20 pack-year currently smoking OR have quit w/in 15years.) does not qualify.     Additional Screening:  Hepatitis C Screening: does qualify; Completed 09/2015  Vision Screening: Recommended annual ophthalmology exams for early detection of glaucoma and other disorders of the eye. Is the patient up to date with their annual eye exam?  No  Who is the provider or what is the name of the office in which the patient attends annual eye exams? [facility]  Patient stated that she will call and schedule a diabetic eye exam If pt is not established with a provider, would they like to be referred to a provider to establish care? No .   Dental Screening: Recommended annual dental exams for proper oral hygiene  Diabetic Foot Exam: Diabetic Foot Exam: Completed 07/2022  Community Resource Referral / Chronic Care Management: CRR required this  visit?  No   CCM required this visit?  No     Plan:     I have personally reviewed and noted the following in the patient's chart:   Medical and social history Use of alcohol, tobacco or illicit drugs  Current medications and supplements including opioid prescriptions. Patient is not currently taking opioid prescriptions. Functional ability and status Nutritional status Physical activity Advanced directives List of other physicians Hospitalizations, surgeries, and ER visits in previous 12 months Vitals Screenings to include cognitive, depression, and falls Referrals and  appointments  In addition, I have reviewed and discussed with patient certain preventive protocols, quality metrics, and best practice recommendations. A written personalized care plan for preventive services as well as general preventive health recommendations were provided to patient.     Sydell Axon, LPN   [id number]   After Visit Summary: (MyChart) Due to this being a telephonic visit, the after visit summary with patients personalized plan was offered to patient via MyChart   Nurse Notes: see routing comments

## 2023-05-16 ENCOUNTER — Ambulatory Visit (INDEPENDENT_AMBULATORY_CARE_PROVIDER_SITE_OTHER): Payer: Medicare HMO | Admitting: Family

## 2023-05-16 ENCOUNTER — Encounter: Payer: Self-pay | Admitting: Family

## 2023-05-16 VITALS — BP 130/72 | HR 74 | Temp 98.3°F | Ht 63.0 in | Wt 175.4 lb

## 2023-05-16 DIAGNOSIS — R899 Unspecified abnormal finding in specimens from other organs, systems and tissues: Secondary | ICD-10-CM | POA: Diagnosis not present

## 2023-05-16 DIAGNOSIS — Z1231 Encounter for screening mammogram for malignant neoplasm of breast: Secondary | ICD-10-CM

## 2023-05-16 DIAGNOSIS — Z0000 Encounter for general adult medical examination without abnormal findings: Secondary | ICD-10-CM | POA: Diagnosis not present

## 2023-05-16 DIAGNOSIS — I10 Essential (primary) hypertension: Secondary | ICD-10-CM

## 2023-05-16 DIAGNOSIS — E118 Type 2 diabetes mellitus with unspecified complications: Secondary | ICD-10-CM | POA: Diagnosis not present

## 2023-05-16 DIAGNOSIS — Z780 Asymptomatic menopausal state: Secondary | ICD-10-CM | POA: Diagnosis not present

## 2023-05-16 LAB — MICROALBUMIN / CREATININE URINE RATIO
Creatinine,U: 168.4 mg/dL
Microalb Creat Ratio: 9.9 mg/g (ref 0.0–30.0)
Microalb, Ur: 1.7 mg/dL (ref 0.0–1.9)

## 2023-05-16 LAB — CBC WITH DIFFERENTIAL/PLATELET
Basophils Absolute: 0.1 10*3/uL (ref 0.0–0.1)
Basophils Relative: 1 % (ref 0.0–3.0)
Eosinophils Absolute: 0.5 10*3/uL (ref 0.0–0.7)
Eosinophils Relative: 7.7 % — ABNORMAL HIGH (ref 0.0–5.0)
HCT: 44.6 % (ref 36.0–46.0)
Hemoglobin: 14.5 g/dL (ref 12.0–15.0)
Lymphocytes Relative: 41.8 % (ref 12.0–46.0)
Lymphs Abs: 2.8 10*3/uL (ref 0.7–4.0)
MCHC: 32.4 g/dL (ref 30.0–36.0)
MCV: 92.8 fl (ref 78.0–100.0)
Monocytes Absolute: 0.5 10*3/uL (ref 0.1–1.0)
Monocytes Relative: 7.3 % (ref 3.0–12.0)
Neutro Abs: 2.8 10*3/uL (ref 1.4–7.7)
Neutrophils Relative %: 42.2 % — ABNORMAL LOW (ref 43.0–77.0)
Platelets: 270 10*3/uL (ref 150.0–400.0)
RBC: 4.81 Mil/uL (ref 3.87–5.11)
RDW: 14.1 % (ref 11.5–15.5)
WBC: 6.6 10*3/uL (ref 4.0–10.5)

## 2023-05-16 LAB — HEMOGLOBIN A1C: Hgb A1c MFr Bld: 6.3 % (ref 4.6–6.5)

## 2023-05-16 NOTE — Patient Instructions (Addendum)
 Colonoscopy is due with [facility], Dr Erenest Blank.   Please call his office to schedule.  If any issues in scheduling,  please let me know  Please call  and schedule your 3D mammogram and /or bone density scan as we discussed.   [facility]  ( new location in 2023)  [address], [place], [place] [address]  [place], [place]  [phone]   Health Maintenance for Postmenopausal Women Menopause is a normal process in which your ability to get pregnant comes to an end. This process happens slowly over many months or years, usually between the ages of 42 and 40. Menopause is complete when you have missed your menstrual period for 12 months. It is important to talk with your health care provider about some of the most common conditions that affect women after menopause (postmenopausal women). These include heart disease, cancer, and bone loss (osteoporosis). Adopting a healthy lifestyle and getting preventive care can help to promote your health and wellness. The actions you take can also lower your chances of developing some of these common conditions. What are the signs and symptoms of menopause? During menopause, you may have the following symptoms: Hot flashes. These can be moderate or severe. Night sweats. Decrease in sex drive. Mood swings. Headaches. Tiredness (fatigue). Irritability. Memory problems. Problems falling asleep or staying asleep. Talk with your health care provider about treatment options for your symptoms. Do I need hormone replacement therapy? Hormone replacement therapy is effective in treating symptoms that are caused by menopause, such as hot flashes and night sweats. Hormone replacement carries certain risks, especially as you become older. If you are thinking about using estrogen or estrogen with progestin, discuss the benefits and risks with your health care provider. How can I reduce my risk for heart disease and stroke? The risk of heart disease,  heart attack, and stroke increases as you age. One of the causes may be a change in the body's hormones during menopause. This can affect how your body uses dietary fats, triglycerides, and cholesterol. Heart attack and stroke are medical emergencies. There are many things that you can do to help prevent heart disease and stroke. Watch your blood pressure High blood pressure causes heart disease and increases the risk of stroke. This is more likely to develop in people who have high blood pressure readings or are overweight. Have your blood pressure checked: Every 3-5 years if you are 51-76 years of age. Every year if you are 77 years old or older. Eat a healthy diet  Eat a diet that includes plenty of vegetables, fruits, low-fat dairy products, and lean protein. Do not eat a lot of foods that are high in solid fats, added sugars, or sodium. Get regular exercise Get regular exercise. This is one of the most important things you can do for your health. Most adults should: Try to exercise for at least 150 minutes each week. The exercise should increase your heart rate and make you sweat (moderate-intensity exercise). Try to do strengthening exercises at least twice each week. Do these in addition to the moderate-intensity exercise. Spend less time sitting. Even light physical activity can be beneficial. Other tips Work with your health care provider to achieve or maintain a healthy weight. Do not use any products that contain nicotine or tobacco. These products include cigarettes, chewing tobacco, and vaping devices, such as e-cigarettes. If you need help quitting, ask your health care provider. Know your numbers. Ask your health care provider to check  your cholesterol and your blood sugar (glucose). Continue to have your blood tested as directed by your health care provider. Do I need screening for cancer? Depending on your health history and family history, you may need to have cancer screenings  at different stages of your life. This may include screening for: Breast cancer. Cervical cancer. Lung cancer. Colorectal cancer. What is my risk for osteoporosis? After menopause, you may be at increased risk for osteoporosis. Osteoporosis is a condition in which bone destruction happens more quickly than new bone creation. To help prevent osteoporosis or the bone fractures that can happen because of osteoporosis, you may take the following actions: If you are 41-48 years old, get at least 1,000 mg of calcium and at least 600 international units (IU) of vitamin D per day. If you are older than age 68 but younger than age 86, get at least 1,200 mg of calcium and at least 600 international units (IU) of vitamin D per day. If you are older than age 29, get at least 1,200 mg of calcium and at least 800 international units (IU) of vitamin D per day. Smoking and drinking excessive alcohol increase the risk of osteoporosis. Eat foods that are rich in calcium and vitamin D, and do weight-bearing exercises several times each week as directed by your health care provider. How does menopause affect my mental health? Depression may occur at any age, but it is more common as you become older. Common symptoms of depression include: Feeling depressed. Changes in sleep patterns. Changes in appetite or eating patterns. Feeling an overall lack of motivation or enjoyment of activities that you previously enjoyed. Frequent crying spells. Talk with your health care provider if you think that you are experiencing any of these symptoms. General instructions See your health care provider for regular wellness exams and vaccines. This may include: Scheduling regular health, dental, and eye exams. Getting and maintaining your vaccines. These include: Influenza vaccine. Get this vaccine each year before the flu season begins. Pneumonia vaccine. Shingles vaccine. Tetanus, diphtheria, and pertussis (Tdap) booster  vaccine. Your health care provider may also recommend other immunizations. Tell your health care provider if you have ever been abused or do not feel safe at home. Summary Menopause is a normal process in which your ability to get pregnant comes to an end. This condition causes hot flashes, night sweats, decreased interest in sex, mood swings, headaches, or lack of sleep. Treatment for this condition may include hormone replacement therapy. Take actions to keep yourself healthy, including exercising regularly, eating a healthy diet, watching your weight, and checking your blood pressure and blood sugar levels. Get screened for cancer and depression. Make sure that you are up to date with all your vaccines. This information is not intended to replace advice given to you by your health care provider. Make sure you discuss any questions you have with your health care provider. Document Revised: 07/14/2020 Document Reviewed: 07/14/2020 Elsevier Patient Education  2024 [employer].

## 2023-05-16 NOTE — Assessment & Plan Note (Signed)
 Congratulated patient on lifestyle changes.  She declines statin therapy.  Discussed risk ASCVD in the setting of diabetes, family history and re encouraged statin. Pending A1c

## 2023-05-16 NOTE — Assessment & Plan Note (Signed)
Chronic, stable.  Continue losartan 50 mg, amlodipine 10 mg daily

## 2023-05-16 NOTE — Progress Notes (Signed)
 Assessment & Plan:  Routine general medical examination at a health care facility Assessment & Plan: Deferred clinical breast exam due to patient preference.  She is no longer screening for cervical cancer.  She will call [facility] to schedule colonoscopy and declines referral for this.  She will schedule mammogram, bone density.  Encouraged walking program   DM (diabetes mellitus) with complications [facility]) Assessment & Plan: Congratulated patient on lifestyle changes.  She declines statin therapy.  Discussed risk ASCVD in the setting of diabetes, family history and re encouraged statin. Pending A1c  Orders: -     Microalbumin / creatinine urine ratio -     Hemoglobin A1c  Encounter for screening mammogram for malignant neoplasm of breast -     3D Screening Mammogram, Left and Right; Future  Asymptomatic postmenopausal state -     DG Bone Density; Future  Abnormal laboratory test -     CBC with Differential/Platelet  Primary hypertension Assessment & Plan: Chronic, stable.  Continue losartan 50 mg, amlodipine 10 mg daily      Return precautions given.   Risks, benefits, and alternatives of the medications and treatment plan prescribed today were discussed, and patient expressed understanding.   Education regarding symptom management and diagnosis given to patient on AVS either electronically or printed.  Return in about 4 months (around 09/15/2023).  Rennie Plowman, FNP  Subjective:    Patient ID: Diamond Hill, female    DOB: Jul 09, 1950, 73 y.o.   MRN: [id number]  CC: KYNLEIGH ARTZ is a 73 y.o. female who presents today for physical exam.    HPI: Feels well today No new complaints   She has stopped crestor and made dietary changes. She is pleased with 10 lb weight loss. She plans to start walking in warmer weather    Colorectal Cancer Screening: History of rectal carcinoma.  Colonoscopy is due.  She will schedule this at [facility] with Dr Erenest Blank, Dr Maren Beach.    Breast Cancer Screening: Mammogram UTD Cervical Cancer Screening: History of hysterectomy . She has her cervix, ovaries intact; hysterectomy for non cancercous reasons, due to fibroid.  Last Pap obtained 12/02/2017 regular malignancy, negative HPV  Bone Health screening/DEXA for 65+: due  Lung Cancer Screening: Doesn't have 20 year pack year history and age > 43 years yo 2 years       Tetanus - due        Pneumococcal - Candidate for; declines  Alcohol use: Occasional wine  Smoking/tobacco use: Nonsmoker.    Health Maintenance  Topic Date Due   Eye exam for diabetics  01/26/2022   Yearly kidney health urinalysis for diabetes  10/10/2022   DTaP/Tdap/Td vaccine (2 - Td or Tdap) 10/13/2022   COVID-19 Vaccine (5 - 2024-25 season) 11/07/2022   Colon Cancer Screening  08/02/2023   Flu Shot  06/06/2023*   Hemoglobin A1C  06/02/2023   Complete foot exam   07/12/2023   Mammogram  11/12/2023   Yearly kidney function blood test for diabetes  02/11/2024   Medicare Annual Wellness Visit  04/25/2024   DEXA scan (bone density measurement)  Completed   Hepatitis C Screening  Completed   HPV Vaccine  Aged Out   Pneumonia Vaccine  Discontinued   Zoster (Shingles) Vaccine  Discontinued  *Topic was postponed. The date shown is not the original due date.    ALLERGIES: Patient has no known allergies.  Current Outpatient Medications on File Prior to Visit  Medication Sig Dispense Refill  albuterol (VENTOLIN HFA) 108 (90 Base) MCG/ACT inhaler TAKE 2 PUFFS BY MOUTH EVERY 6 HOURS AS NEEDED FOR WHEEZE OR SHORTNESS OF BREATH 18 each 1   amLODipine (NORVASC) 10 MG tablet TAKE 1 TABLET BY MOUTH EVERY DAY 90 tablet 1   budesonide-formoterol (SYMBICORT) 160-4.5 MCG/ACT inhaler Inhale 2 puffs into the lungs 2 (two) times daily. 1 each 3   cetirizine (ZYRTEC) 10 MG chewable tablet Chew 10 mg by mouth daily.     fluticasone (FLONASE) 50 MCG/ACT nasal spray Place 2 sprays into both nostrils daily. 16 g 6    losartan (COZAAR) 50 MG tablet TAKE 1 TABLET BY MOUTH EVERY DAY 90 tablet 1   b complex vitamins capsule Take 1 capsule by mouth daily. (Patient not taking: Reported on 05/16/2023)     fish oil-omega-3 fatty acids 1000 MG capsule Take 2 g by mouth daily. (Patient not taking: Reported on 05/16/2023)     No current facility-administered medications on file prior to visit.    Review of Systems  Constitutional:  Negative for chills and fever.  Respiratory:  Negative for cough.   Cardiovascular:  Negative for chest pain and palpitations.  Gastrointestinal:  Negative for nausea and vomiting.      Objective:    BP 130/72   Pulse 74   Temp 98.3 F (36.8 C) (Oral)   Ht 5\' 3"  (1.6 m)   Wt 175 lb 6.4 oz (79.6 kg)   SpO2 98%   BMI 31.07 kg/m   BP Readings from Last 3 Encounters:  05/16/23 130/72  04/26/23 124/75  01/07/23 139/75   Wt Readings from Last 3 Encounters:  05/16/23 175 lb 6.4 oz (79.6 kg)  04/26/23 175 lb (79.4 kg)  01/07/23 185 lb (83.9 kg)    Physical Exam Vitals reviewed.  Constitutional:      Appearance: She is well-developed.  Eyes:     Conjunctiva/sclera: Conjunctivae normal.  Neck:     Thyroid: No thyroid mass or thyromegaly.  Cardiovascular:     Rate and Rhythm: Normal rate and regular rhythm.     Pulses: Normal pulses.     Heart sounds: Normal heart sounds.  Pulmonary:     Effort: Pulmonary effort is normal.     Breath sounds: Normal breath sounds. No wheezing, rhonchi or rales.  Lymphadenopathy:     Head:     Right side of head: No submental, submandibular, tonsillar, preauricular, posterior auricular or occipital adenopathy.     Left side of head: No submental, submandibular, tonsillar, preauricular, posterior auricular or occipital adenopathy.     Cervical: No cervical adenopathy.  Skin:    General: Skin is warm and dry.  Neurological:     Mental Status: She is alert.  Psychiatric:        Speech: Speech normal.        Behavior: Behavior  normal.        Thought Content: Thought content normal.

## 2023-05-16 NOTE — Assessment & Plan Note (Signed)
 Deferred clinical breast exam due to patient preference.  She is no longer screening for cervical cancer.  She will call [facility] to schedule colonoscopy and declines referral for this.  She will schedule mammogram, bone density.  Encouraged walking program

## 2023-05-17 ENCOUNTER — Other Ambulatory Visit: Payer: Self-pay | Admitting: Family

## 2023-05-17 ENCOUNTER — Encounter: Payer: Self-pay | Admitting: Family

## 2023-05-17 DIAGNOSIS — E785 Hyperlipidemia, unspecified: Secondary | ICD-10-CM

## 2023-05-29 ENCOUNTER — Other Ambulatory Visit: Payer: Self-pay | Admitting: Family

## 2023-05-29 DIAGNOSIS — D721 Eosinophilia, unspecified: Secondary | ICD-10-CM

## 2023-05-30 ENCOUNTER — Other Ambulatory Visit: Payer: Self-pay | Admitting: Family

## 2023-05-30 DIAGNOSIS — I10 Essential (primary) hypertension: Secondary | ICD-10-CM

## 2023-06-03 ENCOUNTER — Inpatient Hospital Stay: Attending: Internal Medicine | Admitting: Internal Medicine

## 2023-06-03 ENCOUNTER — Inpatient Hospital Stay

## 2023-06-03 ENCOUNTER — Encounter: Payer: Self-pay | Admitting: Internal Medicine

## 2023-06-03 VITALS — BP 173/87 | HR 87 | Temp 97.3°F | Resp 16 | Ht 63.0 in | Wt 175.6 lb

## 2023-06-03 DIAGNOSIS — Z85048 Personal history of other malignant neoplasm of rectum, rectosigmoid junction, and anus: Secondary | ICD-10-CM | POA: Insufficient documentation

## 2023-06-03 DIAGNOSIS — Z800 Family history of malignant neoplasm of digestive organs: Secondary | ICD-10-CM | POA: Insufficient documentation

## 2023-06-03 DIAGNOSIS — D721 Eosinophilia, unspecified: Secondary | ICD-10-CM | POA: Insufficient documentation

## 2023-06-03 DIAGNOSIS — Z803 Family history of malignant neoplasm of breast: Secondary | ICD-10-CM | POA: Insufficient documentation

## 2023-06-03 NOTE — Progress Notes (Signed)
 [facility] OFFICE PROGRESS NOTE  Patient Care Team: Allegra Grana, FNP as PCP - General (Family Medicine) Earna Coder, MD as Consulting Physician (Oncology)   # HEMATOLOGY HISTORY:  # LEUCOCYTOSIS- WBC-; N; L; Hb- platelets   Oncology History   No history exists.      INTERVAL HISTORY: Patient ambulating-independently.  Alone.    Diamond Hill 73 y.o.  female pleasant patient above history of rectal history [> 20 years- [facility]], HTN. Hx of asthma-currently on albuterol is here for initial consultation regarding elevated eosinophils.  Patient has had multiple episodes of sinus congestion; coughing wheezing in the last few months.  Patient has been recently started on albuterol inhaler which has not been helping.  Patient steroid inhaler was recently denied by insurance.  Allergies: pollen/ sinuses- congestion  Night sweats: none Intentional Weight loss: Early satiety:none  Unintentional weight loss:  Parasitic infections: NONE Splenectomy: NONE Steroids: NONE Smoke: never smoked.  Skin rash: none. [No hx of atopic dermatitis; ] Immunodeficiency/autoimmune disease: none  Review of Systems  Constitutional:  Negative for chills, diaphoresis, fever, malaise/fatigue and weight loss.  HENT:  Negative for nosebleeds and sore throat.   Eyes:  Negative for double vision.  Respiratory:  Negative for cough, hemoptysis, sputum production, shortness of breath and wheezing.   Cardiovascular:  Negative for chest pain, palpitations, orthopnea and leg swelling.  Gastrointestinal:  Negative for abdominal pain, blood in stool, constipation, diarrhea, heartburn, melena, nausea and vomiting.  Genitourinary:  Negative for dysuria, frequency and urgency.  Musculoskeletal:  Negative for back pain and joint pain.  Skin: Negative.  Negative for itching and rash.  Neurological:  Negative for dizziness, tingling, focal weakness, weakness and headaches.   Endo/Heme/Allergies:  Does not bruise/bleed easily.  Psychiatric/Behavioral:  Negative for depression. The patient is not nervous/anxious and does not have insomnia.       PAST MEDICAL HISTORY :  Past Medical History:  Diagnosis Date   Blood in stool    Cancer [facility]) 2005   rectal, Dr. Su Hoff, Dr. Gean Quint, Dr. Lattie Corns, s/p XRT   Colon polyp    Diverticulitis    Heart murmur    Hypertension     PAST SURGICAL HISTORY :   Past Surgical History:  Procedure Laterality Date   ABDOMINAL HYSTERECTOMY  1993   HAS CERVIX; ovaries intact; hysterectomy for non cancercous reasons, due to fibroid.    rectal cancer  2005   removal of lesion    FAMILY HISTORY :   Family History  Problem Relation Age of Onset   Cancer Mother        colon   Hyperlipidemia Mother    Hypertension Mother    Hyperlipidemia Father    Hypertension Father    Diabetes Father    Heart disease Father    Breast cancer Sister        stage 0   Heart attack Sister    Hypertension Other     SOCIAL HISTORY:   Social History   Tobacco Use   Smoking status: Never   Smokeless tobacco: Never  Vaping Use   Vaping status: Never Used  Substance Use Topics   Alcohol use: Not Currently   Drug use: No    ALLERGIES:  has no known allergies.  MEDICATIONS:  Current Outpatient Medications  Medication Sig Dispense Refill   albuterol (VENTOLIN HFA) 108 (90 Base) MCG/ACT inhaler TAKE 2 PUFFS BY MOUTH EVERY 6 HOURS AS NEEDED FOR WHEEZE OR SHORTNESS OF  BREATH 18 each 1   amLODipine (NORVASC) 10 MG tablet TAKE 1 TABLET BY MOUTH EVERY DAY 90 tablet 1   cetirizine (ZYRTEC) 10 MG chewable tablet Chew 10 mg by mouth daily.     fluticasone (FLONASE) 50 MCG/ACT nasal spray Place 2 sprays into both nostrils daily. 16 g 6   losartan (COZAAR) 50 MG tablet TAKE 1 TABLET BY MOUTH EVERY DAY 90 tablet 1   b complex vitamins capsule Take 1 capsule by mouth daily. (Patient not taking: Reported on 04/26/2023)     budesonide-formoterol  (SYMBICORT) 160-4.5 MCG/ACT inhaler Inhale 2 puffs into the lungs 2 (two) times daily. (Patient not taking: Reported on 06/03/2023) 1 each 3   fish oil-omega-3 fatty acids 1000 MG capsule Take 2 g by mouth daily. (Patient not taking: Reported on 04/26/2023)     No current facility-administered medications for this visit.    PHYSICAL EXAMINATION:  BP (!) 173/87 (BP Location: Left Arm, Patient Position: Sitting, Cuff Size: Normal)   Pulse 87   Temp (!) 97.3 F (36.3 C) (Tympanic)   Resp 16   Ht 5\' 3"  (1.6 m)   Wt 175 lb 9.6 oz (79.7 kg)   SpO2 98%   BMI 31.11 kg/m   Filed Weights   06/03/23 1051  Weight: 175 lb 9.6 oz (79.7 kg)    Physical Exam Vitals and nursing note reviewed.  HENT:     Head: Normocephalic and atraumatic.     Mouth/Throat:     Pharynx: Oropharynx is clear.  Eyes:     Extraocular Movements: Extraocular movements intact.     Pupils: Pupils are equal, round, and reactive to light.  Cardiovascular:     Rate and Rhythm: Normal rate and regular rhythm.  Pulmonary:     Comments: Decreased breath sounds bilaterally.  Abdominal:     Palpations: Abdomen is soft.  Musculoskeletal:        General: Normal range of motion.     Cervical back: Normal range of motion.  Skin:    General: Skin is warm.  Neurological:     General: No focal deficit present.     Mental Status: She is alert and oriented to person, place, and time.  Psychiatric:        Behavior: Behavior normal.        Judgment: Judgment normal.        LABORATORY DATA:  I have reviewed the data as listed    Component Value Date/Time   NA 143 02/11/2023 0758   K 3.8 02/11/2023 0758   CL 104 02/11/2023 0758   CO2 31 02/11/2023 0758   GLUCOSE 115 (H) 02/11/2023 0758   BUN 16 02/11/2023 0758   CREATININE 0.84 02/11/2023 0758   CALCIUM 9.3 02/11/2023 0758   PROT 7.3 04/08/2023 0907   ALBUMIN 4.2 04/08/2023 0907   AST 16 04/08/2023 0907   ALT 15 04/08/2023 0907   ALKPHOS 58 04/08/2023 0907    BILITOT 0.5 04/08/2023 0907    No results found for: "SPEP", "UPEP"  Lab Results  Component Value Date   WBC 6.6 05/16/2023   NEUTROABS 2.8 05/16/2023   HGB 14.5 05/16/2023   HCT 44.6 05/16/2023   MCV 92.8 05/16/2023   PLT 270.0 05/16/2023      Chemistry      Component Value Date/Time   NA 143 02/11/2023 0758   K 3.8 02/11/2023 0758   CL 104 02/11/2023 0758   CO2 31 02/11/2023 0758   BUN 16 02/11/2023  0758   CREATININE 0.84 02/11/2023 0758      Component Value Date/Time   CALCIUM 9.3 02/11/2023 0758   ALKPHOS 58 04/08/2023 0907   AST 16 04/08/2023 0907   ALT 15 04/08/2023 0907   BILITOT 0.5 04/08/2023 4098       RADIOGRAPHIC STUDIES: I have personally reviewed the radiological images as listed and agreed with the findings in the report. No results found.   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   #I had a long discussion with the patient regarding the multiple etiologies of elevated eosinophils including but not limited to reactive causes like infection like parasitic; allergies; medication; autoimmune/inflammation.  However other etiologies include malignancy/leukemia etc..  # Patient eosinophilia is likely from allergies-rather than any malignant process.  I would recommend holding off any workup for malignancy at this time.  However if patient's eosinophil/or white count worsens with other hematologic abnormalities-would recommend further workup for hematologic malignancy which would include CBC CMP LDH reviewed peripheral smear; peripheral blood flow cytometry.  BCR-ABL testing; also checking for hypereosinophilic syndrome-PDGF for mutation; CHEK re-arragements. However,  given unlikely any malignant/or hematolgic underlying disease process-I think it is reasonable to hold off any aggressive workup like bone marrow biopsy or further imaging at this time.  Recommend evaluation with allergy doctor or ENT  doctor.  However we will do a follow-up in 6 months making sure her eosinophils are not getting any worse.    # Hx of rectal cancer- >> 20 years- NED.  Continue surveillance colonoscopies as planned.  Thank you [name] for allowing me to participate in the care of your pleasant patient. Please do not hesitate to contact me with questions or concerns in the interim.   # 45 minutes face-to-face with the patient discussing the above plan of care; more than 50% of time spent on prognosis/ natural history; counseling and coordination.  The above plan of care was discussed with patient, and her provider, Ms. Arnett.  # DISPOSITION: # NO Labs today # Follow-up in 6 month - MD  labs-cbc/cmp; LDH-  Dr.B   Orders Placed This Encounter  Procedures   CBC with Differential (Cancer Center Only)    Standing Status:   Future    Expected Date:   12/04/2023    Expiration Date:   06/02/2024   CMP (Cancer Center only)    Standing Status:   Future    Expected Date:   12/04/2023    Expiration Date:   06/02/2024   Lactate dehydrogenase    Standing Status:   Future    Expected Date:   12/04/2023    Expiration Date:   06/02/2024   All questions were answered. The patient knows to call the clinic with any problems, questions or concerns.      Earna Coder, MD 06/03/2023 2:07 PM

## 2023-06-03 NOTE — Assessment & Plan Note (Addendum)
#   Elevated Eosinophilia-in the context of severe allergy.   #I had a long discussion with the patient regarding the multiple etiologies of elevated eosinophils including but not limited to reactive causes like infection like parasitic; allergies; medication; autoimmune/inflammation.  However other etiologies include malignancy/leukemia etc..  # Patient eosinophilia is likely from allergies-rather than any malignant process.  I would recommend holding off any workup for malignancy at this time.  However if patient's eosinophil/or white count worsens with other hematologic abnormalities-would recommend further workup for hematologic malignancy which would include CBC CMP LDH reviewed peripheral smear; peripheral blood flow cytometry.  BCR-ABL testing; also checking for hypereosinophilic syndrome-PDGF for mutation; CHEK re-arragements. However,  given unlikely any malignant/or hematolgic underlying disease process-I think it is reasonable to hold off any aggressive workup like bone marrow biopsy or further imaging at this time.  Recommend evaluation with allergy doctor or ENT doctor.  However we will do a follow-up in 6 months making sure her eosinophils are not getting any worse.    # Hx of rectal cancer- >> 20 years- NED.  Continue surveillance colonoscopies as planned.  Thank you [facility] for allowing me to participate in the care of your pleasant patient. Please do not hesitate to contact me with questions or concerns in the interim.   # 45 minutes face-to-face with the patient discussing the above plan of care; more than 50% of time spent on prognosis/ natural history; counseling and coordination.  The above plan of care was discussed with patient, and her provider, Diamond Hill.  # DISPOSITION: # NO Labs today # Follow-up in 6 month - MD  labs-cbc/cmp; LDH-  Dr.B

## 2023-11-23 ENCOUNTER — Other Ambulatory Visit: Payer: Self-pay | Admitting: Family

## 2023-11-23 DIAGNOSIS — I10 Essential (primary) hypertension: Secondary | ICD-10-CM

## 2023-12-05 ENCOUNTER — Inpatient Hospital Stay: Admitting: Internal Medicine

## 2023-12-05 ENCOUNTER — Other Ambulatory Visit

## 2023-12-06 NOTE — Progress Notes (Signed)
 Diamond Hill                                          MRN: [id number]   12/06/2023   The [facility] Quality Team Specialist reviewed this patient medical record for the purposes of chart review for care gap closure. The following were reviewed: chart review for care gap closure-kidney health evaluation for diabetes:eGFR  and uACR. No eGFR.    [facility] Quality Team

## 2023-12-22 ENCOUNTER — Telehealth: Payer: Self-pay

## 2023-12-22 ENCOUNTER — Encounter: Payer: Self-pay | Admitting: Family

## 2023-12-22 ENCOUNTER — Ambulatory Visit (INDEPENDENT_AMBULATORY_CARE_PROVIDER_SITE_OTHER): Admitting: Family

## 2023-12-22 VITALS — BP 132/80 | HR 76 | Temp 98.0°F | Ht 62.0 in | Wt 172.4 lb

## 2023-12-22 DIAGNOSIS — C20 Malignant neoplasm of rectum: Secondary | ICD-10-CM

## 2023-12-22 DIAGNOSIS — R7309 Other abnormal glucose: Secondary | ICD-10-CM

## 2023-12-22 DIAGNOSIS — I10 Essential (primary) hypertension: Secondary | ICD-10-CM | POA: Diagnosis not present

## 2023-12-22 DIAGNOSIS — R7303 Prediabetes: Secondary | ICD-10-CM

## 2023-12-22 DIAGNOSIS — J309 Allergic rhinitis, unspecified: Secondary | ICD-10-CM

## 2023-12-22 DIAGNOSIS — J4521 Mild intermittent asthma with (acute) exacerbation: Secondary | ICD-10-CM | POA: Diagnosis not present

## 2023-12-22 LAB — LIPID PANEL
Cholesterol: 229 mg/dL — ABNORMAL HIGH (ref 0–200)
HDL: 97.4 mg/dL (ref >39.00)
LDL Cholesterol: 120 mg/dL — ABNORMAL HIGH (ref 0–99)
NonHDL: 131.37
Total CHOL/HDL Ratio: 2
Triglycerides: 57 mg/dL (ref 0.0–149.0)
VLDL: 11.4 mg/dL (ref 0.0–40.0)

## 2023-12-22 LAB — COMPREHENSIVE METABOLIC PANEL WITH GFR
ALT: 14 U/L (ref 0–35)
AST: 15 U/L (ref 0–37)
Albumin: 4.5 g/dL (ref 3.5–5.2)
Alkaline Phosphatase: 58 U/L (ref 39–117)
BUN: 13 mg/dL (ref 6–23)
CO2: 27 meq/L (ref 19–32)
Calcium: 9.5 mg/dL (ref 8.4–10.5)
Chloride: 105 meq/L (ref 96–112)
Creatinine, Ser: 0.84 mg/dL (ref 0.40–1.20)
GFR: 69.98 mL/min (ref >60.00)
Glucose, Bld: 93 mg/dL (ref 70–99)
Potassium: 4.2 meq/L (ref 3.5–5.1)
Sodium: 143 meq/L (ref 135–145)
Total Bilirubin: 0.6 mg/dL (ref 0.2–1.2)
Total Protein: 7.3 g/dL (ref 6.0–8.3)

## 2023-12-22 LAB — POCT GLYCOSYLATED HEMOGLOBIN (HGB A1C): Hemoglobin A1C: 5.9 % — AB (ref 4.0–5.6)

## 2023-12-22 MED ORDER — ALBUTEROL SULFATE HFA 108 (90 BASE) MCG/ACT IN AERS: INHALATION_SPRAY | RESPIRATORY_TRACT | 2 refills | Status: AC

## 2023-12-22 NOTE — Assessment & Plan Note (Signed)
 Chronic, stable. Continue flonase , zyrtec and albuterol . Will follow.

## 2023-12-22 NOTE — Telephone Encounter (Signed)
 noted

## 2023-12-22 NOTE — Assessment & Plan Note (Signed)
 Chronic, stable. Continue to monitor.

## 2023-12-22 NOTE — Progress Notes (Signed)
 Assessment & Plan:  Elevated glucose -     POCT glycosylated hemoglobin (Hb A1C) -     Lipid panel  Primary hypertension Assessment & Plan: Chronic, stable.  Continue losartan  50 mg, amlodipine  10 mg daily  Orders: -     Comprehensive metabolic panel with GFR  Mild intermittent asthma with exacerbation -     Albuterol  Sulfate HFA; TAKE 2 PUFFS BY MOUTH EVERY 6 HOURS AS NEEDED FOR WHEEZE OR SHORTNESS OF BREATH  Dispense: 18 each; Refill: 2  Prediabetes Assessment & Plan: Chronic, stable. Continue to monitor.   Allergic rhinitis, unspecified seasonality, unspecified trigger Assessment & Plan: Chronic, stable. Continue flonase , zyrtec and albuterol . Will follow.    Rectal adenocarcinoma (HCC)     Return precautions given.   Risks, benefits, and alternatives of the medications and treatment plan prescribed today were discussed, and patient expressed understanding.   Education regarding symptom management and diagnosis given to patient on AVS either electronically or printed.  No follow-ups on file.  Rollene Northern, FNP  Subjective:    Patient ID: Diamond Hill, female    DOB: 1950-08-31, 73 y.o.   MRN: [id number]  CC: Diamond Hill is a 73 y.o. female who presents today for follow up.   HPI: She feels well today. No new complaints  She endorses eating less in portion sizes. She is happy for with weight loss.   Due colonoscopy  H/o rectal cancer.    Requests refill of albuterol  to keep on hand for occasional use. She is not using symbicort . Symptom are well controlled. Occasional wheezing , PND with weather changes; compliant with zyrtec which works well.   Denies SOB, CP   Allergies: Patient has no known allergies. Current Outpatient Medications on File Prior to Visit  Medication Sig Dispense Refill   amLODipine  (NORVASC ) 10 MG tablet TAKE 1 TABLET BY MOUTH EVERY DAY 90 tablet 1   cetirizine (ZYRTEC) 10 MG chewable tablet Chew 10 mg by mouth daily.      fluticasone  (FLONASE ) 50 MCG/ACT nasal spray Place 2 sprays into both nostrils daily. 16 g 6   losartan  (COZAAR ) 50 MG tablet TAKE 1 TABLET BY MOUTH EVERY DAY 90 tablet 1   No current facility-administered medications on file prior to visit.    Review of Systems  Constitutional:  Negative for chills and fever.  HENT:  Positive for postnasal drip. Negative for congestion.   Respiratory:  Positive for wheezing. Negative for cough and shortness of breath.   Cardiovascular:  Negative for chest pain and palpitations.  Gastrointestinal:  Negative for nausea and vomiting.      Objective:    BP 132/80   Pulse 76   Temp 98 F (36.7 C) (Oral)   Ht 5' 2 (1.575 m)   Wt 172 lb 6.4 oz (78.2 kg)   LMP  (LMP Unknown)   SpO2 97%   BMI 31.53 kg/m  BP Readings from Last 3 Encounters:  12/22/23 132/80  06/03/23 (!) 173/87  05/16/23 130/72   Wt Readings from Last 3 Encounters:  12/22/23 172 lb 6.4 oz (78.2 kg)  06/03/23 175 lb 9.6 oz (79.7 kg)  05/16/23 175 lb 6.4 oz (79.6 kg)    Physical Exam Vitals reviewed.  Constitutional:      Appearance: She is well-developed.  Eyes:     Conjunctiva/sclera: Conjunctivae normal.  Cardiovascular:     Rate and Rhythm: Normal rate and regular rhythm.     Pulses: Normal pulses.  Heart sounds: Normal heart sounds.  Pulmonary:     Effort: Pulmonary effort is normal.     Breath sounds: Normal breath sounds. No wheezing, rhonchi or rales.  Skin:    General: Skin is warm and dry.  Neurological:     Mental Status: She is alert.  Psychiatric:        Speech: Speech normal.        Behavior: Behavior normal.        Thought Content: Thought content normal.

## 2023-12-22 NOTE — Assessment & Plan Note (Signed)
Chronic, stable.  Continue losartan 50 mg, amlodipine 10 mg daily

## 2023-12-22 NOTE — Patient Instructions (Addendum)
 Again, it is very important that you schedule a follow up with [facility] for colonoscopy Please call Dr Brian Czito.  Please call  and schedule your 3D mammogram and /or bone density scan as we discussed.   [facility]  ( new location in 2023)  [address], [place], [place] [address]  [place], [place]  [phone]

## 2023-12-22 NOTE — Telephone Encounter (Signed)
 Patient saw Rollene Northern, FNP, today and in check-out note, Rollene states she would like to see patient again in four months for her CPE.  Patient had her last physical on 05/16/2023, so I scheduled her for a 62-month follow-up appointment on 04/25/2023.

## 2023-12-26 ENCOUNTER — Ambulatory Visit: Payer: Self-pay | Admitting: Family

## 2024-01-05 NOTE — Progress Notes (Signed)
 Diamond Hill                                          MRN: [id number]   01/05/2024   The [facility] Quality Team Specialist reviewed this patient medical record for the purposes of chart review for care gap closure. The following were reviewed: chart review for care gap closure-diabetic eye exam.    [facility] Quality Team

## 2024-01-16 ENCOUNTER — Other Ambulatory Visit: Payer: Self-pay | Admitting: Family

## 2024-01-16 DIAGNOSIS — I10 Essential (primary) hypertension: Secondary | ICD-10-CM

## 2024-01-27 NOTE — Progress Notes (Signed)
 Diamond Hill                                          MRN: [id number]   01/27/2024   The [facility] Quality Team Specialist reviewed this patient medical record for the purposes of chart review for care gap closure. The following were reviewed: chart review for care gap closure-kidney health evaluation for diabetes:eGFR  and uACR.    [facility] Quality Team

## 2024-03-16 ENCOUNTER — Ambulatory Visit: Payer: Self-pay | Admitting: Family

## 2024-03-16 NOTE — Telephone Encounter (Signed)
 Spoke to pt  offered to help her set virtual appt trough her my chart but she forgot her pw she states that if she does not feel better by tomorrow then she would go to [facility] walk in

## 2024-03-16 NOTE — Telephone Encounter (Signed)
 FYI Only or Action Required?: Action required by provider: Medication Request.  Patient was last seen in primary care on 12/22/2023 by Dineen Rollene MATSU, FNP.  Called Nurse Triage reporting Sinusitis.  Symptoms began several weeks ago.  Interventions attempted: OTC medications: Mucinex.  Symptoms are: gradually worsening.  Triage Disposition: See Physician Within 24 Hours  Patient/caregiver understands and will follow disposition?: No, wishes to speak with PCP   Message from Leah C sent at 03/16/2024  2:47 PM EST  Summary: Sinus infection possible   Reason for Triage: Fever 100.8, ears clogged, post nasal drip, cough but clear coming up. Patient is requesting if medicine can be called in.  [phone])      Reason for Disposition  Earache  Answer Assessment - Initial Assessment Questions 1. LOCATION: Where does it hurt?       Cheeks, nose, ears  2. ONSET: When did the sinus pain start?  (e.g., hours, days)      2 weeks  3. SEVERITY: How bad is the pain?   (Scale 0-10; or none, mild, moderate or severe)      Worse since onset  4. RECURRENT SYMPTOM: Have you ever had sinus problems before? If Yes, ask: When was the last time? and What happened that time?       Yes, Recurrent  5. NASAL CONGESTION: Is the nose blocked? If Yes, ask: Can you open it or must you breathe through your mouth?      yes  6. NASAL DISCHARGE: Do you have discharge from your nose? If so ask, What color?      Yes  7. FEVER: Do you have a fever? If Yes, ask: What is it, how was it measured, and when did it start?       Yes, 100.8 F. Just treated  8. OTHER SYMPTOMS:Pt denies sore throat cough, earache, difficulty breathing       Pt reports cough, earache  Pt reports cough, earache, and post nasal drip Pt is taking OTC Mucinex Pt requesting abx to be sent to pharmacy Pt agrees with plan of care, will call back for any worsening symptoms  Protocols used: Sinus Pain  or Congestion-A-AH

## 2024-03-20 NOTE — Telephone Encounter (Signed)
 Pt has been scheduled foe f/up on 04/24/24

## 2024-03-22 NOTE — Progress Notes (Signed)
 Diamond Hill                                          MRN: [id number]   03/22/2024   The [facility] Quality Team Specialist reviewed this patient medical record for the purposes of chart review for care gap closure. The following were reviewed: chart review for care gap closure-diabetic eye exam.    [facility] Quality Team

## 2024-03-28 NOTE — Progress Notes (Signed)
 Diamond Hill                                          MRN: [id number]   03/28/2024   The [facility] Quality Team Specialist reviewed this patient medical record for the purposes of chart review for care gap closure. The following were reviewed: chart review for care gap closure-breast cancer screening.    [facility]BCI Quality Team

## 2024-04-24 ENCOUNTER — Ambulatory Visit: Admitting: Family

## 2024-05-01 ENCOUNTER — Ambulatory Visit: Payer: Medicare HMO
